# Patient Record
Sex: Male | Born: 1960 | Race: White | Hispanic: No | Smoking: Never smoker
Health system: Southern US, Community
[De-identification: ages and names within clinical notes are randomized; demographics above are authoritative.]

## PROBLEM LIST (undated history)

## (undated) DIAGNOSIS — R51 Headache: Secondary | ICD-10-CM

## (undated) DIAGNOSIS — R1900 Intra-abdominal and pelvic swelling, mass and lump, unspecified site: Secondary | ICD-10-CM

## (undated) DIAGNOSIS — R519 Headache, unspecified: Secondary | ICD-10-CM

## (undated) DIAGNOSIS — K56609 Unspecified intestinal obstruction, unspecified as to partial versus complete obstruction: Secondary | ICD-10-CM

## (undated) DIAGNOSIS — I82409 Acute embolism and thrombosis of unspecified deep veins of unspecified lower extremity: Secondary | ICD-10-CM

## (undated) DIAGNOSIS — C189 Malignant neoplasm of colon, unspecified: Secondary | ICD-10-CM

---

## 1996-09-12 HISTORY — PX: KNEE ARTHROSCOPY: SHX127

## 2002-04-14 ENCOUNTER — Encounter: Payer: Self-pay | Admitting: *Deleted

## 2002-04-14 ENCOUNTER — Emergency Department (HOSPITAL_COMMUNITY): Admission: EM | Admit: 2002-04-14 | Discharge: 2002-04-14 | Payer: Self-pay | Admitting: *Deleted

## 2002-04-17 ENCOUNTER — Emergency Department (HOSPITAL_COMMUNITY): Admission: EM | Admit: 2002-04-17 | Discharge: 2002-04-17 | Payer: Self-pay | Admitting: Emergency Medicine

## 2015-06-27 ENCOUNTER — Ambulatory Visit (INDEPENDENT_AMBULATORY_CARE_PROVIDER_SITE_OTHER): Payer: Self-pay | Admitting: Family Medicine

## 2015-06-27 VITALS — BP 120/89 | HR 112 | Temp 97.9°F | Resp 20 | Ht 67.0 in | Wt 177.4 lb

## 2015-06-27 DIAGNOSIS — K529 Noninfective gastroenteritis and colitis, unspecified: Secondary | ICD-10-CM

## 2015-06-27 MED ORDER — DICYCLOMINE HCL 10 MG PO CAPS: 10.0000 mg | ORAL_CAPSULE | Freq: Three times a day (TID) | ORAL | Status: DC | PRN

## 2015-06-27 MED ORDER — ONDANSETRON 8 MG PO TBDP: 8.0000 mg | ORAL_TABLET | Freq: Three times a day (TID) | ORAL | Status: DC | PRN

## 2015-06-27 NOTE — Patient Instructions (Signed)

## 2015-06-27 NOTE — Progress Notes (Addendum)
@[facility] @  This chart was scribed for Robyn Haber, MD by Thea Alken, ED Scribe. This patient was seen in room 10 and the patient's care was started at 11:49 AM.  Patient ID: Jesse Murray MRN: [id number], DOB: 09-01-1961, 54 y.o. Date of Encounter: 06/27/2015, 11:54 AM  Primary Physician: No primary care provider on file.  Chief Complaint:  Chief Complaint  Patient presents with   GI Problem    C/O diarrhea, vomiting, & cramps since Thursday. Tried Pepto    HPI: 54 y.o. year old male with history below presents with abdominal cramping. Pt states for the past 2 days he's had abdominal cramping, bloating, nausea, emesis, watery diarrhea, fatigue and sweats. Pt has only being able to keep a couple gulps of water down. He has tried pepto bismol wihtout relief to symptoms. He's had sick contacts at work. He denies fever, cough, and urinary problems.    No past medical history on file.   Home Meds: Prior to Admission medications   Not on File    Allergies: No Known Allergies  Social History   Social History   Marital Status: Legally Separated    Spouse Name: N/A   Number of Children: N/A   Years of Education: N/A   Occupational History   Not on file.   Social History Main Topics   Smoking status: Never Smoker    Smokeless tobacco: Not on file   Alcohol Use: No   Drug Use: No   Sexual Activity: Not on file   Other Topics Concern   Not on file   Social History Narrative   No narrative on file     Review of Systems: Constitutional: negative for chills, fever, night sweats, weight changes, or fatigue  HEENT: negative for vision changes, hearing loss, congestion, rhinorrhea, ST, epistaxis, or sinus pressure Cardiovascular: negative for chest pain or palpitations Respiratory: negative for hemoptysis, wheezing, shortness of breath, or cough Abdominal: negative for constipation.  Dermatological: negative for rash Neurologic: negative for headache,  dizziness, or syncope All other systems reviewed and are otherwise negative with the exception to those above and in the HPI.   Physical Exam: Blood pressure 120/89, pulse 112, temperature 97.9 F (36.6 C), temperature source Oral, resp. rate 20, height 5\' 7"  (1.702 m), weight 177 lb 6 oz (80.457 kg), SpO2 98 %., Body mass index is 27.77 kg/(m^2). General: Well developed, well nourished, in no acute distress. Head: Normocephalic, atraumatic, eyes without discharge, sclera non-icteric, nares are without discharge. Bilateral auditory canals clear, TM's are without perforation, pearly grey and translucent with reflective cone of light bilaterally. Oral cavity moist, posterior pharynx without exudate, erythema, peritonsillar abscess, or post nasal drip.  Neck: Supple. No thyromegaly. Full ROM. No lymphadenopathy. Lungs: Clear bilaterally to auscultation without wheezes, rales, or rhonchi. Breathing is unlabored. Heart: RRR with S1 S2. No murmurs, rubs, or gallops appreciated. Abdomen: Soft, High pitched bowel sounds with some bloating. Minimally tender diffusely.  No hepatomegaly. No rebound/guarding. No obvious abdominal masses. Msk:  Strength and tone normal for age. Extremities/Skin: Warm and dry. No clubbing or cyanosis. No edema. No rashes or suspicious lesions. Neuro: Alert and oriented X 3. Moves all extremities spontaneously. Gait is normal. CNII-XII grossly in tact. Psych:  Responds to questions appropriately with a normal affect.   Labs:   ASSESSMENT AND PLAN:  54 y.o. year old male with acute nausea, vomiting and diarrhea.  Able to keep clear liquids down at least This chart was scribed in my presence  and reviewed by me personally.    ICD-9-CM ICD-10-CM   1. Acute gastroenteritis 558.9 K52.9 ondansetron (ZOFRAN-ODT) 8 MG disintegrating tablet     dicyclomine (BENTYL) 10 MG capsule    By signing my name below, I, Jesse Murray, attest that this documentation has been prepared under  the direction and in the presence of Robyn Haber, MD.  Electronically Signed: Thea Alken, ED Scribe. 06/27/2015. 12:01 PM.   Signed, Robyn Haber, MD 06/27/2015 11:54 AM

## 2015-07-14 ENCOUNTER — Ambulatory Visit (INDEPENDENT_AMBULATORY_CARE_PROVIDER_SITE_OTHER): Payer: Self-pay

## 2015-07-14 ENCOUNTER — Ambulatory Visit (INDEPENDENT_AMBULATORY_CARE_PROVIDER_SITE_OTHER): Payer: Self-pay | Admitting: Internal Medicine

## 2015-07-14 VITALS — BP 120/76 | HR 96 | Temp 97.7°F | Resp 20 | Ht 67.0 in | Wt 181.6 lb

## 2015-07-14 DIAGNOSIS — R1084 Generalized abdominal pain: Secondary | ICD-10-CM

## 2015-07-14 DIAGNOSIS — K5669 Other intestinal obstruction: Secondary | ICD-10-CM

## 2015-07-14 DIAGNOSIS — K56609 Unspecified intestinal obstruction, unspecified as to partial versus complete obstruction: Secondary | ICD-10-CM

## 2015-07-14 LAB — POCT CBC
Granulocyte percent: 71.3 %G (ref 37–80)
HCT, POC: 36.5 % — AB (ref 43.5–53.7)
HEMOGLOBIN: 12.3 g/dL — AB (ref 14.1–18.1)
Lymph, poc: 2.5 (ref 0.6–3.4)
MCH, POC: 29.2 pg (ref 27–31.2)
MCHC: 33.8 g/dL (ref 31.8–35.4)
MCV: 86.3 fL (ref 80–97)
MID (cbc): 0.7 (ref 0–0.9)
MPV: 6.9 fL (ref 0–99.8)
POC Granulocyte: 7.9 — AB (ref 2–6.9)
POC LYMPH PERCENT: 22.7 %L (ref 10–50)
POC MID %: 6 %M (ref 0–12)
Platelet Count, POC: 216 10*3/uL (ref 142–424)
RBC: 4.22 M/uL — AB (ref 4.69–6.13)
RDW, POC: 14.3 %
WBC: 11.1 10*3/uL — AB (ref 4.6–10.2)

## 2015-07-14 NOTE — Progress Notes (Signed)
Subjective:  This chart was scribed for Jesse Lin, MD by Leandra Kern, Medical Scribe. This patient was seen in Room 3 and the patient's care was started at 4:59 PM.   Patient ID: Jesse Murray, male    DOB: [birth year], 54 y.o.   MRN: [id number]  Chief Complaint  Patient presents with  . Abdominal Pain    constipation, pain, nausea , 2 week    HPI HPI Comments: Jesse Murray is a 54 y.o. male who presents to [facility] complaining of abdominal pain, onset 2 weeks ago.  Pt was recently seen here at the office by Dr. Joseph Art on 10/15 for vomiting and diarrhea. Pt was diagnosed with acute gastroenteritis and was prescribed ondasetrom and dicyclomine. Pt indicates that the medications have not given him any relief. He reports that he still has  nausea, with vomiting during the night time, and morning, however not in the day time.  Continues with cramping abdominal pain, and weight loss 14 lbs due to experiencing symptoms when eating/ appetite loss. He states that he is not able to sleep at night due to the abdominal cramps.   Pt denies  dysuria, diarrhea, bloody stool, fever, or trouble breathing.  The dicyclomine made him constipated. He has resumed bowel movements feels like he has incomplete emptying. No further diarrhea. 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   There are no active problems to display for this patient.  he avoids medical care because he is unsure History reviewed. No pertinent past medical history. History reviewed. No pertinent past surgical history. No Known Allergies Prior to Admission medications   Medication Sig Start Date End Date Taking? Authorizing Provider  dicyclomine (BENTYL) 10 MG capsule Take 1 capsule (10 mg total) by mouth 3 (three) times daily as needed for spasms. 06/27/15  Yes Robyn Haber, MD    ondansetron (ZOFRAN-ODT) 8 MG disintegrating tablet Take 1 tablet (8 mg total) by mouth every 8 (eight) hours as needed for nausea. Patient not taking: Reported on 07/14/2015 06/27/15   Robyn Haber, MD   Social History   Social History  . Marital Status: Legally Separated    Spouse Name: N/A  . Number of Children: N/A  . Years of Education: N/A   Occupational History  .  warehouse work.   Social History Main Topics  . Smoking status: Never Smoker   . Smokeless tobacco: Not on file  . Alcohol Use: No  . Drug Use: No  . Sexual Activity: Not on file   Other Topics Concern  . Not on file   Social History Narrative    Review of Systems  Constitutional: Positive for unexpected weight change. Negative for fever.  Respiratory: Negative for shortness of breath.   Gastrointestinal: Positive for nausea, vomiting and abdominal pain. Negative for diarrhea and blood in stool.  Genitourinary: Negative for dysuria.  Psychiatric/Behavioral: Positive for sleep disturbance.       Objective:   Physical Exam  Constitutional: He is oriented to person, place, and time. He appears well-developed and well-nourished. No distress.  HENT:  Head: Normocephalic and atraumatic.  Eyes: EOM are normal. Pupils are equal, round, and reactive to light.  Neck: Neck supple.  Cardiovascular: Normal rate, regular rhythm, normal heart sounds and intact distal pulses.   No murmur heard. Pulmonary/Chest: Effort normal and breath sounds  normal. No respiratory distress. He has no wheezes. He has no rales.  Abdominal: Soft. He exhibits distension. There is no rebound and no guarding.  borborrhygimi Non tender to palpation, no masses or organomegaly.  Very tympanitic  Neurological: He is alert and oriented to person, place, and time. No cranial nerve deficit.  Skin: Skin is warm and dry.  Psychiatric: He has a normal mood and affect. His behavior is normal.  Nursing note and vitals reviewed.   Results  for orders placed or performed in visit on 07/14/15  POCT CBC  Result Value Ref Range   WBC 11.1 (A) 4.6 - 10.2 K/uL   Lymph, poc 2.5 0.6 - 3.4   POC LYMPH PERCENT 22.7 10 - 50 %L   MID (cbc) 0.7 0 - 0.9   POC MID % 6.0 0 - 12 %M   POC Granulocyte 7.9 (A) 2 - 6.9   Granulocyte percent 71.3 37 - 80 %G   RBC 4.22 (A) 4.69 - 6.13 M/uL   Hemoglobin 12.3 (A) 14.1 - 18.1 g/dL   HCT, POC 36.5 (A) 43.5 - 53.7 %   MCV 86.3 80 - 97 fL   MCH, POC 29.2 27 - 31.2 pg   MCHC 33.8 31.8 - 35.4 g/dL   RDW, POC 14.3 %   Platelet Count, POC 216 142 - 424 K/uL   MPV 6.9 0 - 99.8 fL    [facility] (PRIMARY) x-ray report read by Dr. [name], MD: Abdomen- There are numerous air fluid levels on the upright film.    BP 120/76 mmHg  Pulse 96  Temp(Src) 97.7 F (36.5 C) (Oral)  Resp 20  Ht 5\' 7"  (1.702 m)  Wt 181 lb 9.6 oz (82.373 kg)  BMI 28.44 kg/m2  SpO2 99%     Assessment & Plan:  Generalized abdominal pain -with  Mild leukocytosis slight anemia  SBO (small bowel obstruction) (HCC)  secondary to intestinal infection and then dicyclomine  Partial, as he is having bowel movements  --- I recommended CT of the abdomen for complete diagnosis but he feels that he has  2 refused this because of the expense/// he also declines ER referral and referral for admission for the same reason  He will agree to start a clear fluids only diet in advance to full fluids slowly. He will not eat solids until he has no further cramping or distention. He will use glycerin suppositories to relieve any constipation. If he develops fever or worsening abdominal pain or is unable to hold down fluids he will go to the emergency room for further treatment. CMET and amylase pending  He certainly needs colonoscopy in f/u  But will wait until he is insured next year   By signing my name below, I, Rawaa Al Rifaie, attest that this documentation has been prepared under the direction and in the presence of Jesse Lin,  MD.  Leandra Kern, Medical Scribe. 07/14/2015.  5:07 PM.  I have completed the patient encounter in its entirety as documented by the scribe, with editing by me where necessary. Aneira Cavitt P. Laney Pastor, M.D.

## 2015-07-15 ENCOUNTER — Telehealth: Payer: Self-pay

## 2015-07-15 LAB — AMYLASE: AMYLASE: 35 U/L (ref 0–105)

## 2015-07-15 LAB — COMPREHENSIVE METABOLIC PANEL
ALT: 14 U/L (ref 9–46)
AST: 19 U/L (ref 10–35)
Albumin: 3.7 g/dL (ref 3.6–5.1)
Alkaline Phosphatase: 199 U/L — ABNORMAL HIGH (ref 40–115)
BUN: 16 mg/dL (ref 7–25)
CHLORIDE: 99 mmol/L (ref 98–110)
CO2: 25 mmol/L (ref 20–31)
Calcium: 8.6 mg/dL (ref 8.6–10.3)
Creat: 0.69 mg/dL — ABNORMAL LOW (ref 0.70–1.33)
Glucose, Bld: 89 mg/dL (ref 65–99)
Potassium: 4.7 mmol/L (ref 3.5–5.3)
Sodium: 137 mmol/L (ref 135–146)
Total Bilirubin: 0.8 mg/dL (ref 0.2–1.2)
Total Protein: 6.9 g/dL (ref 6.1–8.1)

## 2015-07-15 NOTE — Telephone Encounter (Signed)
Pt would like to speak with Dr Laney Pastor regarding his visit. Please call [phone]

## 2015-07-16 ENCOUNTER — Telehealth: Payer: Self-pay | Admitting: Family Medicine

## 2015-07-16 NOTE — Telephone Encounter (Signed)
LMOM to CB to get more details.

## 2015-07-16 NOTE — Telephone Encounter (Signed)
Left vmail. Alk phos elevated which can be attributed to his bowel obstruction. With his worsening of pain should go to the ER to avoid serious or fatal consequences.

## 2015-07-16 NOTE — Telephone Encounter (Signed)
Pt called to inquire about his labwork. He is still having a lot of cramping and stomach pain. Explained that the labwork needed to reviewed by a provider, as Dr. Laney Pastor will not be here today, and that we would be contacting him by the end of the day. Please advise.

## 2015-07-16 NOTE — Telephone Encounter (Signed)
Pt calling again asking about his labwork, he is in pain and wants to know what to do next. Please advise

## 2015-07-20 ENCOUNTER — Encounter: Payer: Self-pay | Admitting: Internal Medicine

## 2015-07-21 ENCOUNTER — Telehealth: Payer: Self-pay | Admitting: Internal Medicine

## 2015-07-21 NOTE — Telephone Encounter (Signed)
Pt does not have ins Left message on machine to call back

## 2015-07-21 NOTE — Telephone Encounter (Signed)
Any followup would be additional charge---but he might consider the [facility] to see if he qualifies for free care

## 2015-07-21 NOTE — Telephone Encounter (Signed)
Patient returned call about his lab results. There was not a clinical staff member available to help the patient so I gave him the following information from Dr. Laney Pastor: "Notes Recorded by Leandrew Koyanagi, MD on 07/20/2015 at 9:17 AM Call --labs ok --one liver /gall bladder test slightly high and needs repeating when he is well. If not well at this point needs recheck and possible CT abdomen."  **Patient understood his results however he is very concerned about his liver/gall test. He also wants to know if there will be an additional charge for him to come in and have a repeat test done. Can someone please call this patient to advise him? He requests that we call him back today at his work number [phone]. An alternate number is [phone].

## 2015-07-23 ENCOUNTER — Telehealth: Payer: Self-pay | Admitting: Internal Medicine

## 2015-07-23 NOTE — Telephone Encounter (Signed)
Left message advising pt. 

## 2015-07-23 NOTE — Telephone Encounter (Signed)
Please see previous phone message on this patient. He called screaming and frustrated that he has not received a phone call about his lab results. He states that he also received a bill in the mail and that he is not going to pay it until he speaks to someone about his results.   [phone]

## 2015-07-24 NOTE — Telephone Encounter (Signed)
Pt was notified of lab results this morning.

## 2015-07-29 ENCOUNTER — Telehealth: Payer: Self-pay | Admitting: Internal Medicine

## 2015-07-29 DIAGNOSIS — R1084 Generalized abdominal pain: Secondary | ICD-10-CM

## 2015-07-29 DIAGNOSIS — K56600 Partial intestinal obstruction, unspecified as to cause: Secondary | ICD-10-CM

## 2015-07-29 DIAGNOSIS — R748 Abnormal levels of other serum enzymes: Secondary | ICD-10-CM

## 2015-07-29 NOTE — Telephone Encounter (Signed)
Patient states that he needs antibiotics for an intestinal problem he is having. Please call patient to obtain more information. He did not want to speak with me about any of his concerns.   [phone]

## 2015-07-29 NOTE — Telephone Encounter (Signed)
Patient called again at 9:58am. He states that he is going to keep calling every hour until he is able to speak to Dr. Laney Pastor about this.

## 2015-07-29 NOTE — Telephone Encounter (Signed)
Pt is refusing any advice stating he knows what is wrong with him. I explained that xrys show blockage, he is adamant that it is NOT blockage and that he knows it is an infection and nothing else, He is requesting antibiotics. Dr. Laney Pastor spoke with him and advised a referral to gastroenterologist. Pt agreeable. Referral put in.

## 2015-07-29 NOTE — Telephone Encounter (Signed)
Called work and home both//left message at both He does not need antibiotic He has intestinal obstruction He needs to see ER for scan before anything else done He has no insurance and is refusing to go He has been offered the name of [facility] as lowcost clinic We can refer to gi if he wishes but this will take several days to get in so er best choice

## 2015-07-29 NOTE — Telephone Encounter (Signed)
See multiple calls And see last OV He wants an antibiotic called in but i say he needs gi eval for abd pain with an xray looking like partial obstruction and an elevated alk phos in someone who has avoided all routine care He reluctantly accepts this argument

## 2015-07-29 NOTE — Telephone Encounter (Addendum)
Patient called again at 10:56am requesting to speak with Dr. Laney Pastor. He said he either needs an antibiotic or a referral and that it better get done soon. Please advise.

## 2015-07-30 NOTE — Telephone Encounter (Signed)
The referral has been sent to [facility] work queue.  The referral is marked as urgent, and they will schedule based on that urgency.

## 2015-07-31 ENCOUNTER — Encounter: Payer: Self-pay | Admitting: Physician Assistant

## 2015-07-31 NOTE — Telephone Encounter (Signed)
Patient called again this morning about getting a GI referral. Please call back at [phone].

## 2015-08-19 ENCOUNTER — Ambulatory Visit: Payer: Self-pay | Admitting: Physician Assistant

## 2015-08-26 ENCOUNTER — Emergency Department (HOSPITAL_COMMUNITY): Payer: Medicaid Other

## 2015-08-26 ENCOUNTER — Inpatient Hospital Stay (HOSPITAL_COMMUNITY)
Admission: EM | Admit: 2015-08-26 | Discharge: 2015-10-14 | DRG: 329 | Disposition: E | Payer: Medicaid Other | Attending: Family Medicine | Admitting: Family Medicine

## 2015-08-26 ENCOUNTER — Encounter (HOSPITAL_COMMUNITY): Payer: Self-pay | Admitting: *Deleted

## 2015-08-26 DIAGNOSIS — Z808 Family history of malignant neoplasm of other organs or systems: Secondary | ICD-10-CM

## 2015-08-26 DIAGNOSIS — I639 Cerebral infarction, unspecified: Secondary | ICD-10-CM | POA: Diagnosis not present

## 2015-08-26 DIAGNOSIS — Z86718 Personal history of other venous thrombosis and embolism: Secondary | ICD-10-CM | POA: Diagnosis not present

## 2015-08-26 DIAGNOSIS — I9589 Other hypotension: Secondary | ICD-10-CM | POA: Diagnosis not present

## 2015-08-26 DIAGNOSIS — C259 Malignant neoplasm of pancreas, unspecified: Secondary | ICD-10-CM | POA: Diagnosis present

## 2015-08-26 DIAGNOSIS — Z7189 Other specified counseling: Secondary | ICD-10-CM

## 2015-08-26 DIAGNOSIS — C786 Secondary malignant neoplasm of retroperitoneum and peritoneum: Secondary | ICD-10-CM | POA: Diagnosis present

## 2015-08-26 DIAGNOSIS — Z932 Ileostomy status: Secondary | ICD-10-CM

## 2015-08-26 DIAGNOSIS — D62 Acute posthemorrhagic anemia: Secondary | ICD-10-CM | POA: Diagnosis not present

## 2015-08-26 DIAGNOSIS — I472 Ventricular tachycardia: Secondary | ICD-10-CM | POA: Diagnosis not present

## 2015-08-26 DIAGNOSIS — M79604 Pain in right leg: Secondary | ICD-10-CM

## 2015-08-26 DIAGNOSIS — I2699 Other pulmonary embolism without acute cor pulmonale: Secondary | ICD-10-CM | POA: Diagnosis present

## 2015-08-26 DIAGNOSIS — N179 Acute kidney failure, unspecified: Secondary | ICD-10-CM | POA: Diagnosis present

## 2015-08-26 DIAGNOSIS — K7290 Hepatic failure, unspecified without coma: Secondary | ICD-10-CM | POA: Diagnosis not present

## 2015-08-26 DIAGNOSIS — I82403 Acute embolism and thrombosis of unspecified deep veins of lower extremity, bilateral: Secondary | ICD-10-CM

## 2015-08-26 DIAGNOSIS — D649 Anemia, unspecified: Secondary | ICD-10-CM

## 2015-08-26 DIAGNOSIS — R410 Disorientation, unspecified: Secondary | ICD-10-CM

## 2015-08-26 DIAGNOSIS — R531 Weakness: Secondary | ICD-10-CM | POA: Insufficient documentation

## 2015-08-26 DIAGNOSIS — R29810 Facial weakness: Secondary | ICD-10-CM | POA: Diagnosis not present

## 2015-08-26 DIAGNOSIS — C800 Disseminated malignant neoplasm, unspecified: Secondary | ICD-10-CM

## 2015-08-26 DIAGNOSIS — C772 Secondary and unspecified malignant neoplasm of intra-abdominal lymph nodes: Secondary | ICD-10-CM | POA: Diagnosis present

## 2015-08-26 DIAGNOSIS — R188 Other ascites: Secondary | ICD-10-CM | POA: Diagnosis present

## 2015-08-26 DIAGNOSIS — D6489 Other specified anemias: Secondary | ICD-10-CM | POA: Diagnosis present

## 2015-08-26 DIAGNOSIS — K5669 Other intestinal obstruction: Principal | ICD-10-CM | POA: Diagnosis present

## 2015-08-26 DIAGNOSIS — I824Y9 Acute embolism and thrombosis of unspecified deep veins of unspecified proximal lower extremity: Secondary | ICD-10-CM | POA: Insufficient documentation

## 2015-08-26 DIAGNOSIS — D6869 Other thrombophilia: Secondary | ICD-10-CM | POA: Diagnosis present

## 2015-08-26 DIAGNOSIS — J189 Pneumonia, unspecified organism: Secondary | ICD-10-CM | POA: Diagnosis not present

## 2015-08-26 DIAGNOSIS — C787 Secondary malignant neoplasm of liver and intrahepatic bile duct: Secondary | ICD-10-CM | POA: Diagnosis present

## 2015-08-26 DIAGNOSIS — C19 Malignant neoplasm of rectosigmoid junction: Secondary | ICD-10-CM | POA: Diagnosis present

## 2015-08-26 DIAGNOSIS — G8324 Monoplegia of upper limb affecting left nondominant side: Secondary | ICD-10-CM | POA: Diagnosis not present

## 2015-08-26 DIAGNOSIS — E43 Unspecified severe protein-calorie malnutrition: Secondary | ICD-10-CM | POA: Diagnosis present

## 2015-08-26 DIAGNOSIS — E871 Hypo-osmolality and hyponatremia: Secondary | ICD-10-CM | POA: Diagnosis present

## 2015-08-26 DIAGNOSIS — R109 Unspecified abdominal pain: Secondary | ICD-10-CM | POA: Diagnosis present

## 2015-08-26 DIAGNOSIS — C801 Malignant (primary) neoplasm, unspecified: Secondary | ICD-10-CM

## 2015-08-26 DIAGNOSIS — Z801 Family history of malignant neoplasm of trachea, bronchus and lung: Secondary | ICD-10-CM

## 2015-08-26 DIAGNOSIS — Z781 Physical restraint status: Secondary | ICD-10-CM | POA: Diagnosis not present

## 2015-08-26 DIAGNOSIS — R0682 Tachypnea, not elsewhere classified: Secondary | ICD-10-CM | POA: Insufficient documentation

## 2015-08-26 DIAGNOSIS — K56609 Unspecified intestinal obstruction, unspecified as to partial versus complete obstruction: Secondary | ICD-10-CM

## 2015-08-26 DIAGNOSIS — G893 Neoplasm related pain (acute) (chronic): Secondary | ICD-10-CM | POA: Diagnosis present

## 2015-08-26 DIAGNOSIS — F05 Delirium due to known physiological condition: Secondary | ICD-10-CM | POA: Diagnosis present

## 2015-08-26 DIAGNOSIS — I82413 Acute embolism and thrombosis of femoral vein, bilateral: Secondary | ICD-10-CM | POA: Diagnosis present

## 2015-08-26 DIAGNOSIS — I739 Peripheral vascular disease, unspecified: Secondary | ICD-10-CM | POA: Diagnosis present

## 2015-08-26 DIAGNOSIS — Y95 Nosocomial condition: Secondary | ICD-10-CM | POA: Diagnosis not present

## 2015-08-26 DIAGNOSIS — C7972 Secondary malignant neoplasm of left adrenal gland: Secondary | ICD-10-CM | POA: Diagnosis present

## 2015-08-26 DIAGNOSIS — Z515 Encounter for palliative care: Secondary | ICD-10-CM

## 2015-08-26 DIAGNOSIS — M79605 Pain in left leg: Secondary | ICD-10-CM

## 2015-08-26 DIAGNOSIS — D696 Thrombocytopenia, unspecified: Secondary | ICD-10-CM | POA: Diagnosis not present

## 2015-08-26 DIAGNOSIS — C189 Malignant neoplasm of colon, unspecified: Secondary | ICD-10-CM | POA: Diagnosis present

## 2015-08-26 DIAGNOSIS — Z6829 Body mass index (BMI) 29.0-29.9, adult: Secondary | ICD-10-CM | POA: Diagnosis not present

## 2015-08-26 DIAGNOSIS — R1900 Intra-abdominal and pelvic swelling, mass and lump, unspecified site: Secondary | ICD-10-CM

## 2015-08-26 DIAGNOSIS — C799 Secondary malignant neoplasm of unspecified site: Secondary | ICD-10-CM

## 2015-08-26 DIAGNOSIS — R600 Localized edema: Secondary | ICD-10-CM

## 2015-08-26 DIAGNOSIS — Z66 Do not resuscitate: Secondary | ICD-10-CM | POA: Diagnosis not present

## 2015-08-26 DIAGNOSIS — L89152 Pressure ulcer of sacral region, stage 2: Secondary | ICD-10-CM | POA: Diagnosis not present

## 2015-08-26 DIAGNOSIS — C183 Malignant neoplasm of hepatic flexure: Secondary | ICD-10-CM | POA: Insufficient documentation

## 2015-08-26 DIAGNOSIS — I471 Supraventricular tachycardia: Secondary | ICD-10-CM | POA: Diagnosis not present

## 2015-08-26 DIAGNOSIS — C785 Secondary malignant neoplasm of large intestine and rectum: Secondary | ICD-10-CM | POA: Diagnosis present

## 2015-08-26 DIAGNOSIS — R64 Cachexia: Secondary | ICD-10-CM | POA: Diagnosis present

## 2015-08-26 DIAGNOSIS — Z933 Colostomy status: Secondary | ICD-10-CM

## 2015-08-26 DIAGNOSIS — R0902 Hypoxemia: Secondary | ICD-10-CM | POA: Diagnosis not present

## 2015-08-26 DIAGNOSIS — R509 Fever, unspecified: Secondary | ICD-10-CM

## 2015-08-26 HISTORY — DX: Acute embolism and thrombosis of unspecified deep veins of unspecified lower extremity: I82.409

## 2015-08-26 HISTORY — DX: Headache, unspecified: R51.9

## 2015-08-26 HISTORY — DX: Unspecified intestinal obstruction, unspecified as to partial versus complete obstruction: K56.609

## 2015-08-26 HISTORY — DX: Headache: R51

## 2015-08-26 HISTORY — DX: Intra-abdominal and pelvic swelling, mass and lump, unspecified site: R19.00

## 2015-08-26 HISTORY — DX: Malignant neoplasm of colon, unspecified: C18.9

## 2015-08-26 LAB — CBC WITH DIFFERENTIAL/PLATELET
BASOS ABS: 0.1 10*3/uL (ref 0.0–0.1)
BASOS PCT: 0 %
EOS ABS: 0.4 10*3/uL (ref 0.0–0.7)
Eosinophils Relative: 3 %
HEMATOCRIT: 29.4 % — AB (ref 39.0–52.0)
HEMOGLOBIN: 9.7 g/dL — AB (ref 13.0–17.0)
Lymphocytes Relative: 16 %
Lymphs Abs: 2.1 10*3/uL (ref 0.7–4.0)
MCH: 28.5 pg (ref 26.0–34.0)
MCHC: 33 g/dL (ref 30.0–36.0)
MCV: 86.5 fL (ref 78.0–100.0)
MONOS PCT: 6 %
Monocytes Absolute: 0.9 10*3/uL (ref 0.1–1.0)
NEUTROS ABS: 10.1 10*3/uL — AB (ref 1.7–7.7)
NEUTROS PCT: 75 %
Platelets: 185 10*3/uL (ref 150–400)
RBC: 3.4 MIL/uL — AB (ref 4.22–5.81)
RDW: 15.8 % — ABNORMAL HIGH (ref 11.5–15.5)
WBC: 13.6 10*3/uL — AB (ref 4.0–10.5)

## 2015-08-26 LAB — HEPARIN LEVEL (UNFRACTIONATED): HEPARIN UNFRACTIONATED: 0.11 [IU]/mL — AB (ref 0.30–0.70)

## 2015-08-26 LAB — APTT: APTT: 81 s — AB (ref 24–37)

## 2015-08-26 LAB — COMPREHENSIVE METABOLIC PANEL
ALK PHOS: 989 U/L — AB (ref 38–126)
ALT: 30 U/L (ref 17–63)
ANION GAP: 12 (ref 5–15)
AST: 49 U/L — ABNORMAL HIGH (ref 15–41)
Albumin: 3 g/dL — ABNORMAL LOW (ref 3.5–5.0)
BUN: 29 mg/dL — ABNORMAL HIGH (ref 6–20)
CALCIUM: 8.8 mg/dL — AB (ref 8.9–10.3)
CO2: 26 mmol/L (ref 22–32)
CREATININE: 1.31 mg/dL — AB (ref 0.61–1.24)
Chloride: 94 mmol/L — ABNORMAL LOW (ref 101–111)
Glucose, Bld: 113 mg/dL — ABNORMAL HIGH (ref 65–99)
Potassium: 3.9 mmol/L (ref 3.5–5.1)
SODIUM: 132 mmol/L — AB (ref 135–145)
TOTAL PROTEIN: 7.4 g/dL (ref 6.5–8.1)
Total Bilirubin: 1.9 mg/dL — ABNORMAL HIGH (ref 0.3–1.2)

## 2015-08-26 LAB — LIPASE, BLOOD: LIPASE: 31 U/L (ref 11–51)

## 2015-08-26 LAB — POC OCCULT BLOOD, ED: FECAL OCCULT BLD: NEGATIVE

## 2015-08-26 LAB — MAGNESIUM: MAGNESIUM: 2.2 mg/dL (ref 1.7–2.4)

## 2015-08-26 LAB — PROTIME-INR
INR: 3.21 — AB (ref 0.00–1.49)
PROTHROMBIN TIME: 32.2 s — AB (ref 11.6–15.2)

## 2015-08-26 LAB — PHOSPHORUS: Phosphorus: 4 mg/dL (ref 2.5–4.6)

## 2015-08-26 MED ORDER — ONDANSETRON HCL 4 MG/2ML IJ SOLN: 4.0000 mg | Freq: Four times a day (QID) | INTRAMUSCULAR | Status: DC | PRN

## 2015-08-26 MED ORDER — MORPHINE SULFATE (PF) 4 MG/ML IV SOLN: 4.0000 mg | INTRAVENOUS | Status: DC | PRN

## 2015-08-26 MED ORDER — ONDANSETRON HCL 4 MG/2ML IJ SOLN
4.0000 mg | Freq: Once | INTRAMUSCULAR | Status: AC
  Administered 2015-08-26: 4 mg via INTRAVENOUS
  Filled 2015-08-26: qty 2

## 2015-08-26 MED ORDER — IOHEXOL 300 MG/ML  SOLN
100.0000 mL | Freq: Once | INTRAMUSCULAR | Status: AC | PRN
  Administered 2015-08-26: 100 mL via INTRAVENOUS

## 2015-08-26 MED ORDER — DEXTROSE-NACL 5-0.9 % IV SOLN
INTRAVENOUS | Status: DC
  Administered 2015-08-26: 15:00:00 via INTRAVENOUS
  Administered 2015-08-27 (×2): 1000 mL via INTRAVENOUS
  Administered 2015-08-28 (×2): via INTRAVENOUS
  Administered 2015-08-29: 1000 mL via INTRAVENOUS

## 2015-08-26 MED ORDER — SODIUM CHLORIDE 0.9 % IV SOLN: INTRAVENOUS | Status: DC

## 2015-08-26 MED ORDER — HYDROMORPHONE HCL 1 MG/ML IJ SOLN
1.0000 mg | INTRAMUSCULAR | Status: DC | PRN
  Administered 2015-08-26 – 2015-08-27 (×6): 1 mg via INTRAVENOUS
  Filled 2015-08-26 (×6): qty 1

## 2015-08-26 MED ORDER — HEPARIN (PORCINE) IN NACL 100-0.45 UNIT/ML-% IJ SOLN
1700.0000 [IU]/h | INTRAMUSCULAR | Status: AC
  Administered 2015-08-26: 1200 [IU]/h via INTRAVENOUS
  Administered 2015-08-27: 1400 [IU]/h via INTRAVENOUS
  Administered 2015-08-27: 1700 [IU]/h via INTRAVENOUS
  Filled 2015-08-26 (×3): qty 250

## 2015-08-26 MED ORDER — ONDANSETRON HCL 4 MG PO TABS: 4.0000 mg | ORAL_TABLET | Freq: Four times a day (QID) | ORAL | Status: DC | PRN

## 2015-08-26 MED ORDER — PANTOPRAZOLE SODIUM 40 MG IV SOLR
40.0000 mg | Freq: Every day | INTRAVENOUS | Status: DC
  Administered 2015-08-26 – 2015-09-01 (×7): 40 mg via INTRAVENOUS
  Filled 2015-08-26 (×7): qty 40

## 2015-08-26 MED ORDER — MORPHINE SULFATE (PF) 4 MG/ML IV SOLN
4.0000 mg | Freq: Once | INTRAVENOUS | Status: AC
  Administered 2015-08-26: 4 mg via INTRAVENOUS
  Filled 2015-08-26: qty 1

## 2015-08-26 MED ORDER — MORPHINE SULFATE (PF) 2 MG/ML IV SOLN
2.0000 mg | INTRAVENOUS | Status: DC | PRN
  Administered 2015-08-26: 2 mg via INTRAVENOUS
  Filled 2015-08-26: qty 1

## 2015-08-26 MED ORDER — SODIUM CHLORIDE 0.9 % IV BOLUS (SEPSIS)
1000.0000 mL | Freq: Once | INTRAVENOUS | Status: AC
  Administered 2015-08-26: 1000 mL via INTRAVENOUS

## 2015-08-26 NOTE — Consult Note (Signed)
Reason for Consult:  Stage IV obstructing colon cancer Referring Physician: Dr. Davonna Belling  Jesse Murray is an 54 y.o. male.  HPI: Jesse Murray presents to the ED with abdominal pain RLQ and swelling from his feet to his testes.  Jesse Murray has had abdominal pain going back to 06/27/15.  Jesse Murray was treated for gastroenteritis.  Jesse Murray did not improve and returned to the Urgent Care on 07/14/15, with ongoing nausea, vomiting at night, abdominal cramping and weight loss.  Jesse Murray was referred for CT at that point but deferred because Jesse Murray had no way to pay for it.  Plain films obtained at that point showed:  Distended small bowel loops are noted with air-fluid levels highly suspicious for small bowel obstruction. No free abdominal air. Paucity of bowel gas within lower abdomen.  Jesse Murray has become progressively worse, and refused to come to the ED or go to [facility] up till now, because Jesse Murray had no insurance to pay for it.  Jesse Murray talked to a doctor in his church and it was thought Jesse Murray might have C diff colitis and Jesse Murray was given a week of Flagyl.  Jesse Murray said that made his symptoms worse.  Over the last 24 hours abdominal cramping and lower leg swelling has become worse.  This led him to come to the ED today.  Jesse Murray was also concerned about RLE/foot swelling a month ago which Jesse Murray attributed to a broken foot after Jesse Murray dropped a box of ammunition at work on his foot.  Jesse Murray has been in bed for the last 5-6 days because Jesse Murray felt so bad.  Jesse Murray reports a 30-40 pound weight loss since symptom started in October 2016.  Work up in the ED included films showing:  Findings worrisome for intestinal volvulus, potentially a cecal Volvulus.  CXR shows no acute changes. CT scan shows and obstructing tumor of the colon, hepatic flexure 55 x 66 x 46 mm.  Surrounding irregular mesenteric nodal disease. Associated ipsilateral and contralateral retroperitoneal nodal disease, up to 17 mm short axis at the level of the left main renal vessels. Widespread hepatic metastatic disease.  Hepatic metastases individually up to 5.5 cm diameter. This cecum is massively distended. The terminal ileum and distal small bowel are obstructed as expected and measure up to 4-1/2 cm diameter.  The tumor is inseparable from the second portion of the duodenum on coronal. The duodenum otherwise appears within normal limits. 11 mm hypodense lesion in the spleen. The pancreas is within normal limits. 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  Bilateral common femoral venous thrombosis suspected. Other major vascular structures appear patent.  Small indeterminate lung base nodules, indeterminate lesions in the spleen, and right posterior pelvic musculature.   Labs show Na 132, creatinine is up to 1.31 Alk phos is up to 989, bilirubin is up to 1.9.  WBC is up and Jesse Murray has mild anemia.  We are ask to see.  History reviewed. No pertinent past medical history.  History reviewed. No pertinent past surgical history.  History reviewed. No pertinent family history.  Social History:  reports that Jesse Murray has never smoked. Jesse Murray does not have any smokeless tobacco history on file. Jesse Murray reports that Jesse Murray does not drink alcohol or use illicit drugs.  Allergies: No Known Allergies  Prior to Admission medications   Not on File     Results for orders placed or performed during the hospital encounter of 08/27/2015 (from the past 48  hour(s))  Comprehensive metabolic panel     Status: Abnormal   Collection Time: 08/25/2015  9:40 AM  Result Value Ref Range   Sodium 132 (L) 135 - 145 mmol/L   Potassium 3.9 3.5 - 5.1 mmol/L   Chloride 94 (L) 101 - 111 mmol/L   CO2 26 22 - 32 mmol/L   Glucose, Bld 113 (H) 65 - 99 mg/dL   BUN 29 (H) 6 - 20 mg/dL   Creatinine, Ser 1.31 (H) 0.61 - 1.24 mg/dL   Calcium 8.8 (L) 8.9 - 10.3 mg/dL   Total Protein 7.4 6.5 - 8.1 g/dL   Albumin 3.0 (L) 3.5 - 5.0 g/dL   AST 49 (H) 15 - 41 U/L   ALT 30 17 - 63 U/L   Alkaline Phosphatase 989 (H) 38 - 126 U/L    Total Bilirubin 1.9 (H) 0.3 - 1.2 mg/dL   GFR calc non Af Amer >60 >60 mL/min   GFR calc Af Amer >60 >60 mL/min    Comment: (NOTE) The eGFR has been calculated using the CKD EPI equation. This calculation has not been validated in all clinical situations. eGFR's persistently <60 mL/min signify possible Chronic Kidney Disease.    Anion gap 12 5 - 15  CBC with Differential     Status: Abnormal   Collection Time: 08/28/2015  9:40 AM  Result Value Ref Range   WBC 13.6 (H) 4.0 - 10.5 K/uL   RBC 3.40 (L) 4.22 - 5.81 MIL/uL   Hemoglobin 9.7 (L) 13.0 - 17.0 g/dL   HCT 29.4 (L) 39.0 - 52.0 %   MCV 86.5 78.0 - 100.0 fL   MCH 28.5 26.0 - 34.0 pg   MCHC 33.0 30.0 - 36.0 g/dL   RDW 15.8 (H) 11.5 - 15.5 %   Platelets 185 150 - 400 K/uL   Neutrophils Relative % 75 %   Neutro Abs 10.1 (H) 1.7 - 7.7 K/uL   Lymphocytes Relative 16 %   Lymphs Abs 2.1 0.7 - 4.0 K/uL   Monocytes Relative 6 %   Monocytes Absolute 0.9 0.1 - 1.0 K/uL   Eosinophils Relative 3 %   Eosinophils Absolute 0.4 0.0 - 0.7 K/uL   Basophils Relative 0 %   Basophils Absolute 0.1 0.0 - 0.1 K/uL  Lipase, blood     Status: None   Collection Time: 08/20/2015  9:40 AM  Result Value Ref Range   Lipase 31 11 - 51 U/L  POC occult blood, ED RN will collect     Status: None   Collection Time: 08/29/2015 12:05 PM  Result Value Ref Range   Fecal Occult Bld NEGATIVE NEGATIVE    Dg Chest 2 View  09/06/2015  CLINICAL DATA:  54 year old male with right lower quadrant abdominal pain and lower extremity swelling since last week. Nausea and vomiting for 2 weeks. Weakness. Initial encounter. EXAM: CHEST  2 VIEW COMPARISON:  CT Abdomen and Pelvis from today reported separately, and earlier. FINDINGS: Lung volumes are within normal limits. Normal cardiac size and mediastinal contours. No pneumothorax or pulmonary edema. No pleural effusion or consolidation. No pulmonary nodule identified. No acute osseous abnormality identified. Abnormal bowel gas  in the abdomen, see comparison. IMPRESSION: No acute cardiopulmonary abnormality. See CT Abdomen and Pelvis from today reported separately. Electronically Signed   By: Genevie Ann M.D.   On: 08/16/2015 11:21   Dg Abd 1 View  08/23/2015  CLINICAL DATA:  Right abdominal pain and distention for 2 weeks. Constipation. EXAM: ABDOMEN -  1 VIEW COMPARISON:  07/14/2015 FINDINGS: There is marked distention of a loop of bowel, presumably colon. This finding is associated with a relative paucity of remaining bowel gas with a minimal amount of air seen within several loops of small bowel with index loop of small bowel in the left upper abdominal quadrant with index loop of presumably small bowel within the left upper abdominal quadrant measuring approximately 4.3 cm in diameter. Nondiagnostic evaluation for pneumoperitoneum secondary to exclusion of the lower thorax. No definite pneumatosis or portal venous gas. No definite abnormal intra-abdominal calcifications. IMPRESSION: Findings worrisome for intestinal volvulus, potentially a cecal volvulus. Further evaluation with abdominal CT could be performed as indicated. Critical Value/emergent results were called by telephone at the time of interpretation on 08/29/2015 at 10:04 am to Dr. Davonna Belling , who verbally acknowledged these results. Electronically Signed   By: Sandi Mariscal M.D.   On: 09/07/2015 10:07   Ct Abdomen Pelvis W Contrast  08/22/2015  ADDENDUM REPORT: 08/21/2015 11:52 ADDENDUM: Study discussed by telephone with Dr. Davonna Belling on 08/18/2015 at 1132 hours. Electronically Signed   By: Genevie Ann M.D.   On: 09/08/2015 11:52  09/12/2015  CLINICAL DATA:  53 year old male with abdominal pain for 2 weeks and lower extremity swelling greater on the right. Unintentional weight loss for 8 weeks. Abnormal bowel gas pattern today. Initial encounter. EXAM: CT ABDOMEN AND PELVIS WITH CONTRAST TECHNIQUE: Multidetector CT imaging of the abdomen and pelvis was  performed using the standard protocol following bolus administration of intravenous contrast. CONTRAST:  131m OMNIPAQUE IOHEXOL 300 MG/ML  SOLN COMPARISON:  Radiographs from today and earlier. FINDINGS: There are a small number of subtle lung base pulmonary nodules (left lung series 3, images 1 and 2). There is a 12 mm more irregular pulmonary nodule in the right lateral costophrenic angle. No pleural effusion. No pericardial effusion. No destructive or suspicious osseous lesion identified. However, there is a a suspicious small right posterior iliac muscle mass measuring 11 mm on series 2, image 69. No abdominal free air. There is trace abdominal free fluid primarily in the right gutter. There is an obstructing tumor of the colon at the hepatic flexure with indistinct margins suggesting mesenteric invasion and adjacent mesenteric nodal disease. The mass encompasses 55 x 66 x 46 mm (AP by transverse by CC). Surrounding irregular mesenteric nodal disease. Associated ipsilateral and contralateral retroperitoneal nodal disease, up to 17 mm short axis at the level of the left main renal vessels. Widespread hepatic metastatic disease. Hepatic metastases individually up to 5.5 cm diameter. This cecum is massively distended. The terminal ileum and distal small bowel are obstructed as expected and measure up to 4-1/2 cm diameter. Distal to the obstructing hepatic flexure tumor the colon is decompressed, with diverticulosis. Diminutive urinary bladder. Diminutive stomach. The tumor is inseparable from the second portion of the duodenum on coronal. The duodenum otherwise appears within normal limits. There is a small indeterminate 11 mm hypodense lesion in the spleen. The pancreas is within normal limits. 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 The kidneys are within normal limits. The portal venous system is patent. Major arterial structures in the abdomen and pelvis are patent.  There is an indistinct appearance of both common femoral veins in the pelvis on series 2, image 85. Internal all decreased density at both. No inguinal lymphadenopathy. The IVC is patent. The common iliac veins are patent on the delayed images. No pelvic  lymphadenopathy. IMPRESSION: 1. 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 Extensive hepatic metastases. Extensive nodal metastases. Left adrenal metastasis. 2. Severely obstructed cecum and distal small bowel. No free air. Trace free fluid. 3. Bilateral common femoral venous thrombosis suspected. Other major vascular structures appear patent. 4. Small indeterminate lung base nodules, indeterminate lesions in the spleen, and right posterior pelvic musculature. Electronically Signed: By: Odessa Fleming M.D. On: 08/21/2015 11:30    Review of Systems  Constitutional: Positive for fever (Jesse Murray isn't sure), chills (some) and weight loss (30-40 pounds since Oct 2016).  HENT: Negative for congestion, ear discharge, ear pain, hearing loss, nosebleeds, sore throat and tinnitus.   Eyes: Negative.   Respiratory: Positive for shortness of breath (DOE with walking 20 feet). Negative for cough, hemoptysis, sputum production, wheezing and stridor.   Cardiovascular: Positive for leg swelling (legs started swelling before Thanksgiving, and Jesse Murray thought it was from his Aleve).  Gastrointestinal: Positive for heartburn (occasionally), nausea, vomiting, abdominal pain (cramping that seems to come and go), diarrhea (Jesse Murray was treated by a doctor in his church with Flagyl for C diff, Jesse Murray says that made him worse.) and constipation (Jesse Murray started with diarrhea, and medicine for gastroenteritis made him worse.). Negative for blood in stool and melena.       Nausea and vomiting on and off since Oct 2016.  Worse if Jesse Murray eats in the PM.  Jesse Murray has never had a colonoscopy.  Genitourinary: Negative.   Musculoskeletal: Negative.   Skin: Negative.   Neurological: Positive for  headaches (headache now after news today.).  Endo/Heme/Allergies: Negative.   Psychiatric/Behavioral: Negative.    Blood pressure 115/75, pulse 96, temperature 97.9 F (36.6 C), temperature source Oral, height 5\' 8"  (1.727 m), weight 68.04 kg (150 lb), SpO2 100 %. Physical Exam  Constitutional: Jesse Murray is oriented to person, place, and time. Jesse Murray appears well-developed and well-nourished. No distress.  HENT:  Head: Normocephalic and atraumatic.  Nose: Nose normal.  Eyes: Conjunctivae and EOM are normal. Right eye exhibits no discharge. Left eye exhibits no discharge. No scleral icterus.  Neck: Neck supple. No JVD present. No tracheal deviation present. No thyromegaly present.  Cardiovascular: Regular rhythm, normal heart sounds and intact distal pulses.   No murmur heard. A little tachy when I saw him. BP 122/81 mmHg  Pulse 98  Temp(Src) 97.9 F (36.6 C) (Oral)  Ht 5\' 8"  (1.727 m)  Wt 68.04 kg (150 lb)  BMI 22.81 kg/m2  SpO2 100%   Respiratory: Effort normal and breath sounds normal. No respiratory distress. Jesse Murray has no wheezes. Jesse Murray has no rales. Jesse Murray exhibits no tenderness.  GI: Soft. Bowel sounds are normal. Jesse Murray exhibits distension. Jesse Murray exhibits no mass. There is no tenderness. There is no rebound and no guarding.  You can feel his cecum, Jesse Murray says it's like a big bubble that comes and goes.  Jesse Murray says Jesse Murray will pass some gas or hear gurgling and it will get smaller.  Genitourinary: Guaiac positive stool (this was sent by the ED physician and reported to me when they called for consult). No penile tenderness.  Penis and testes are not swollen  Musculoskeletal: Jesse Murray exhibits edema. Jesse Murray exhibits no tenderness.  Right leg is swollen, I don't think the left is very swollen at all.  Good distal pulses.  Lymphadenopathy:    Jesse Murray has no cervical adenopathy.  Neurological: Jesse Murray is alert and oriented to person, place, and time. No cranial nerve deficit.  Skin: Skin is warm  and dry. No rash noted. Jesse Murray is not  diaphoretic. No erythema. No pallor.  Psychiatric: Jesse Murray has a normal mood and affect. His behavior is normal. Judgment and thought content normal.    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    Plan:  i would agree with heparin for now.  NG placement for decompression.  Dr. Ninfa Linden will review the CT later this afternoon and make recommendations on Surgical plan.  We would most like try to get the tumor out, but it would depend on what is possible at time of surgery. Jesse Murray may just end up with some type of diverting colostomy.  I would get coag studies, CEA CA 19, AFP tonight. Keep him NPO.  Jesse Murray has not eaten much for over a month so Jesse Murray is significantly malnourished and deconditioned.  Jesse Murray 08/31/2015, 12:47 PM

## 2015-08-26 NOTE — ED Provider Notes (Signed)
CSN: [id number]     Arrival date & time 08/31/2015  0908 History   First MD Initiated Contact with Patient 08/29/2015 [phone]     Chief Complaint  Patient presents with  . Leg Swelling     (Consider location/radiation/quality/duration/timing/severity/associated sxs/prior Treatment) The history is provided by the patient.   patient presents with abdominal pain nausea vomiting some diarrhea. Also a swelling in his right leg. Around a month and a half ago he was seen in urgent care twice for the diarrhea and vomiting. On the second visit x-ray shows small bowel loops which was worrisome for bowel obstruction. Patient refused admission to hospital or CT scan. States since then his continue to do worse. His abdomen has felt full. States that over the last 2 months he has lost 50 pounds. States he cannot eat. Denies chest pain. Denies trouble breathing. States he is more fatigued. He does now have a swelling of the right leg also. His upper and lower leg. No history of this. No fevers. Denies blood in the stool.  Past Medical History  Diagnosis Date  . DVT (deep venous thrombosis) ([place]) dx'd ~ 08/22/2015    RLE  . Small bowel obstruction ([place]) 09/09/2015  . Abdominal mass 08/13/2015  . Headache     "weekly" (09/12/2015)  . Colon cancer ([place]) dx'd 08/25/2015    stage IV/notes 08/18/2015   Past Surgical History  Procedure Laterality Date  . Knee arthroscopy Left 1998    cartilage repair   Family History  Problem Relation Age of Onset  . Deep vein thrombosis Father 33  . Melanoma Father   . Lung cancer Paternal Grandfather   . Cirrhosis Paternal Grandfather   . Heart attack    . Diabetes     Social History  Substance Use Topics  . Smoking status: Never Smoker   . Smokeless tobacco: Never Used  . Alcohol Use: 0.0 oz/week    0 Standard drinks or equivalent per week     Comment: history of heavy alcohol abuse from 2003-2010; quit drinking in 2010    Review of Systems  Constitutional:  Positive for activity change, appetite change, fatigue and unexpected weight change.  Respiratory: Negative for shortness of breath.   Cardiovascular: Positive for leg swelling. Negative for chest pain.  Gastrointestinal: Positive for nausea, vomiting, abdominal pain, diarrhea and abdominal distention. Negative for blood in stool.  Genitourinary: Negative for flank pain.  Musculoskeletal: Negative for gait problem.  Skin: Positive for color change.  Neurological: Negative for weakness and light-headedness.  Psychiatric/Behavioral: Negative for confusion.      Allergies  Review of patient's allergies indicates no known allergies.  Home Medications   Prior to Admission medications   Not on File   BP 114/73 mmHg  Pulse 83  Temp(Src) 98 F (36.7 C) (Oral)  Resp 18  Ht 5\' 8"  (1.727 m)  Wt 160 lb 8 oz (72.802 kg)  BMI 24.41 kg/m2  SpO2 98% Physical Exam  Constitutional: He appears well-developed.  HENT:  Head: Atraumatic.  Eyes: Pupils are equal, round, and reactive to light.  Cardiovascular: Normal rate.   Pulmonary/Chest: Effort normal. He has no rales.  Abdominal: He exhibits distension. There is tenderness.  Somewhat diffuse fullness. Distention with upper tenderness. Patient states is worse in right upper quadrant. No hernias palpated.  Genitourinary: Penis normal.  No hernia palpated  Musculoskeletal: He exhibits edema.  Large on edema to right lower extremity. Goes from the groin all the way down to foot.  Skin: Skin is warm.    ED Course  Procedures (including critical care time) Labs Review Labs Reviewed  COMPREHENSIVE METABOLIC PANEL - Abnormal; Notable for the following:    Sodium 132 (*)    Chloride 94 (*)    Glucose, Bld 113 (*)    BUN 29 (*)    Creatinine, Ser 1.31 (*)    Calcium 8.8 (*)    Albumin 3.0 (*)    AST 49 (*)    Alkaline Phosphatase 989 (*)    Total Bilirubin 1.9 (*)    All other components within normal limits  CBC WITH  DIFFERENTIAL/PLATELET - Abnormal; Notable for the following:    WBC 13.6 (*)    RBC 3.40 (*)    Hemoglobin 9.7 (*)    HCT 29.4 (*)    RDW 15.8 (*)    Neutro Abs 10.1 (*)    All other components within normal limits  HEPARIN LEVEL (UNFRACTIONATED) - Abnormal; Notable for the following:    Heparin Unfractionated 0.11 (*)    All other components within normal limits  CEA - Abnormal; Notable for the following:    CEA 107.3 (*)    All other components within normal limits  APTT - Abnormal; Notable for the following:    aPTT 81 (*)    All other components within normal limits  PROTIME-INR - Abnormal; Notable for the following:    Prothrombin Time 32.2 (*)    INR 3.21 (*)    All other components within normal limits  CANCER ANTIGEN 19-9 - Abnormal; Notable for the following:    CA 19-9 29262 (*)    All other components within normal limits  CBC - Abnormal; Notable for the following:    RBC 3.02 (*)    Hemoglobin 8.4 (*)    HCT 26.5 (*)    RDW 16.0 (*)    All other components within normal limits  PREALBUMIN - Abnormal; Notable for the following:    Prealbumin 7.6 (*)    All other components within normal limits  BASIC METABOLIC PANEL - Abnormal; Notable for the following:    Glucose, Bld 114 (*)    Calcium 7.9 (*)    All other components within normal limits  HEPARIN LEVEL (UNFRACTIONATED) - Abnormal; Notable for the following:    Heparin Unfractionated 0.17 (*)    All other components within normal limits  PROTIME-INR - Abnormal; Notable for the following:    Prothrombin Time 27.8 (*)    INR 2.64 (*)    All other components within normal limits  HEPATIC FUNCTION PANEL - Abnormal; Notable for the following:    Total Protein 5.3 (*)    Albumin 2.2 (*)    Alkaline Phosphatase 702 (*)    Total Bilirubin 1.6 (*)    Bilirubin, Direct 0.9 (*)    All other components within normal limits  LIPASE, BLOOD  MAGNESIUM  PHOSPHORUS  AFP TUMOR MARKER  HEPARIN LEVEL (UNFRACTIONATED)   CBC  PROTIME-INR  POC OCCULT BLOOD, ED  TYPE AND SCREEN  ABO/RH    Imaging Review Dg Chest 2 View  09/10/2015  CLINICAL DATA:  54 year old male with right lower quadrant abdominal pain and lower extremity swelling since last week. Nausea and vomiting for 2 weeks. Weakness. Initial encounter. EXAM: CHEST  2 VIEW COMPARISON:  CT Abdomen and Pelvis from today reported separately, and earlier. FINDINGS: Lung volumes are within normal limits. Normal cardiac size and mediastinal contours. No pneumothorax or pulmonary edema. No pleural effusion  or consolidation. No pulmonary nodule identified. No acute osseous abnormality identified. Abnormal bowel gas in the abdomen, see comparison. IMPRESSION: No acute cardiopulmonary abnormality. See CT Abdomen and Pelvis from today reported separately. Electronically Signed   By: Genevie Ann M.D.   On: 08/16/2015 11:21   Dg Abd 1 View  08/21/2015  CLINICAL DATA:  Right abdominal pain and distention for 2 weeks. Constipation. EXAM: ABDOMEN - 1 VIEW COMPARISON:  07/14/2015 FINDINGS: There is marked distention of a loop of bowel, presumably colon. This finding is associated with a relative paucity of remaining bowel gas with a minimal amount of air seen within several loops of small bowel with index loop of small bowel in the left upper abdominal quadrant with index loop of presumably small bowel within the left upper abdominal quadrant measuring approximately 4.3 cm in diameter. Nondiagnostic evaluation for pneumoperitoneum secondary to exclusion of the lower thorax. No definite pneumatosis or portal venous gas. No definite abnormal intra-abdominal calcifications. IMPRESSION: Findings worrisome for intestinal volvulus, potentially a cecal volvulus. Further evaluation with abdominal CT could be performed as indicated. Critical Value/emergent results were called by telephone at the time of interpretation on 09/11/2015 at 10:04 am to Dr. Davonna Belling , who verbally  acknowledged these results. Electronically Signed   By: Sandi Mariscal M.D.   On: 08/24/2015 10:07   Ct Abdomen Pelvis W Contrast  09/04/2015  ADDENDUM REPORT: 08/31/2015 11:52 ADDENDUM: Study discussed by telephone with Dr. Davonna Belling on 08/14/2015 at 1132 hours. Electronically Signed   By: Genevie Ann M.D.   On: 08/16/2015 11:52  09/11/2015  CLINICAL DATA:  53 year old male with abdominal pain for 2 weeks and lower extremity swelling greater on the right. Unintentional weight loss for 8 weeks. Abnormal bowel gas pattern today. Initial encounter. EXAM: CT ABDOMEN AND PELVIS WITH CONTRAST TECHNIQUE: Multidetector CT imaging of the abdomen and pelvis was performed using the standard protocol following bolus administration of intravenous contrast. CONTRAST:  163mL OMNIPAQUE IOHEXOL 300 MG/ML  SOLN COMPARISON:  Radiographs from today and earlier. FINDINGS: There are a small number of subtle lung base pulmonary nodules (left lung series 3, images 1 and 2). There is a 12 mm more irregular pulmonary nodule in the right lateral costophrenic angle. No pleural effusion. No pericardial effusion. No destructive or suspicious osseous lesion identified. However, there is a a suspicious small right posterior iliac muscle mass measuring 11 mm on series 2, image 69. No abdominal free air. There is trace abdominal free fluid primarily in the right gutter. There is an obstructing tumor of the colon at the hepatic flexure with indistinct margins suggesting mesenteric invasion and adjacent mesenteric nodal disease. The mass encompasses 55 x 66 x 46 mm (AP by transverse by CC). Surrounding irregular mesenteric nodal disease. Associated ipsilateral and contralateral retroperitoneal nodal disease, up to 17 mm short axis at the level of the left main renal vessels. Widespread hepatic metastatic disease. Hepatic metastases individually up to 5.5 cm diameter. This cecum is massively distended. The terminal ileum and distal small bowel  are obstructed as expected and measure up to 4-1/2 cm diameter. Distal to the obstructing hepatic flexure tumor the colon is decompressed, with diverticulosis. Diminutive urinary bladder. Diminutive stomach. The tumor is inseparable from the second portion of the duodenum on coronal. The duodenum otherwise appears within normal limits. There is a small indeterminate 11 mm hypodense lesion in the spleen. The pancreas is within normal limits. There is a left adrenal metastasis measuring up to 18 mm  diameter by 32 mm in length. The kidneys are within normal limits. The portal venous system is patent. Major arterial structures in the abdomen and pelvis are patent. There is an indistinct appearance of both common femoral veins in the pelvis on series 2, image 85. Internal all decreased density at both. No inguinal lymphadenopathy. The IVC is patent. The common iliac veins are patent on the delayed images. No pelvic lymphadenopathy. IMPRESSION: 1. 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 Extensive hepatic metastases. Extensive nodal metastases. Left adrenal metastasis. 2. Severely obstructed cecum and distal small bowel. No free air. Trace free fluid. 3. Bilateral common femoral venous thrombosis suspected. Other major vascular structures appear patent. 4. Small indeterminate lung base nodules, indeterminate lesions in the spleen, and right posterior pelvic musculature. Electronically Signed: By: Genevie Ann M.D. On: 09/12/2015 11:30   I have personally reviewed and evaluated these images and lab results as part of my medical decision-making.   EKG Interpretation None      MDM   Final diagnoses:  Malignant neoplasm of hepatic flexure (HCC)  Metastatic cancer (HCC)  Acute deep vein thrombosis (DVT) of proximal vein of lower extremity, unspecified laterality (HCC)  Anemia, unspecified anemia type     patient with nausea vomiting abdominal pain and weight loss. Has been going on for  couple months. Initial story is worrisome for malignancy and CT scan likely verifies this. Does not have a primary care doctor. Also has likely clots and both lower extremities. As the new anemia also. Will admit for further workup.    Davonna Belling, MD 08/27/15 [phone]

## 2015-08-26 NOTE — Progress Notes (Signed)
Family Medicine Teaching Service Daily Progress Note Intern Pager: [phone]  Patient name: Jesse Murray Medical record number: [id number] Date of birth: 08-21-1961 Age: 54 y.o. Gender: male  Primary Care Provider: No PCP Per Patient Consultants: surgery Code Status: FULL   Assessment and Plan: Jesse Murray is a 54 y.o. male presenting with abdominal pain, as well as bilateral leg swelling and pain. PMH is significant for EtOH abuse.   #Abdominal Pain: Worsening abdominal pain and bloating for past two months 2/2 to colonic mass causing obstruction and cecal dilatation. FOBT negative. AST minimally elevated, ALT WNL, alk phos very elevated at 989 (likely due to liver metastasis). Abdominal xray shows possible intestinal volvulus. CXR with no acute cardiopulmonary abnormality. 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 Also suspicious for bilateral common femoral venous thromboses. Currently stable and does not appear in need of immediate surgical intervention. CEA and CA 19-9 significant elevated. Surgical recommendation is ex lap with attempts at resection and palliative ostomy.  - NG tube placed for decompression - Palliative care consulted; desire early involvement due to extensive nature of patient's condition and no support system.  - Nutrition consult for malnourishment  - PT/OT consult when appropriate for deconditioning - Pending patient wishes consider oncology consult - Surgery to discuss operation with patient again today  #Leg pain/swelling: Most likely DVT due to hypercoagulable state from malignancy and/or recent inactivity after lying in bed for three weeks. Well's criteria for DVT 7 (high risk for DVT). Well's criteria for PE 5.5 (moderate risk). Physical exam also consistent with DVT. No improvement in pain/swelling today but patient  denies difficulty breathing.  - Consider CTA chest to rule out PE - Receiving heparin gtt for presumed DVT - Depending on clot burden patient may be a candidate for IVC filter  #Anemia: Hgb 9.7 on admission (12.3 one month ago). May be related to malignancy. FOBT negative. Patient currently denying bleeding or any symptoms of anemia. Denies any bleeding. Vitals are stable. PT elevated at 32, INR elevated at 3.2, APTT elevated at 81. Hgb this AM decreased to 8.4. - Monitor  - Transfusion threshold <7  #AKI: Unsure of baseline Cr but one month ago was 0.69. On admission 1.31. Patient did receive IV contrast in ED for CT scan. Also with decreased PO intake likely a prerenal etiology as well. BUN/Cr ratio >20. Cr improved to 0.91 this AM.  - Avoid nephrotoxic agents including contrast - IV hydration  - I/Os  FEN/GI: NPO, Protonix IV, D5NS '@125ml'$ /hr Prophylaxis: heparin gtt   Subjective:  Patient reports continued pain today, primarily abdominal, but also pain in his legs and R shoulder. He reports that Dilaudid alleviates his pain for ~45 min then wears off. He has Dilaudid PRN q2 hrs but has only been using it about every four hours. He denies N/V or any other complaints this AM.   Objective: Temp:  [97.9 F (36.6 C)-98.6 F (37 C)] 98.1 F (36.7 C) (12/15 0427) Pulse Rate:  [76-98] 77 (12/15 0427) Resp:  [18-20] 18 (12/15 0427) BP: (95-140)/(58-87) 106/64 mmHg (12/15 0427) SpO2:  [96 %-100 %] 100 % (12/15 0427) Weight:  [150 lb (68.04 kg)-160 lb 8 oz (72.802 kg)] 160 lb 8 oz (72.802 kg) (12/14 1358) Physical Exam: General: lying in bed in NAD Cardiovascular: RRR, no murmurs appreciated Respiratory: CTAB, no wheezing, no increased WOB Abdomen: slightly distended,  significant hepatomegaly, diffusely TTP Extremities: 1+ pitting edema to knee on RLE with 2+ pitting edema of foot to ankle, palpable cords and positive Homan's sign, ecchymosis mostly on thigh near groin; trace edema but  no TTP on LLE, negative Homan's, no cords palpated  Laboratory:  Recent Labs Lab 08/17/2015 0940 08/27/15 0540  WBC 13.6* 8.7  HGB 9.7* 8.4*  HCT 29.4* 26.5*  PLT 185 156    Recent Labs Lab 09/10/2015 0940 08/27/15 0540  NA 132* 136  K 3.9 4.1  CL 94* 104  CO2 26 25  BUN 29* 16  CREATININE 1.31* 0.91  CALCIUM 8.8* 7.9*  PROT 7.4  --   BILITOT 1.9*  --   ALKPHOS 989*  --   ALT 30  --   AST 49*  --   GLUCOSE 113* 114*    Imaging/Diagnostic Tests: Dg Chest 2 View  08/18/2015  CLINICAL DATA:  54 year old male with right lower quadrant abdominal pain and lower extremity swelling since last week. Nausea and vomiting for 2 weeks. Weakness. Initial encounter. EXAM: CHEST  2 VIEW COMPARISON:  CT Abdomen and Pelvis from today reported separately, and earlier. FINDINGS: Lung volumes are within normal limits. Normal cardiac size and mediastinal contours. No pneumothorax or pulmonary edema. No pleural effusion or consolidation. No pulmonary nodule identified. No acute osseous abnormality identified. Abnormal bowel gas in the abdomen, see comparison. IMPRESSION: No acute cardiopulmonary abnormality. See CT Abdomen and Pelvis from today reported separately. Electronically Signed   By: Genevie Ann M.D.   On: 08/30/2015 11:21   Dg Abd 1 View  09/12/2015  CLINICAL DATA:  Right abdominal pain and distention for 2 weeks. Constipation. EXAM: ABDOMEN - 1 VIEW COMPARISON:  07/14/2015 FINDINGS: There is marked distention of a loop of bowel, presumably colon. This finding is associated with a relative paucity of remaining bowel gas with a minimal amount of air seen within several loops of small bowel with index loop of small bowel in the left upper abdominal quadrant with index loop of presumably small bowel within the left upper abdominal quadrant measuring approximately 4.3 cm in diameter. Nondiagnostic evaluation for pneumoperitoneum secondary to exclusion of the lower thorax. No definite pneumatosis or  portal venous gas. No definite abnormal intra-abdominal calcifications. IMPRESSION: Findings worrisome for intestinal volvulus, potentially a cecal volvulus. Further evaluation with abdominal CT could be performed as indicated. Critical Value/emergent results were called by telephone at the time of interpretation on 09/11/2015 at 10:04 am to Dr. Davonna Belling , who verbally acknowledged these results. Electronically Signed   By: Sandi Mariscal M.D.   On: 08/25/2015 10:07   Ct Abdomen Pelvis W Contrast  08/17/2015  ADDENDUM REPORT: 09/09/2015 11:52 ADDENDUM: Study discussed by telephone with Dr. Davonna Belling on 08/14/2015 at 1132 hours. Electronically Signed   By: Genevie Ann M.D.   On: 08/18/2015 11:52  08/30/2015  CLINICAL DATA:  54 year old male with abdominal pain for 2 weeks and lower extremity swelling greater on the right. Unintentional weight loss for 8 weeks. Abnormal bowel gas pattern today. Initial encounter. EXAM: CT ABDOMEN AND PELVIS WITH CONTRAST TECHNIQUE: Multidetector CT imaging of the abdomen and pelvis was performed using the standard protocol following bolus administration of intravenous contrast. CONTRAST:  152m OMNIPAQUE IOHEXOL 300 MG/ML  SOLN COMPARISON:  Radiographs from today and earlier. FINDINGS: There are a small number of subtle lung base pulmonary nodules (left lung series 3, images 1 and 2). There is a 12 mm more irregular pulmonary nodule in  the right lateral costophrenic angle. No pleural effusion. No pericardial effusion. No destructive or suspicious osseous lesion identified. However, there is a a suspicious small right posterior iliac muscle mass measuring 11 mm on series 2, image 69. No abdominal free air. There is trace abdominal free fluid primarily in the right gutter. There is an obstructing tumor of the colon at the hepatic flexure with indistinct margins suggesting mesenteric invasion and adjacent mesenteric nodal disease. The mass encompasses 55 x 66 x 46 mm (AP by  transverse by CC). Surrounding irregular mesenteric nodal disease. Associated ipsilateral and contralateral retroperitoneal nodal disease, up to 17 mm short axis at the level of the left main renal vessels. Widespread hepatic metastatic disease. Hepatic metastases individually up to 5.5 cm diameter. This cecum is massively distended. The terminal ileum and distal small bowel are obstructed as expected and measure up to 4-1/2 cm diameter. Distal to the obstructing hepatic flexure tumor the colon is decompressed, with diverticulosis. Diminutive urinary bladder. Diminutive stomach. The tumor is inseparable from the second portion of the duodenum on coronal. The duodenum otherwise appears within normal limits. There is a small indeterminate 11 mm hypodense lesion in the spleen. The pancreas is within normal limits. 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 The kidneys are within normal limits. The portal venous system is patent. Major arterial structures in the abdomen and pelvis are patent. There is an indistinct appearance of both common femoral veins in the pelvis on series 2, image 85. Internal all decreased density at both. No inguinal lymphadenopathy. The IVC is patent. The common iliac veins are patent on the delayed images. No pelvic lymphadenopathy. IMPRESSION: 1. 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 Extensive hepatic metastases. Extensive nodal metastases. Left adrenal metastasis. 2. Severely obstructed cecum and distal small bowel. No free air. Trace free fluid. 3. Bilateral common femoral venous thrombosis suspected. Other major vascular structures appear patent. 4. Small indeterminate lung base nodules, indeterminate lesions in the spleen, and right posterior pelvic musculature. Electronically Signed: By: Genevie Ann M.D. On: 08/20/2015 11:30    Verner Mould, MD 08/27/2015, 8:46 AM PGY-1, [place] Intern pager: [phone], text pages welcome

## 2015-08-26 NOTE — H&P (Signed)
[facility] Admission History and Physical Service Pager: [phone]  Patient name: Jesse Murray Medical record number: [id number] Date of birth: Jul 16, 1961 Age: 54 y.o. Gender: male  Primary Care Provider: No primary care provider on file. Consultants: surgery Code Status: FULL (discussed on admission)  Chief Complaint: abdominal pain, leg swelling and pain  Assessment and Plan: Jesse Murray is a 54 y.o. male presenting with abdominal pain, as well as bilateral leg swelling and pain. PMH is significant for EtOH abuse.   #Abdominal Pain: Worsening abdominal pain and bloating for past two months. FOBT negative. AST minimally elevated, ALT WNL, alk phos very elevated at 989 (likely due to liver metastasis). Abdominal xray shows possible intestinal volvulus. CXR with no acute cardiopulmonary abnormality. 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 Also suspicious for bilateral common femoral venous thromboses, and shows small indeterminate lung base nodules, indeterminate lesions in spleen, and right posterior pelvic musculature. Abdominal pain 2/2 right colon mass with obstruction and massive cecal dilatation. Patient has no prior history of clots or medical conditions. Currently stable and does not appear in need of immediate surgical intervention. Vitals are stable.   - Admit to floor, attending Dr. Gwendlyn Deutscher - NG tube placed for decompression - F/u CEA, AFP, CA 19-9, pre-albumin - Surgical consult, appreciate recommendations - Palliative care consulted; want to get them involved early due to extensive nature of patient's condition and no support system.   - obtain nutrition consult for malnourishment  - PT/OT consult when appropriate for deconditioning - pending patient wishes consider oncology consult  #Leg pain/swelling:  most likely DVT due to hypercoagulable state from malignancy and/or recent inactivity after lying in bed for three weeks. Well's criteria for DVT 7 (high risk for DVT). Physical exam also consistent with DVT. - Lower extremity Doppler US ordered - Consider CTA chest to rule out PE - Well score for PE 5.5 (moderate risk) - receiving heparin gtt for presumed DVT - depending on clot burden patient may be a candidate for IVC filter   #Anemia: Hgb 9.7 on admission (12.3 one month ago). May be related to malignancy. FOBT negative. Patient currently denying any symptoms of anemia. Denies any bleeding. Vitals are stable.  - Monitor  - CBC in AM - F/u PT-INR - transfusion threshold <7  #AKI: Unsure of baseline Cr but one month ago was .69. On admission 1.31. Patient did receive IV contract in ED for CT scan. Also with decreased PO intake likely a prerenal etiology as well. BUN/Cr ratio >20. -avoid nephrotoxic agents including contrast -IV hydration  -repeat labs in AM -I/Os  #Hyponatremia: 132 on admit -continue hydration with NS -recheck in AM  FEN/GI: NPO, Protonix IV, D5NS _0 /hr Prophylaxis: heparin gtt   Disposition: admit to med-surg  History of Present Illness:  Jesse Murray is a 54 y.o. male presenting with abdominal pain and bilateral leg pain and swelling.  Patient reports abdominal pain beginning two months ago. He went to urgent care at this time and was told he had Norovirus, for which he was prescribed an unknown medication. He said he did not improve after taking this medication, so he presented to urgent care again. This time he was told he had an intestinal blockage, and should not eat for three days. He followed these instructions, but then developed diarrhea. Since this point, he has had diarrhea  intermittently over the past ~one month. He was told by a family friend who is a physician that he probably had C.diff, and was given Flagyl. He did not finish the full course  of Flagyl, and stopped taking the medication last week. He does report passing gas, and had one hard stool yesterday. Throughout the past two months, he has also experienced abdominal bloating.   Patient also reports leg pain and swelling bilaterally. He reports that three weeks ago, he dropped a box of ammunition on his R foot. He experienced swelling from the knee down of his R leg beginning the next day, but this resolved over the next few days. Due to both abdominal and leg pain, he stopped working and primarily stayed in bed all day starting around this time. About one week ago he developed leg swelling again, this time in both legs.   Patient reports weight loss of 20-30 pounds over the past two months, despite having a regular appetite. Patient denies family or personal history of blood clots. Endorses SOB on exertion. Denies fevers, chest pain, or bleeding. Has had nausea and vomiting intermittently over the past two months.   The patient reports drinking heavily for 5-6 years, but says he stopped 8 years ago. He could not quantify the exact amount of alcohol he drank daily, but said it was "half a bottle of [place] Stalls." He reports quitting cold [place], and denies experiencing withdrawal symptoms.   Review Of Systems: Per HPI with the following additions: no HA, changes in vision, cough, nasal congestion, fevers Otherwise the remainder of the systems were negative.  Patient Active Problem List   Diagnosis Date Noted  . Colon cancer ([place]) 08/30/2015  . Abdominal pain 08/13/2015    Past Medical History: History reviewed. No pertinent past medical history.  Past Surgical History: Past Surgical History  Procedure Laterality Date  . Left knee cartilage repair  1998    Social History: Social History  Substance Use Topics  . Smoking status: Never Smoker   . Smokeless tobacco: None  . Alcohol Use: 0.0 oz/week    0 Standard drinks or equivalent per week     Comment: history of heavy  alcohol abuse but quit drinking 8 years ago   Additional social history: Lives alone; has no support system. Denies illicit drug use Please also refer to relevant sections of EMR.  Family History: Family History  Problem Relation Age of Onset  . Deep vein thrombosis Father 16  . Melanoma Father   . Lung cancer Paternal Grandfather   . Cirrhosis Paternal Grandfather   . Heart attack    . Diabetes     Allergies and Medications: No Known Allergies No current facility-administered medications on file prior to encounter.   No current outpatient prescriptions on file prior to encounter.   Objective: BP 111/71 mmHg  Pulse 91  Temp(Src) 98.6 F (37 C) (Oral)  Resp 20  Ht _0  (1.727 m)  Wt 160 lb 8 oz (72.802 kg)  BMI 24.41 kg/m2  SpO2 99% Exam: General: lying in bed in NAD Eyes: PERRLA, EOMI, no scleral icterus ENTM: MMM, no pharyngeal exudates or erythema. External ears and nose normal Neck: supple, full ROM, no thyromegaly or masses palpated Cardiovascular: RRR, no murmurs appreciated. Distal pulses intact.  Respiratory: CTAB, no wheezes, no increased work of breathing Abdomen: slightly distended, significant hepatomegaly, diffusely TTP, hypoactive bowel sounds, soft  MSK: 1+ pitting edema to knee on RLE with 2+ pitting edema of foot  to ankle, palpable cords and positive Homan's sign, ecchymosis mostly on thigh near groin; trace edema but no TTP on LLE, negative Homan's, no cords palpated Skin: no rashes or bruises noted. Warm and dry Neuro: A&Ox3, CN II-XII intact bilaterally, strength 5/5 in BUE, 4/5 in RLE and 5/5 LLE. Sensation grossly intact.  Psych: appropriate mood and affect   Labs and Imaging: Results for orders placed or performed during the hospital encounter of 08/31/2015 (from the past 24 hour(s))  Comprehensive metabolic panel     Status: Abnormal   Collection Time: 08/29/2015  9:40 AM  Result Value Ref Range   Sodium 132 (L) 135 - 145 mmol/L   Potassium 3.9  3.5 - 5.1 mmol/L   Chloride 94 (L) 101 - 111 mmol/L   CO2 26 22 - 32 mmol/L   Glucose, Bld 113 (H) 65 - 99 mg/dL   BUN 29 (H) 6 - 20 mg/dL   Creatinine, Ser 1.31 (H) 0.61 - 1.24 mg/dL   Calcium 8.8 (L) 8.9 - 10.3 mg/dL   Total Protein 7.4 6.5 - 8.1 g/dL   Albumin 3.0 (L) 3.5 - 5.0 g/dL   AST 49 (H) 15 - 41 U/L   ALT 30 17 - 63 U/L   Alkaline Phosphatase 989 (H) 38 - 126 U/L   Total Bilirubin 1.9 (H) 0.3 - 1.2 mg/dL   GFR calc non Af Amer >60 >60 mL/min   GFR calc Af Amer >60 >60 mL/min   Anion gap 12 5 - 15  CBC with Differential     Status: Abnormal   Collection Time: 08/29/2015  9:40 AM  Result Value Ref Range   WBC 13.6 (H) 4.0 - 10.5 K/uL   RBC 3.40 (L) 4.22 - 5.81 MIL/uL   Hemoglobin 9.7 (L) 13.0 - 17.0 g/dL   HCT 29.4 (L) 39.0 - 52.0 %   MCV 86.5 78.0 - 100.0 fL   MCH 28.5 26.0 - 34.0 pg   MCHC 33.0 30.0 - 36.0 g/dL   RDW 15.8 (H) 11.5 - 15.5 %   Platelets 185 150 - 400 K/uL   Neutrophils Relative % 75 %   Neutro Abs 10.1 (H) 1.7 - 7.7 K/uL   Lymphocytes Relative 16 %   Lymphs Abs 2.1 0.7 - 4.0 K/uL   Monocytes Relative 6 %   Monocytes Absolute 0.9 0.1 - 1.0 K/uL   Eosinophils Relative 3 %   Eosinophils Absolute 0.4 0.0 - 0.7 K/uL   Basophils Relative 0 %   Basophils Absolute 0.1 0.0 - 0.1 K/uL  Lipase, blood     Status: None   Collection Time: 08/25/2015  9:40 AM  Result Value Ref Range   Lipase 31 11 - 51 U/L  POC occult blood, ED RN will collect     Status: None   Collection Time: 08/29/2015 12:05 PM  Result Value Ref Range   Fecal Occult Bld NEGATIVE NEGATIVE   Dg Chest 2 View  08/27/2015  CLINICAL DATA:  54 year old male with right lower quadrant abdominal pain and lower extremity swelling since last week. Nausea and vomiting for 2 weeks. Weakness. Initial encounter. EXAM: CHEST  2 VIEW COMPARISON:  CT Abdomen and Pelvis from today reported separately, and earlier. FINDINGS: Lung volumes are within normal limits. Normal cardiac size and mediastinal contours. No  pneumothorax or pulmonary edema. No pleural effusion or consolidation. No pulmonary nodule identified. No acute osseous abnormality identified. Abnormal bowel gas in the abdomen, see comparison. IMPRESSION: No acute cardiopulmonary abnormality. See  CT Abdomen and Pelvis from today reported separately. Electronically Signed   By: Genevie Ann M.D.   On: 08/27/2015 11:21   Dg Abd 1 View  08/23/2015  CLINICAL DATA:  Right abdominal pain and distention for 2 weeks. Constipation. EXAM: ABDOMEN - 1 VIEW COMPARISON:  07/14/2015 FINDINGS: There is marked distention of a loop of bowel, presumably colon. This finding is associated with a relative paucity of remaining bowel gas with a minimal amount of air seen within several loops of small bowel with index loop of small bowel in the left upper abdominal quadrant with index loop of presumably small bowel within the left upper abdominal quadrant measuring approximately 4.3 cm in diameter. Nondiagnostic evaluation for pneumoperitoneum secondary to exclusion of the lower thorax. No definite pneumatosis or portal venous gas. No definite abnormal intra-abdominal calcifications. IMPRESSION: Findings worrisome for intestinal volvulus, potentially a cecal volvulus. Further evaluation with abdominal CT could be performed as indicated. Critical Value/emergent results were called by telephone at the time of interpretation on 08/27/2015 at 10:04 am to Dr. Davonna Belling , who verbally acknowledged these results. Electronically Signed   By: Sandi Mariscal M.D.   On: 09/09/2015 10:07   Ct Abdomen Pelvis W Contrast  08/18/2015  ADDENDUM REPORT: 09/06/2015 11:52 ADDENDUM: Study discussed by telephone with Dr. Davonna Belling on 08/19/2015 at 1132 hours. Electronically Signed   By: Genevie Ann M.D.   On: 08/30/2015 11:52  09/07/2015  CLINICAL DATA:  54 year old male with abdominal pain for 2 weeks and lower extremity swelling greater on the right. Unintentional weight loss for 8 weeks.  Abnormal bowel gas pattern today. Initial encounter. EXAM: CT ABDOMEN AND PELVIS WITH CONTRAST TECHNIQUE: Multidetector CT imaging of the abdomen and pelvis was performed using the standard protocol following bolus administration of intravenous contrast. CONTRAST:  174m OMNIPAQUE IOHEXOL 300 MG/ML  SOLN COMPARISON:  Radiographs from today and earlier. FINDINGS: There are a small number of subtle lung base pulmonary nodules (left lung series 3, images 1 and 2). There is a 12 mm more irregular pulmonary nodule in the right lateral costophrenic angle. No pleural effusion. No pericardial effusion. No destructive or suspicious osseous lesion identified. However, there is a a suspicious small right posterior iliac muscle mass measuring 11 mm on series 2, image 69. No abdominal free air. There is trace abdominal free fluid primarily in the right gutter. There is an obstructing tumor of the colon at the hepatic flexure with indistinct margins suggesting mesenteric invasion and adjacent mesenteric nodal disease. The mass encompasses 55 x 66 x 46 mm (AP by transverse by CC). Surrounding irregular mesenteric nodal disease. Associated ipsilateral and contralateral retroperitoneal nodal disease, up to 17 mm short axis at the level of the left main renal vessels. Widespread hepatic metastatic disease. Hepatic metastases individually up to 5.5 cm diameter. This cecum is massively distended. The terminal ileum and distal small bowel are obstructed as expected and measure up to 4-1/2 cm diameter. Distal to the obstructing hepatic flexure tumor the colon is decompressed, with diverticulosis. Diminutive urinary bladder. Diminutive stomach. The tumor is inseparable from the second portion of the duodenum on coronal. The duodenum otherwise appears within normal limits. There is a small indeterminate 11 mm hypodense lesion in the spleen. The pancreas is within normal limits. There is a left adrenal metastasis measuring up to 18 mm  diameter by 32 mm in length. The kidneys are within normal limits. The portal venous system is patent. Major arterial structures in the abdomen and  pelvis are patent. There is an indistinct appearance of both common femoral veins in the pelvis on series 2, image 85. Internal all decreased density at both. No inguinal lymphadenopathy. The IVC is patent. The common iliac veins are patent on the delayed images. No pelvic lymphadenopathy. IMPRESSION: 1. 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 Extensive hepatic metastases. Extensive nodal metastases. Left adrenal metastasis. 2. Severely obstructed cecum and distal small bowel. No free air. Trace free fluid. 3. Bilateral common femoral venous thrombosis suspected. Other major vascular structures appear patent. 4. Small indeterminate lung base nodules, indeterminate lesions in the spleen, and right posterior pelvic musculature. Electronically Signed: By: Genevie Ann M.D. On: 09/08/2015 11:30    Verner Mould, MD 09/10/2015, 2:32 PM PGY-1, [facility] pager: [phone], text pages welcome   FPTS Upper-Level Resident Addendum  I have independently interviewed and examined the patient. I have discussed the above with the original author and agree with their documentation. My edits for correction/addition/clarification are in pink. Please see also any attending notes.   Katheren Shams, DO PGY-2, [place] Service pager: [phone] (text pages welcome through [facility])

## 2015-08-26 NOTE — ED Notes (Signed)
Patient transported to CT 

## 2015-08-26 NOTE — Progress Notes (Signed)
New Admission Note:  Arrival Method: Ed stretcher Mental Orientation: alert and oriented Telemetry: none Assessment: Completed Skin: no issues IV: present and infusing Pain: 0 Tubes:0 Safety Measures: Safety Fall Prevention Plan was given, discussed and signed. Admission: Completed [facility] Orientation: Patient has been orientated to the room, unit and the staff. Family: not present  Orders have been reviewed and implemented. Will continue to monitor the patient. Call light has been placed within reach and bed alarm has been activated.   Fabian Sharp, RN  Phone Number: [phone]

## 2015-08-26 NOTE — Progress Notes (Signed)
ANTICOAGULATION CONSULT NOTE - Initial Consult  Pharmacy Consult for heparin Indication: DVT  No Known Allergies  Patient Measurements: Height: 5\' 8"  (172.7 cm) Weight: 150 lb (68.04 kg) IBW/kg (Calculated) : 68.4 Heparin Dosing Weight: 68kg  Vital Signs: Temp: 97.9 F (36.6 C) (12/14 0923) Temp Source: Oral (12/14 0923) BP: 110/80 mmHg (12/14 1245) Pulse Rate: 97 (12/14 1245)  Labs:  Recent Labs  08/17/2015 0940  HGB 9.7*  HCT 29.4*  PLT 185  CREATININE 1.31*    Estimated Creatinine Clearance: 62.7 mL/min (by C-G formula based on Cr of 1.31).   Assessment: Pharmacy consulted to dose heparin for possible VTE. MD requests no bolus at this time as pt may go to surgery later today. Hgb 9.7, plt 185  Goal of Therapy:  Heparin level 0.3-0.7 units/ml Monitor platelets by anticoagulation protocol: Yes   Plan:  Start heparin infusion at 1200 units/hr  6hr HL Daily CBC, HL Monitor s/sx bleeding  Judieth Keens, PharmD Clinical Pharmacy Resident 08/21/2015,1:05 PM

## 2015-08-26 NOTE — Progress Notes (Signed)
ANTICOAGULATION CONSULT NOTE  Pharmacy Consult for heparin Indication: DVT  No Known Allergies  Patient Measurements: Height: 5\' 8"  (172.7 cm) Weight: 160 lb 8 oz (72.802 kg) IBW/kg (Calculated) : 68.4 Heparin Dosing Weight: 68kg  Vital Signs: Temp: 98.1 F (36.7 C) (12/14 2010) Temp Source: Oral (12/14 2010) BP: 114/69 mmHg (12/14 2010) Pulse Rate: 82 (12/14 2010)  Labs:  Recent Labs  08/25/2015 0940 08/16/2015 1515 08/18/2015 2015  HGB 9.7*  --   --   HCT 29.4*  --   --   PLT 185  --   --   APTT  --  81*  --   LABPROT  --  32.2*  --   INR  --  3.21*  --   HEPARINUNFRC  --   --  0.11*  CREATININE 1.31*  --   --     Estimated Creatinine Clearance: 63.1 mL/min (by C-G formula based on Cr of 1.31).   Assessment: Pharmacy consulted to dose heparin for possible VTE.  Initial heparin level low at 0.11   Goal of Therapy:  Heparin level 0.3-0.7 units/ml Monitor platelets by anticoagulation protocol: Yes   Plan:  Increase heparin to 1400 units / hr  Daily CBC, HL Monitor s/sx bleeding  Thank you Anette Guarneri, PharmD [phone]  09/11/2015,9:00 PM

## 2015-08-26 NOTE — Care Management Note (Signed)
Case Management Note  Patient Details  Name: GEOGE DOUBLE MRN: [id number] Date of Birth: [birth year]  Subjective/Objective:                  Date-08/30/2015 Initial Assessment Spoke with patient at the bedside Introduced self as case manager and explained role in discharge planning and how to be reached.  Verified patient lives in [place] in home alone. Verified patient anticipates to go home alone at time of discharge.  Patient has no DME. Expressed potential need for no other DME.  Patient confirmed  needing help with their medication.  Patient drives to MD appointments.  Verified patient has no PCP. Patient states they currently receive [facility] services through no one.  .  Plan: CM will continue to follow for discharge planning and [facility] resources.   Carles Collet RN BSN CM [phone]   Action/Plan:  CM to schedule appointment with [facility]/ [facility] for follow up and med needs.   Expected Discharge Date:                  Expected Discharge Plan:  Home/Self Care  In-House Referral:     Discharge planning Services  CM Consult  Post Acute Care Choice:    Choice offered to:     DME Arranged:    DME Agency:     HH Arranged:    HH Agency:     Status of Service:  In process, will continue to follow  Medicare Important Message Given:    Date Medicare IM Given:    Medicare IM give by:    Date Additional Medicare IM Given:    Additional Medicare Important Message give by:     If discussed at [place] of Stay Meetings, dates discussed:    Additional Comments:  Carles Collet, RN 09/06/2015, 2:48 PM

## 2015-08-26 NOTE — ED Notes (Signed)
Pt hooked up and ready for transport. Surgeon at bedside and requests pt not leave at this time rt PA needs to assess pt.

## 2015-08-26 NOTE — Progress Notes (Signed)
Patient ID: Jesse Murray, male   DOB: 1961/07/05, 54 y.o.   MRN: [id number]   I have seen and examined the patient and agree with the assessment and plans outlined by our PA. This is an unfortunate situation with stage IV cancer and DVT.  As he is obstructed, he needs NG decompression.  I would plan exploratory laparotomy with attempts at resection and an ostomy for palliation.  Given the obstruction, he can not be prepped and have an anastomosis or bypass.  I explained this to him in detail and will discuss surgery with him again tomorrow.  Emmaclaire Switala A. Ninfa Linden  MD, FACS

## 2015-08-26 NOTE — Progress Notes (Signed)
Report received from ED nurse.

## 2015-08-26 NOTE — ED Notes (Signed)
Pt has c/o RLQ abdominal pain accompanied by leg swelling. Pt states he is swollen from his feet up to his groin and into his testes. Pt endorses n/v x2 weeks.

## 2015-08-27 DIAGNOSIS — Z7189 Other specified counseling: Secondary | ICD-10-CM

## 2015-08-27 DIAGNOSIS — Z515 Encounter for palliative care: Secondary | ICD-10-CM

## 2015-08-27 DIAGNOSIS — G893 Neoplasm related pain (acute) (chronic): Secondary | ICD-10-CM

## 2015-08-27 LAB — CEA: CEA: 107.3 ng/mL — AB (ref 0.0–4.7)

## 2015-08-27 LAB — HEPATIC FUNCTION PANEL
ALK PHOS: 702 U/L — AB (ref 38–126)
ALT: 22 U/L (ref 17–63)
AST: 35 U/L (ref 15–41)
Albumin: 2.2 g/dL — ABNORMAL LOW (ref 3.5–5.0)
BILIRUBIN DIRECT: 0.9 mg/dL — AB (ref 0.1–0.5)
BILIRUBIN TOTAL: 1.6 mg/dL — AB (ref 0.3–1.2)
Indirect Bilirubin: 0.7 mg/dL (ref 0.3–0.9)
Total Protein: 5.3 g/dL — ABNORMAL LOW (ref 6.5–8.1)

## 2015-08-27 LAB — BASIC METABOLIC PANEL
Anion gap: 7 (ref 5–15)
BUN: 16 mg/dL (ref 6–20)
CO2: 25 mmol/L (ref 22–32)
CREATININE: 0.91 mg/dL (ref 0.61–1.24)
Calcium: 7.9 mg/dL — ABNORMAL LOW (ref 8.9–10.3)
Chloride: 104 mmol/L (ref 101–111)
GFR calc non Af Amer: >60 mL/min (ref >60)
Glucose, Bld: 114 mg/dL — ABNORMAL HIGH (ref 65–99)
Potassium: 4.1 mmol/L (ref 3.5–5.1)
Sodium: 136 mmol/L (ref 135–145)

## 2015-08-27 LAB — ABO/RH: ABO/RH(D): O POS

## 2015-08-27 LAB — CBC
HCT: 26.5 % — ABNORMAL LOW (ref 39.0–52.0)
Hemoglobin: 8.4 g/dL — ABNORMAL LOW (ref 13.0–17.0)
MCH: 27.8 pg (ref 26.0–34.0)
MCHC: 31.7 g/dL (ref 30.0–36.0)
MCV: 87.7 fL (ref 78.0–100.0)
PLATELETS: 156 10*3/uL (ref 150–400)
RBC: 3.02 MIL/uL — ABNORMAL LOW (ref 4.22–5.81)
RDW: 16 % — AB (ref 11.5–15.5)
WBC: 8.7 10*3/uL (ref 4.0–10.5)

## 2015-08-27 LAB — CANCER ANTIGEN 19-9: CA 19 9: 29262 U/mL — AB (ref 0–35)

## 2015-08-27 LAB — PROTIME-INR
INR: 2.64 — ABNORMAL HIGH (ref 0.00–1.49)
PROTHROMBIN TIME: 27.8 s — AB (ref 11.6–15.2)

## 2015-08-27 LAB — TYPE AND SCREEN
ABO/RH(D): O POS
ANTIBODY SCREEN: NEGATIVE

## 2015-08-27 LAB — PREALBUMIN: Prealbumin: 7.6 mg/dL — ABNORMAL LOW (ref 18–38)

## 2015-08-27 LAB — HEPARIN LEVEL (UNFRACTIONATED)
HEPARIN UNFRACTIONATED: 0.3 [IU]/mL (ref 0.30–0.70)
Heparin Unfractionated: 0.17 IU/mL — ABNORMAL LOW (ref 0.30–0.70)

## 2015-08-27 LAB — AFP TUMOR MARKER: AFP-Tumor Marker: 3.5 ng/mL (ref 0.0–8.3)

## 2015-08-27 MED ORDER — HYDROMORPHONE HCL 1 MG/ML IJ SOLN
0.5000 mg | INTRAMUSCULAR | Status: DC | PRN
  Administered 2015-08-27 – 2015-08-28 (×12): 0.5 mg via INTRAVENOUS
  Filled 2015-08-27 (×12): qty 1

## 2015-08-27 MED ORDER — VITAMIN K1 10 MG/ML IJ SOLN
1.0000 mg | Freq: Once | INTRAVENOUS | Status: AC
  Administered 2015-08-27: 1 mg via INTRAVENOUS
  Filled 2015-08-27: qty 0.1

## 2015-08-27 MED ORDER — LORAZEPAM 2 MG/ML IJ SOLN
0.5000 mg | Freq: Four times a day (QID) | INTRAMUSCULAR | Status: DC | PRN
  Administered 2015-08-27 – 2015-09-02 (×12): 0.5 mg via INTRAVENOUS
  Filled 2015-08-27 (×12): qty 1

## 2015-08-27 NOTE — Progress Notes (Signed)
Family Medicine Teaching Service Daily Progress Note Intern Pager: [phone]  Patient name: Jesse Murray Medical record number: [id number] Date of birth: Apr 08, 1961 Age: 54 y.o. Gender: male  Primary Care Provider: No PCP Per Patient Consultants: surgery Code Status: FULL   Assessment and Plan: DERRALL HICKS is a 54 y.o. male presenting with abdominal pain, as well as bilateral leg swelling and pain. PMH is significant for EtOH abuse.   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 FOBT negative. AST minimally elevated, ALT WNL, alk phos very elevated at 989 (likely due to liver metastasis). Abdominal xray shows possible intestinal volvulus. CXR with no acute cardiopulmonary abnormality. 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 Also suspicious for bilateral common femoral venous thromboses. Currently stable and does not appear in need of immediate surgical intervention. CEA and CA 19-9 significant elevated. INr 12/15 2.64. INR this AM 1.38.  - NG tube placed for decompression - Palliative care continuing to follow pt - Nutrition consult for malnourishment  - PT/OT consult when appropriate for deconditioning - Pending patient wishes consider oncology consult - SW consulted regarding patient's wishes to change will - said they will contact [place] who help patients with those issues - Per surgery, ex lap today   #Leg pain/swelling 2/2 bilateral DVTs: Most likely DVT due to hypercoagulable state from malignancy and/or recent inactivity after lying in bed for three weeks. Well's criteria for DVT 7 (high risk for DVT). Well's criteria for PE 5.5 (moderate risk). Physical exam also consistent with DVT. Some  improvement in pain/swelling today. Patient denies difficulty breathing.  - Consider CTA chest if difficulty breathing to rule out PE - Receiving heparin gtt for presumed DVT - Depending on clot burden patient may be a candidate for IVC filter  #Anemia: Hgb 9.7 on admission (12.3 one month ago). May be related to malignancy. FOBT negative. Patient currently denying bleeding or any symptoms of anemia. Vitals are stable. PT elevated at 32, APTT elevated at 81. INR improved to 1.3. Hgb this AM decreased to 8.5. - Monitor  - Transfusion threshold <7  #AKI: Unsure of baseline Cr but one month ago was 0.69. On admission 1.31. Patient did receive IV contrast in ED for CT scan. Also with decreased PO intake likely a prerenal etiology as well. BUN/Cr ratio >20. Cr improved to 0.91 (12/15).  - Avoid nephrotoxic agents including contrast - IV hydration  - I/Os  FEN/GI: NPO, Protonix IV, D5NS '@125ml'$ /hr Prophylaxis: heparin gtt discontinued overnight for surgery today  Subjective:  Patient reports continued abdominal pain this AM. He says Dilaudid helps, but has been waiting until his pain is more severe before requesting next dose. He endorses pain in his legs still as well.  Patient to have surgery today, and does not seem hopeful about a positive outcome. He is requesting to make changes to his will.   Objective: Temp:  [97.8 F (36.6 C)-98.3 F (36.8 C)] 98.3 F (36.8 C) (12/16 0343) Pulse Rate:  [79-86] 86 (12/16 0343) Resp:  [18-20] 20 (12/16 0343) BP: (106-114)/(59-73) 109/59 mmHg (12/16 0343) SpO2:  [95 %-98 %] 95 % (12/16 0343) Physical Exam: General: lying in bed in NAD, NG tube in place Cardiovascular: RRR, no murmurs appreciated Respiratory: CTAB, no wheezing, no  increased WOB Abdomen: slightly distended, significant hepatomegaly, diffusely TTP Extremities: 1+ pitting edema to knee on RLE with 2+ pitting edema of foot to ankle, palpable cords and positive Homan's sign, ecchymosis  mostly on thigh near groin; 1+ edema of L foot to ankle, trace edema but no TTP on LLE, negative Homan's, no cords palpated  Laboratory:  Recent Labs Lab 08/18/2015 0940 08/27/15 0540 08/27/2015 0526  WBC 13.6* 8.7 8.9  HGB 9.7* 8.4* 8.5*  HCT 29.4* 26.5* 26.9*  PLT 185 156 170    Recent Labs Lab 09/06/2015 0940 08/27/15 0540 08/27/15 1149  NA 132* 136  --   K 3.9 4.1  --   CL 94* 104  --   CO2 26 25  --   BUN 29* 16  --   CREATININE 1.31* 0.91  --   CALCIUM 8.8* 7.9*  --   PROT 7.4  --  5.3*  BILITOT 1.9*  --  1.6*  ALKPHOS 989*  --  702*  ALT 30  --  22  AST 49*  --  35  GLUCOSE 113* 114*  --     Imaging/Diagnostic Tests: Dg Chest 2 View  08/14/2015  CLINICAL DATA:  54 year old male with right lower quadrant abdominal pain and lower extremity swelling since last week. Nausea and vomiting for 2 weeks. Weakness. Initial encounter. EXAM: CHEST  2 VIEW COMPARISON:  CT Abdomen and Pelvis from today reported separately, and earlier. FINDINGS: Lung volumes are within normal limits. Normal cardiac size and mediastinal contours. No pneumothorax or pulmonary edema. No pleural effusion or consolidation. No pulmonary nodule identified. No acute osseous abnormality identified. Abnormal bowel gas in the abdomen, see comparison. IMPRESSION: No acute cardiopulmonary abnormality. See CT Abdomen and Pelvis from today reported separately. Electronically Signed   By: Genevie Ann M.D.   On: 09/11/2015 11:21   Dg Abd 1 View  08/29/2015  CLINICAL DATA:  Right abdominal pain and distention for 2 weeks. Constipation. EXAM: ABDOMEN - 1 VIEW COMPARISON:  07/14/2015 FINDINGS: There is marked distention of a loop of bowel, presumably colon. This finding is associated with a relative paucity of remaining bowel gas with a minimal amount of air seen within several loops of small bowel with index loop of small bowel in the left upper abdominal quadrant with index loop of presumably small bowel within the left upper  abdominal quadrant measuring approximately 4.3 cm in diameter. Nondiagnostic evaluation for pneumoperitoneum secondary to exclusion of the lower thorax. No definite pneumatosis or portal venous gas. No definite abnormal intra-abdominal calcifications. IMPRESSION: Findings worrisome for intestinal volvulus, potentially a cecal volvulus. Further evaluation with abdominal CT could be performed as indicated. Critical Value/emergent results were called by telephone at the time of interpretation on 08/29/2015 at 10:04 am to Dr. Davonna Belling , who verbally acknowledged these results. Electronically Signed   By: Sandi Mariscal M.D.   On: 09/03/2015 10:07   Ct Abdomen Pelvis W Contrast  08/28/2015  ADDENDUM REPORT: 09/11/2015 11:52 ADDENDUM: Study discussed by telephone with Dr. Davonna Belling on 08/18/2015 at 1132 hours. Electronically Signed   By: Genevie Ann M.D.   On: 09/07/2015 11:52  09/12/2015  CLINICAL DATA:  54 year old male with abdominal pain for 2 weeks and lower extremity swelling greater on the right. Unintentional weight loss for 8 weeks. Abnormal bowel gas pattern today. Initial encounter. EXAM: CT ABDOMEN AND PELVIS WITH CONTRAST TECHNIQUE: Multidetector CT imaging of the abdomen and pelvis was performed using the standard protocol following bolus administration  of intravenous contrast. CONTRAST:  177m OMNIPAQUE IOHEXOL 300 MG/ML  SOLN COMPARISON:  Radiographs from today and earlier. FINDINGS: There are a small number of subtle lung base pulmonary nodules (left lung series 3, images 1 and 2). There is a 12 mm more irregular pulmonary nodule in the right lateral costophrenic angle. No pleural effusion. No pericardial effusion. No destructive or suspicious osseous lesion identified. However, there is a a suspicious small right posterior iliac muscle mass measuring 11 mm on series 2, image 69. No abdominal free air. There is trace abdominal free fluid primarily in the right gutter. There is an obstructing  tumor of the colon at the hepatic flexure with indistinct margins suggesting mesenteric invasion and adjacent mesenteric nodal disease. The mass encompasses 55 x 66 x 46 mm (AP by transverse by CC). Surrounding irregular mesenteric nodal disease. Associated ipsilateral and contralateral retroperitoneal nodal disease, up to 17 mm short axis at the level of the left main renal vessels. Widespread hepatic metastatic disease. Hepatic metastases individually up to 5.5 cm diameter. This cecum is massively distended. The terminal ileum and distal small bowel are obstructed as expected and measure up to 4-1/2 cm diameter. Distal to the obstructing hepatic flexure tumor the colon is decompressed, with diverticulosis. Diminutive urinary bladder. Diminutive stomach. The tumor is inseparable from the second portion of the duodenum on coronal. The duodenum otherwise appears within normal limits. There is a small indeterminate 11 mm hypodense lesion in the spleen. The pancreas is within normal limits. 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 The kidneys are within normal limits. The portal venous system is patent. Major arterial structures in the abdomen and pelvis are patent. There is an indistinct appearance of both common femoral veins in the pelvis on series 2, image 85. Internal all decreased density at both. No inguinal lymphadenopathy. The IVC is patent. The common iliac veins are patent on the delayed images. No pelvic lymphadenopathy. IMPRESSION: 1. 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 Extensive hepatic metastases. Extensive nodal metastases. Left adrenal metastasis. 2. Severely obstructed cecum and distal small bowel. No free air. Trace free fluid. 3. Bilateral common femoral venous thrombosis suspected. Other major vascular structures appear patent. 4. Small indeterminate lung base nodules, indeterminate lesions in the spleen, and right  posterior pelvic musculature. Electronically Signed: By: HGenevie AnnM.D. On: 08/21/2015 11:30    AVerner Mould MD 08/14/2015, 9:00 AM PGY-1, [facility] pager: [phone] text pages welcome

## 2015-08-27 NOTE — Progress Notes (Signed)
ANTICOAGULATION CONSULT NOTE  Pharmacy Consult for heparin Indication: Bilateral common femoral venous thrombosis suspected  No Known Allergies  Patient Measurements: Height: 5\' 8"  (172.7 cm) Weight: 160 lb 8 oz (72.802 kg) IBW/kg (Calculated) : 68.4 Heparin Dosing Weight: 68kg  Vital Signs: Temp: 98.1 F (36.7 C) (12/15 0427) Temp Source: Oral (12/15 0427) BP: 106/64 mmHg (12/15 0427) Pulse Rate: 77 (12/15 0427)  Labs:  Recent Labs  09/10/2015 0940 08/21/2015 1515 08/20/2015 2015 08/27/15 0540 08/27/15 0846  HGB 9.7*  --   --  8.4*  --   HCT 29.4*  --   --  26.5*  --   PLT 185  --   --  156  --   APTT  --  81*  --   --   --   LABPROT  --  32.2*  --   --   --   INR  --  3.21*  --   --   --   HEPARINUNFRC  --   --  0.11*  --  0.17*  CREATININE 1.31*  --   --  0.91  --    Assessment: Pharmacy consulted to dose heparin for bilateral common femoral venous thrombosis per CT in setting of stage IV colon cancer with SBO and Extensive hepatic metastases (INR 3.21 on admission with no anti-coag PTA)  Pertinent levels: HL 0.11 on 1200 units/hr HL 0.17 on 1400 units/hr   Goal of Therapy:  Heparin level 0.3-0.7 units/ml Monitor platelets by anticoagulation protocol: Yes   Plan:  1. Increase heparin to 1600 units/hr; avoid bolus due to potential sx for SBO 2. HL in ~ 6 hours 3. Anemia; CBC lower this am with no sxs of bleeding will monitor closely 4. Monitor for plans of long term anti-coagulation  Vincenza Hews, PharmD, BCPS 08/27/2015, 10:47 AM Pager: [phone]

## 2015-08-27 NOTE — Progress Notes (Signed)
Initial Nutrition Assessment  DOCUMENTATION CODES:   Not applicable  INTERVENTION:   -RD will follow for diet advancement and supplement diet as appropriate  NUTRITION DIAGNOSIS:   Inadequate oral intake related to altered GI function as evidenced by NPO status.  GOAL:   Patient will meet greater than or equal to 90% of their needs  MONITOR:   Diet advancement, Labs, Weight trends, Skin, I & O's  REASON FOR ASSESSMENT:   Consult Assessment of nutrition requirement/status  ASSESSMENT:   Jesse Murray is a 54 y.o. male presenting with abdominal pain, as well as bilateral leg swelling and pain. PMH is significant for EtOH abuse.   Pt admitted with abdominal mass with SBO.  Reviewed surgical notes from 08/29/2015 and 08/27/15. Pt with stage IV cancer and DVT. Plans for possible ex-lap with resection and ostomy for palliation, dependent on INR (which is currently elevated).   Attempted to examine pt x 2, however, pt was receiving nursing care at times of both visits. NGT was recently placed for low, intermittent suction. He is currently NPO.  Per chart review, pt with weight loss and poor appetite over the past 3 weeks. Per wt hx, UBW around 180#. Pt has experienced a 21# (11.6%) wt loss over the past month. RD suspects some degree of malnutrition at this time, however, unable to confirm at this time.   Palliative care consult pending.   Labs reviewed.   Diet Order:  Diet NPO time specified  Skin:  Reviewed, no issues  Last BM:  08/24/15  Height:   Ht Readings from Last 1 Encounters:  09/06/2015 5\' 8"  (1.727 m)    Weight:   Wt Readings from Last 1 Encounters:  08/22/2015 160 lb 8 oz (72.802 kg)    Ideal Body Weight:  70 kg  BMI:  Body mass index is 24.41 kg/(m^2).  Estimated Nutritional Needs:   Kcal:  2000-2200  Protein:  95-110 grams  Fluid:  2.0-2.2 L  EDUCATION NEEDS:   No education needs identified at this time  Iyanah Demont A. Jimmye Norman, RD, LDN,  CDE Pager: [phone] After hours Pager: [phone]

## 2015-08-27 NOTE — Progress Notes (Signed)
Subjective: He still seems to be in shock over all of this.  I am not sure he understands all that is occuring.  He has not had any nausea since he came in.  NG has not been placed.  I will recheck his INR stat, and get a type with it .  Recheck LFT's also.  Exam is about the same, he is having some discomfort mid abdomen.    Objective: Vital signs in last 24 hours: Temp:  [98.1 F (36.7 C)-98.6 F (37 C)] 98.1 F (36.7 C) (12/15 0427) Pulse Rate:  [76-98] 77 (12/15 0427) Resp:  [18-20] 18 (12/15 0427) BP: (95-122)/(58-81) 106/64 mmHg (12/15 0427) SpO2:  [96 %-100 %] 100 % (12/15 0427) Weight:  [72.802 kg (160 lb 8 oz)] 72.802 kg (160 lb 8 oz) (12/14 1358) Last BM Date: 08/24/15 NPO. Urine not recorded VSS, afebrile  H/H is down with hydration and he is severely malnourished INR is 3.21 yesterday CA 19-9 29262 CEA 107  Intake/Output from previous day: 12/14 0701 - 12/15 0700 In: 1500 [I.V.:1500] Out: -  Intake/Output this shift:    General appearance: alert, cooperative and no distress Resp: clear to auscultation bilaterally GI: soft, still having some pain, but over all feels about the same, no nausea or vomiting.  Lab Results:   Recent Labs  09/12/2015 0940 08/27/15 0540  WBC 13.6* 8.7  HGB 9.7* 8.4*  HCT 29.4* 26.5*  PLT 185 156    BMET  Recent Labs  09/09/2015 0940 08/27/15 0540  NA 132* 136  K 3.9 4.1  CL 94* 104  CO2 26 25  GLUCOSE 113* 114*  BUN 29* 16  CREATININE 1.31* 0.91  CALCIUM 8.8* 7.9*   PT/INR  Recent Labs  08/27/2015 1515  LABPROT 32.2*  INR 3.21*     Recent Labs Lab 09/05/2015 0940  AST 49*  ALT 30  ALKPHOS 989*  BILITOT 1.9*  PROT 7.4  ALBUMIN 3.0*     Lipase     Component Value Date/Time   LIPASE 31 08/28/2015 0940     Studies/Results: Dg Chest 2 View  09/08/2015  CLINICAL DATA:  54 year old male with right lower quadrant abdominal pain and lower extremity swelling since last week. Nausea and vomiting for 2  weeks. Weakness. Initial encounter. EXAM: CHEST  2 VIEW COMPARISON:  CT Abdomen and Pelvis from today reported separately, and earlier. FINDINGS: Lung volumes are within normal limits. Normal cardiac size and mediastinal contours. No pneumothorax or pulmonary edema. No pleural effusion or consolidation. No pulmonary nodule identified. No acute osseous abnormality identified. Abnormal bowel gas in the abdomen, see comparison. IMPRESSION: No acute cardiopulmonary abnormality. See CT Abdomen and Pelvis from today reported separately. Electronically Signed   By: Genevie Ann M.D.   On: 08/13/2015 11:21   Dg Abd 1 View  09/04/2015  CLINICAL DATA:  Right abdominal pain and distention for 2 weeks. Constipation. EXAM: ABDOMEN - 1 VIEW COMPARISON:  07/14/2015 FINDINGS: There is marked distention of a loop of bowel, presumably colon. This finding is associated with a relative paucity of remaining bowel gas with a minimal amount of air seen within several loops of small bowel with index loop of small bowel in the left upper abdominal quadrant with index loop of presumably small bowel within the left upper abdominal quadrant measuring approximately 4.3 cm in diameter. Nondiagnostic evaluation for pneumoperitoneum secondary to exclusion of the lower thorax. No definite pneumatosis or portal venous gas. No definite abnormal intra-abdominal calcifications. IMPRESSION: Findings  worrisome for intestinal volvulus, potentially a cecal volvulus. Further evaluation with abdominal CT could be performed as indicated. Critical Value/emergent results were called by telephone at the time of interpretation on 08/29/2015 at 10:04 am to Dr. Davonna Belling , who verbally acknowledged these results. Electronically Signed   By: Sandi Mariscal M.D.   On: 09/05/2015 10:07   Ct Abdomen Pelvis W Contrast  08/21/2015  ADDENDUM REPORT: 09/02/2015 11:52 ADDENDUM: Study discussed by telephone with Dr. Davonna Belling on 09/03/2015 at 1132 hours.  Electronically Signed   By: Genevie Ann M.D.   On: 09/05/2015 11:52  09/03/2015  CLINICAL DATA:  54 year old male with abdominal pain for 2 weeks and lower extremity swelling greater on the right. Unintentional weight loss for 8 weeks. Abnormal bowel gas pattern today. Initial encounter. EXAM: CT ABDOMEN AND PELVIS WITH CONTRAST TECHNIQUE: Multidetector CT imaging of the abdomen and pelvis was performed using the standard protocol following bolus administration of intravenous contrast. CONTRAST:  122mL OMNIPAQUE IOHEXOL 300 MG/ML  SOLN COMPARISON:  Radiographs from today and earlier. FINDINGS: There are a small number of subtle lung base pulmonary nodules (left lung series 3, images 1 and 2). There is a 12 mm more irregular pulmonary nodule in the right lateral costophrenic angle. No pleural effusion. No pericardial effusion. No destructive or suspicious osseous lesion identified. However, there is a a suspicious small right posterior iliac muscle mass measuring 11 mm on series 2, image 69. No abdominal free air. There is trace abdominal free fluid primarily in the right gutter. There is an obstructing tumor of the colon at the hepatic flexure with indistinct margins suggesting mesenteric invasion and adjacent mesenteric nodal disease. The mass encompasses 55 x 66 x 46 mm (AP by transverse by CC). Surrounding irregular mesenteric nodal disease. Associated ipsilateral and contralateral retroperitoneal nodal disease, up to 17 mm short axis at the level of the left main renal vessels. Widespread hepatic metastatic disease. Hepatic metastases individually up to 5.5 cm diameter. This cecum is massively distended. The terminal ileum and distal small bowel are obstructed as expected and measure up to 4-1/2 cm diameter. Distal to the obstructing hepatic flexure tumor the colon is decompressed, with diverticulosis. Diminutive urinary bladder. Diminutive stomach. The tumor is inseparable from the second portion of the duodenum  on coronal. The duodenum otherwise appears within normal limits. There is a small indeterminate 11 mm hypodense lesion in the spleen. The pancreas is within normal limits. 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 The kidneys are within normal limits. The portal venous system is patent. Major arterial structures in the abdomen and pelvis are patent. There is an indistinct appearance of both common femoral veins in the pelvis on series 2, image 85. Internal all decreased density at both. No inguinal lymphadenopathy. The IVC is patent. The common iliac veins are patent on the delayed images. No pelvic lymphadenopathy. IMPRESSION: 1. 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 Extensive hepatic metastases. Extensive nodal metastases. Left adrenal metastasis. 2. Severely obstructed cecum and distal small bowel. No free air. Trace free fluid. 3. Bilateral common femoral venous thrombosis suspected. Other major vascular structures appear patent. 4. Small indeterminate lung base nodules, indeterminate lesions in the spleen, and right posterior pelvic musculature. Electronically Signed: By: Genevie Ann M.D. On: 08/16/2015 11:30    Medications: . pantoprazole (PROTONIX) IV  40 mg Intravenous QHS   . dextrose 5 % and 0.9% NaCl  1,000 mL (08/27/15 0706)  . heparin 1,400 Units/hr (08/27/15 0705)      Assessment/Plan Right colon mass with obstruction and massive cecal dilatation Liver, left adrenal, and mesenteric nodal metastasis by CT Bilateral common femoral venous thrombosis by CT Small intermediate lung nodules Hyponatremia Acute renal insuffiencey  Elevated LFT's related to liver metastasis Weight loss 30-40 lbs over 1.5 months Malnourished/deconditioning.   Plan:  Dr. Ninfa Linden is planning to see him later and talk about surgery.  I am rechecking his INR and LFT's now along with a type and screen.  i will also get a baseline EKG.  He  is being seen by Palliative.    As noted above CA 19-9  29262,  CEA 107.   LOS: 1 day    Roselina Burgueno 08/27/2015

## 2015-08-27 NOTE — Consult Note (Signed)
Consultation Note Date: 08/27/2015   Patient Name: Jesse Murray  DOB: 12/02/1960  MRN: [id number]  Age / Sex: 54 y.o., male  PCP: No Pcp Per Patient Referring Physician: Kinnie Feil, MD  Reason for Consultation: Establishing goals of care and Psychosocial/spiritual support    Clinical Assessment/Narrative:  54 Y/O M with no significant past medical hx presented to the hospital with 2 months hx of abdominal pain, mostly on the RUQ associated with nausea and vomiting. He went to see his primary who treated it initially viral infection and on his second return to the urgent care, imaging study was recommended but he did not get it, 2/2 to financial hardship.  He endorses weight loss of 30 lbs in 1 month. He also c/o right LE swelling which started 3 wks ago after her dropped a heavy box on his foot. Swelling has improved some, there is associated pain.   Admitted for work-up.  Stage IV colon cancer with bowel obstruction:   CT abd/pelvis shows sequelae of obstructing Stage IV colon cancer with extensive hepatic, nodal, and adrenal metastases, as well as severely obstructed cecum and distal small bowel. Also suspicious for bilateral common femoral venous thromboses.  This NP Wadie Lessen reviewed medical records, received report from team, assessed the patient and then meet at the patient's bedside to introduce the role of Palliative Medicine into a holistic care plan and  to  create space and opportunity for patient to verbalize thoughts, fears and feelings related to his new diagnosis of advanced cancer. One big concern is his limited support system  (family lives in [place], [place] support from [employer]) and financial impact.  Connected him with Development worker, community.   A discussion was had today regarding advanced directives.  Concepts specific to code status was had.  The difference between a aggressive medical  intervention path  and a palliative comfort care path for this patient at this time was had.  Values and goals of care important to patient were attempted to be elicited.   Patient is facing treatment option decisions, he awaits surgery which he is hopeful "will give me some relief".     Questions and concerns addressed.  PMT will continue to support holistically.   Primary Decision Maker: self    HCPOA: no -encouraged to consider establishing HPOA while in hospital;   [place]  - precede with surgery in the morning in attempt to  Relieve bowel obstuction -oncology consultation in near future -continue to work with PMT in navigating health care [place]: Full code -encouraged to contemplate consideration of DNR/DNI as initial decision step regarding AD    Code Status Orders        Start     Ordered   08/25/2015 1401  Full code   Continuous     08/21/2015 1404      Other Directives:None  Symptom Management:    Pain: Dilaudid 0.5 mg IV every 1 hr prn  Anxiety: Ativan 1 mg IV every 6 hrs prn  Palliative Prophylaxis:    Delirium Protocol, Frequent Pain Assessment and Oral Care   Psycho-social/Spiritual:  Support System: Poor Desire for further Chaplaincy support:no Additional Recommendations: Medicaid/Financial Assistance  Prognosis: Unable to determine-Pending outcomes  Discharge Planning:  Pending outcomes   Chief Complaint/ Primary Diagnoses: Present on Admission:  . Colon cancer ([place]) . Abdominal pain  I have reviewed the medical record, interviewed the patient and family, and examined the patient. The  following aspects are pertinent.  Past Medical History  Diagnosis Date  . DVT (deep venous thrombosis) ([place]) dx'd ~ 08/22/2015    RLE  . Small bowel obstruction ([place]) 08/24/2015  . Abdominal mass 08/20/2015  . Headache     "weekly" (08/14/2015)  . Colon cancer ([place]) dx'd 09/04/2015    stage  IV/notes 08/24/2015   Social History   Social History  . Marital Status: Divorced    Spouse Name: N/A  . Number of Children: N/A  . Years of Education: N/A   Social History Main Topics  . Smoking status: Never Smoker   . Smokeless tobacco: Never Used  . Alcohol Use: 0.0 oz/week    0 Standard drinks or equivalent per week     Comment: history of heavy alcohol abuse from 2003-2010; quit drinking in 2010  . Drug Use: No  . Sexual Activity: No   Other Topics Concern  . None   Social History Narrative   Family History  Problem Relation Age of Onset  . Deep vein thrombosis Father 83  . Melanoma Father   . Lung cancer Paternal Grandfather   . Cirrhosis Paternal Grandfather   . Heart attack    . Diabetes     Scheduled Meds: . pantoprazole (PROTONIX) IV  40 mg Intravenous QHS   Continuous Infusions: . dextrose 5 % and 0.9% NaCl 1,000 mL (08/27/15 0706)  . heparin 1,600 Units/hr (08/27/15 1103)   PRN Meds:.HYDROmorphone (DILAUDID) injection, LORazepam, ondansetron **OR** ondansetron (ZOFRAN) IV Medications Prior to Admission:  Prior to Admission medications   Not on File   No Known Allergies  Review of Systems  Constitutional: Positive for activity change, appetite change, fatigue and unexpected weight change.  Cardiovascular: Positive for leg swelling.  Musculoskeletal: Positive for back pain.    Physical Exam  Constitutional: He appears well-developed. He is cooperative. He has a sickly appearance.  HENT:  Head: Normocephalic and atraumatic.  Mouth/Throat: Mucous membranes are dry. No oropharyngeal exudate.  + temporal muscle wasting  Cardiovascular: Normal rate, regular rhythm and normal heart sounds.   GI: He exhibits distension. There is generalized tenderness.  Musculoskeletal:  generalized weakness  Neurological: He is alert.  Skin: Skin is warm, dry and intact.    Vital Signs: BP 106/64 mmHg  Pulse 77  Temp(Src) 98.1 F (36.7 C) (Oral)  Resp 18   Ht 5\' 8"  (1.727 m)  Wt 72.802 kg (160 lb 8 oz)  BMI 24.41 kg/m2  SpO2 100%  SpO2: SpO2: 100 % O2 Device:SpO2: 100 % O2 Flow Rate: .   IO: Intake/output summary:  Intake/Output Summary (Last 24 hours) at 08/27/15 1118 Last data filed at 08/27/15 0703  Gross per 24 hour  Intake   1500 ml  Output      0 ml  Net   1500 ml    LBM: Last BM Date: 08/24/15 Baseline Weight: Weight: 68.04 kg (150 lb) Most recent weight: Weight: 72.802 kg (160 lb 8 oz)      Palliative Assessment/Data:  Flowsheet Rows        Most Recent Value   Intake Tab    Referral Department  -- [[facility]   Unit at Time of Referral  Med/Surg Unit   Palliative Care Primary Diagnosis  Cancer   Date Notified  08/25/2015   Palliative Care Type  New Palliative care   Reason for referral  Clarify Goals of Care   Date of Admission  08/20/2015   Date first seen by Palliative  Care  08/13/2015   # of days Palliative referral response time  0 Day(s)   # of days IP prior to Palliative referral  0   Clinical Assessment    Psychosocial & Spiritual Assessment    Palliative Care Outcomes       Additional Data Reviewed:  CBC:    Component Value Date/Time   WBC 8.7 08/27/2015 0540   WBC 11.1* 07/14/2015 1745   HGB 8.4* 08/27/2015 0540   HGB 12.3* 07/14/2015 1745   HCT 26.5* 08/27/2015 0540   HCT 36.5* 07/14/2015 1745   PLT 156 08/27/2015 0540   MCV 87.7 08/27/2015 0540   MCV 86.3 07/14/2015 1745   NEUTROABS 10.1* 08/25/2015 0940   LYMPHSABS 2.1 08/21/2015 0940   MONOABS 0.9 08/18/2015 0940   EOSABS 0.4 08/19/2015 0940   BASOSABS 0.1 08/30/2015 0940   Comprehensive Metabolic Panel:    Component Value Date/Time   NA 136 08/27/2015 0540   K 4.1 08/27/2015 0540   CL 104 08/27/2015 0540   CO2 25 08/27/2015 0540   BUN 16 08/27/2015 0540   CREATININE 0.91 08/27/2015 0540   CREATININE 0.69* 07/14/2015 1732   GLUCOSE 114* 08/27/2015 0540   CALCIUM 7.9* 08/27/2015 0540   AST 49* 09/01/2015 0940   ALT 30  08/22/2015 0940   ALKPHOS 989* 09/08/2015 0940   BILITOT 1.9* 09/05/2015 0940   PROT 7.4 09/09/2015 0940   ALBUMIN 3.0* 09/03/2015 0940     Time In: 1000 Time Out: 1130 Time Total: 90 min Greater than 50%  of this time was spent counseling and coordinating care related to the above assessment and plan.  Signed by: Wadie Lessen, NP  Knox Royalty, NP  08/27/2015, 11:18 AM  Please contact Palliative Medicine Team phone at [phone] for questions and concerns.

## 2015-08-27 NOTE — Progress Notes (Signed)
ANTICOAGULATION CONSULT NOTE - Follow Up Consult  Pharmacy Consult for heparin Indication: bilateral common femoral venous thrombosis per CT  No Known Allergies  Patient Measurements: Height: 5\' 8"  (172.7 cm) Weight: 160 lb 8 oz (72.802 kg) IBW/kg (Calculated) : 68.4 Heparin Dosing Weight: 68 kg  Vital Signs: Temp: 97.8 F (36.6 C) (12/15 1249) Temp Source: Oral (12/15 1249) BP: 106/64 mmHg (12/15 1249) Pulse Rate: 79 (12/15 1249)  Labs:  Recent Labs  09/03/2015 0940 09/11/2015 1515 09/05/2015 2015 08/27/15 0540 08/27/15 0846 08/27/15 1149 08/27/15 1814  HGB 9.7*  --   --  8.4*  --   --   --   HCT 29.4*  --   --  26.5*  --   --   --   PLT 185  --   --  156  --   --   --   APTT  --  81*  --   --   --   --   --   LABPROT  --  32.2*  --   --   --  27.8*  --   INR  --  3.21*  --   --   --  2.64*  --   HEPARINUNFRC  --   --  0.11*  --  0.17*  --  0.30  CREATININE 1.31*  --   --  0.91  --   --   --     Estimated Creatinine Clearance: 90.8 mL/min (by C-G formula based on Cr of 0.91).  Assessment: Anticoagulation: colon cancer w/ swollen leg and bilateral common femoral venous thrombosis per CT  Heparin level now 0.3 in goal. Holding heparin starting at 2 AM on Friday for potential surgery per MD order. Surgery only if INR<2  Goal of Therapy:  Heparin level 0.3-0.7 units/ml Monitor platelets by anticoagulation protocol: Yes   Plan:   Increase  IV heparin to 1700 units/hr--Hold starting 0200 on 12/16 Vitamin K 1mg  IV x 1 today. Surgery 12/16 only if [employer] S. Alford Highland, PharmD, BCPS Clinical Staff Pharmacist Pager [phone]  Eilene Ghazi Stillinger 08/27/2015,7:23 PM

## 2015-08-28 ENCOUNTER — Inpatient Hospital Stay (HOSPITAL_COMMUNITY): Payer: Medicaid Other | Admitting: Certified Registered"

## 2015-08-28 ENCOUNTER — Encounter (HOSPITAL_COMMUNITY): Admission: EM | Disposition: E | Payer: Self-pay | Source: Home / Self Care | Attending: Family Medicine

## 2015-08-28 ENCOUNTER — Encounter (HOSPITAL_COMMUNITY): Payer: Self-pay | Admitting: Anesthesiology

## 2015-08-28 DIAGNOSIS — I824Y9 Acute embolism and thrombosis of unspecified deep veins of unspecified proximal lower extremity: Secondary | ICD-10-CM | POA: Insufficient documentation

## 2015-08-28 HISTORY — PX: LAPAROTOMY: SHX154

## 2015-08-28 HISTORY — PX: ILEOSTOMY: SHX1783

## 2015-08-28 HISTORY — PX: PARTIAL COLECTOMY: SHX5273

## 2015-08-28 HISTORY — PX: OSTOMY: SHX5997

## 2015-08-28 LAB — CBC
HEMATOCRIT: 26.9 % — AB (ref 39.0–52.0)
HEMOGLOBIN: 8.5 g/dL — AB (ref 13.0–17.0)
MCH: 28.1 pg (ref 26.0–34.0)
MCHC: 31.6 g/dL (ref 30.0–36.0)
MCV: 89.1 fL (ref 78.0–100.0)
Platelets: 170 10*3/uL (ref 150–400)
RBC: 3.02 MIL/uL — ABNORMAL LOW (ref 4.22–5.81)
RDW: 16.2 % — ABNORMAL HIGH (ref 11.5–15.5)
WBC: 8.9 10*3/uL (ref 4.0–10.5)

## 2015-08-28 LAB — PROTIME-INR
INR: 1.38 (ref 0.00–1.49)
Prothrombin Time: 17.1 seconds — ABNORMAL HIGH (ref 11.6–15.2)

## 2015-08-28 SURGERY — LAPAROTOMY, EXPLORATORY
Anesthesia: General | Site: Abdomen

## 2015-08-28 MED ORDER — SODIUM CHLORIDE 0.9 % IJ SOLN
9.0000 mL | INTRAMUSCULAR | Status: DC | PRN
Start: 2015-08-28 — End: 2015-09-02

## 2015-08-28 MED ORDER — PROPOFOL 10 MG/ML IV BOLUS
INTRAVENOUS | Status: AC
  Filled 2015-08-28: qty 20

## 2015-08-28 MED ORDER — SUCCINYLCHOLINE CHLORIDE 20 MG/ML IJ SOLN
INTRAMUSCULAR | Status: DC | PRN
  Administered 2015-08-28: 80 mg via INTRAVENOUS

## 2015-08-28 MED ORDER — ONDANSETRON HCL 4 MG/2ML IJ SOLN: 4.0000 mg | Freq: Four times a day (QID) | INTRAMUSCULAR | Status: DC | PRN

## 2015-08-28 MED ORDER — FENTANYL CITRATE (PF) 100 MCG/2ML IJ SOLN
INTRAMUSCULAR | Status: AC
  Administered 2015-08-28: 50 ug via INTRAVENOUS
  Filled 2015-08-28: qty 2

## 2015-08-28 MED ORDER — HYDROMORPHONE HCL 1 MG/ML IJ SOLN
INTRAMUSCULAR | Status: DC | PRN
  Administered 2015-08-28: .25 mg via INTRAVENOUS

## 2015-08-28 MED ORDER — HEPARIN (PORCINE) IN NACL 100-0.45 UNIT/ML-% IJ SOLN
1700.0000 [IU]/h | INTRAMUSCULAR | Status: DC
  Administered 2015-08-28 – 2015-08-29 (×2): 1700 [IU]/h via INTRAVENOUS
  Filled 2015-08-28 (×2): qty 250

## 2015-08-28 MED ORDER — FENTANYL CITRATE (PF) 100 MCG/2ML IJ SOLN
INTRAMUSCULAR | Status: DC | PRN
  Administered 2015-08-28: 100 ug via INTRAVENOUS
  Administered 2015-08-28 (×2): 50 ug via INTRAVENOUS

## 2015-08-28 MED ORDER — ONDANSETRON HCL 4 MG/2ML IJ SOLN
INTRAMUSCULAR | Status: DC | PRN
  Administered 2015-08-28: 4 mg via INTRAVENOUS

## 2015-08-28 MED ORDER — MIDAZOLAM HCL 2 MG/2ML IJ SOLN
INTRAMUSCULAR | Status: AC
  Filled 2015-08-28: qty 2

## 2015-08-28 MED ORDER — SUGAMMADEX SODIUM 200 MG/2ML IV SOLN
INTRAVENOUS | Status: DC | PRN
  Administered 2015-08-28: 200 mg via INTRAVENOUS

## 2015-08-28 MED ORDER — SUGAMMADEX SODIUM 200 MG/2ML IV SOLN
INTRAVENOUS | Status: AC
  Filled 2015-08-28: qty 2

## 2015-08-28 MED ORDER — ROCURONIUM BROMIDE 100 MG/10ML IV SOLN
INTRAVENOUS | Status: DC | PRN
  Administered 2015-08-28: 10 mg via INTRAVENOUS
  Administered 2015-08-28: 40 mg via INTRAVENOUS

## 2015-08-28 MED ORDER — DIPHENHYDRAMINE HCL 50 MG/ML IJ SOLN: 12.5000 mg | Freq: Four times a day (QID) | INTRAMUSCULAR | Status: DC | PRN

## 2015-08-28 MED ORDER — OXYCODONE HCL 5 MG PO TABS: 5.0000 mg | ORAL_TABLET | Freq: Once | ORAL | Status: DC | PRN

## 2015-08-28 MED ORDER — NALOXONE HCL 0.4 MG/ML IJ SOLN: 0.4000 mg | INTRAMUSCULAR | Status: DC | PRN

## 2015-08-28 MED ORDER — HYDROMORPHONE HCL 1 MG/ML IJ SOLN
INTRAMUSCULAR | Status: AC
  Filled 2015-08-28: qty 1

## 2015-08-28 MED ORDER — ACETAMINOPHEN 325 MG PO TABS: 325.0000 mg | ORAL_TABLET | ORAL | Status: DC | PRN

## 2015-08-28 MED ORDER — DIPHENHYDRAMINE HCL 12.5 MG/5ML PO ELIX
12.5000 mg | ORAL_SOLUTION | Freq: Four times a day (QID) | ORAL | Status: DC | PRN
  Filled 2015-08-28: qty 5

## 2015-08-28 MED ORDER — 0.9 % SODIUM CHLORIDE (POUR BTL) OPTIME
TOPICAL | Status: DC | PRN
  Administered 2015-08-28: 1000 mL

## 2015-08-28 MED ORDER — OXYCODONE HCL 5 MG/5ML PO SOLN: 5.0000 mg | Freq: Once | ORAL | Status: DC | PRN

## 2015-08-28 MED ORDER — CEFAZOLIN SODIUM-DEXTROSE 2-3 GM-% IV SOLR
2.0000 g | Freq: Once | INTRAVENOUS | Status: AC
  Administered 2015-08-28: 2 g via INTRAVENOUS
  Filled 2015-08-28: qty 50

## 2015-08-28 MED ORDER — METRONIDAZOLE IN NACL 5-0.79 MG/ML-% IV SOLN
500.0000 mg | Freq: Once | INTRAVENOUS | Status: AC
  Administered 2015-08-28: 500 mg via INTRAVENOUS
  Filled 2015-08-28: qty 100

## 2015-08-28 MED ORDER — PROPOFOL 10 MG/ML IV BOLUS
INTRAVENOUS | Status: DC | PRN
  Administered 2015-08-28: 100 mg via INTRAVENOUS

## 2015-08-28 MED ORDER — FENTANYL CITRATE (PF) 100 MCG/2ML IJ SOLN
25.0000 ug | INTRAMUSCULAR | Status: DC | PRN
  Administered 2015-08-28 (×2): 50 ug via INTRAVENOUS

## 2015-08-28 MED ORDER — METHOCARBAMOL 1000 MG/10ML IJ SOLN
500.0000 mg | Freq: Three times a day (TID) | INTRAVENOUS | Status: DC | PRN
  Administered 2015-08-29: 500 mg via INTRAVENOUS
  Filled 2015-08-28 (×4): qty 5

## 2015-08-28 MED ORDER — HYDROMORPHONE 1 MG/ML IV SOLN
INTRAVENOUS | Status: AC
  Filled 2015-08-28: qty 25

## 2015-08-28 MED ORDER — ACETAMINOPHEN 160 MG/5ML PO SOLN
325.0000 mg | ORAL | Status: DC | PRN
  Filled 2015-08-28: qty 20.3

## 2015-08-28 MED ORDER — HEMOSTATIC AGENTS (NO CHARGE) OPTIME
TOPICAL | Status: DC | PRN
  Administered 2015-08-28: 1 via TOPICAL

## 2015-08-28 MED ORDER — HYDROMORPHONE 1 MG/ML IV SOLN
INTRAVENOUS | Status: DC
  Administered 2015-08-28: 14:00:00 via INTRAVENOUS
  Administered 2015-08-28: 4.2 mg via INTRAVENOUS
  Administered 2015-08-29: 3 mg via INTRAVENOUS
  Administered 2015-08-29: 17:00:00 via INTRAVENOUS
  Administered 2015-08-29: 3.9 mg via INTRAVENOUS
  Administered 2015-08-29: 6.6 mg via INTRAVENOUS
  Administered 2015-08-29: 4 mL via INTRAVENOUS
  Administered 2015-08-29: 2.4 mg via INTRAVENOUS
  Administered 2015-08-30: 2.7 mg via INTRAVENOUS
  Administered 2015-08-30: 3 mg via INTRAVENOUS
  Administered 2015-08-30: 4.7 mg via INTRAVENOUS
  Administered 2015-08-30: 2.9 mg via INTRAVENOUS
  Administered 2015-08-30: 0.9 mg via INTRAVENOUS
  Administered 2015-08-30 – 2015-08-31 (×3): 2.1 mg via INTRAVENOUS
  Administered 2015-08-31: 1.2 mg via INTRAVENOUS
  Administered 2015-08-31: 1.8 mg via INTRAVENOUS
  Administered 2015-08-31 (×2): 2.4 mg via INTRAVENOUS
  Administered 2015-08-31: 1.5 mg via INTRAVENOUS
  Administered 2015-09-01: 3 mg via INTRAVENOUS
  Administered 2015-09-01: 2.7 mg via INTRAVENOUS
  Administered 2015-09-01: 3 mg via INTRAVENOUS
  Administered 2015-09-01: 2.4 mg via INTRAVENOUS
  Administered 2015-09-01 (×2): 1.8 mg via INTRAVENOUS
  Administered 2015-09-01: 3.6 mg via INTRAVENOUS
  Administered 2015-09-02: 2.6 mg via INTRAVENOUS
  Administered 2015-09-02: 3 mg via INTRAVENOUS
  Filled 2015-08-28 (×3): qty 25

## 2015-08-28 MED ORDER — FENTANYL CITRATE (PF) 250 MCG/5ML IJ SOLN
INTRAMUSCULAR | Status: AC
  Filled 2015-08-28: qty 5

## 2015-08-28 MED ORDER — LACTATED RINGERS IV SOLN
INTRAVENOUS | Status: DC
  Administered 2015-08-28 (×3): via INTRAVENOUS

## 2015-08-28 SURGICAL SUPPLY — 56 items
BLADE SURG ROTATE 9660 (MISCELLANEOUS) ×4 IMPLANT
CANISTER SUCTION 2500CC (MISCELLANEOUS) ×4 IMPLANT
CHLORAPREP W/TINT 26ML (MISCELLANEOUS) ×4 IMPLANT
COVER MAYO STAND STRL (DRAPES) IMPLANT
COVER SURGICAL LIGHT HANDLE (MISCELLANEOUS) ×4 IMPLANT
DRAPE LAPAROSCOPIC ABDOMINAL (DRAPES) ×4 IMPLANT
DRAPE PROXIMA HALF (DRAPES) IMPLANT
DRAPE UTILITY XL STRL (DRAPES) ×8 IMPLANT
DRAPE WARM FLUID 44X44 (DRAPE) ×4 IMPLANT
DRSG MEPILEX BORDER 4X12 (GAUZE/BANDAGES/DRESSINGS) IMPLANT
DRSG MEPILEX BORDER 4X8 (GAUZE/BANDAGES/DRESSINGS) IMPLANT
DRSG OPSITE POSTOP 4X10 (GAUZE/BANDAGES/DRESSINGS) IMPLANT
DRSG OPSITE POSTOP 4X8 (GAUZE/BANDAGES/DRESSINGS) IMPLANT
ELECT BLADE 6.5 EXT (BLADE) IMPLANT
ELECT CAUTERY BLADE 6.4 (BLADE) ×8 IMPLANT
ELECT REM PT RETURN 9FT ADLT (ELECTROSURGICAL) ×4
ELECTRODE REM PT RTRN 9FT ADLT (ELECTROSURGICAL) ×2 IMPLANT
GAUZE SPONGE 4X4 12PLY STRL (GAUZE/BANDAGES/DRESSINGS) ×4 IMPLANT
GLOVE BIO SURGEON STRL SZ7 (GLOVE) ×4 IMPLANT
GLOVE BIOGEL PI IND STRL 8 (GLOVE) ×2 IMPLANT
GLOVE BIOGEL PI INDICATOR 8 (GLOVE) ×2
GLOVE ECLIPSE 7.0 STRL STRAW (GLOVE) ×4 IMPLANT
GLOVE SURG SIGNA 7.5 PF LTX (GLOVE) ×12 IMPLANT
GOWN STRL REUS W/ TWL LRG LVL3 (GOWN DISPOSABLE) ×4 IMPLANT
GOWN STRL REUS W/ TWL XL LVL3 (GOWN DISPOSABLE) ×2 IMPLANT
GOWN STRL REUS W/TWL LRG LVL3 (GOWN DISPOSABLE) ×4
GOWN STRL REUS W/TWL XL LVL3 (GOWN DISPOSABLE) ×2
KIT BASIN OR (CUSTOM PROCEDURE TRAY) ×4 IMPLANT
KIT OSTOMY DRAINABLE 2.75 STR (WOUND CARE) ×4 IMPLANT
KIT ROOM TURNOVER OR (KITS) ×4 IMPLANT
LIGASURE IMPACT 36 18CM CVD LR (INSTRUMENTS) ×8 IMPLANT
NS IRRIG 1000ML POUR BTL (IV SOLUTION) ×8 IMPLANT
PACK GENERAL/GYN (CUSTOM PROCEDURE TRAY) ×4 IMPLANT
PAD ARMBOARD 7.5X6 YLW CONV (MISCELLANEOUS) ×4 IMPLANT
PENCIL BUTTON HOLSTER BLD 10FT (ELECTRODE) IMPLANT
RELOAD PROXIMATE 75MM BLUE (ENDOMECHANICALS) ×12 IMPLANT
SPECIMEN JAR LARGE (MISCELLANEOUS) ×4 IMPLANT
SPONGE GAUZE 4X4 12PLY STER LF (GAUZE/BANDAGES/DRESSINGS) ×4 IMPLANT
SPONGE LAP 18X18 X RAY DECT (DISPOSABLE) ×4 IMPLANT
STAPLER PROXIMATE 75MM BLUE (STAPLE) ×4 IMPLANT
STAPLER VISISTAT 35W (STAPLE) ×4 IMPLANT
SUCTION POOLE TIP (SUCTIONS) ×4 IMPLANT
SUT PDS AB 1 TP1 96 (SUTURE) ×8 IMPLANT
SUT SILK 2 0 SH CR/8 (SUTURE) ×4 IMPLANT
SUT SILK 2 0 TIES 10X30 (SUTURE) ×4 IMPLANT
SUT SILK 3 0 SH CR/8 (SUTURE) ×4 IMPLANT
SUT SILK 3 0 TIES 10X30 (SUTURE) ×8 IMPLANT
SUT VIC AB 3-0 SH 18 (SUTURE) ×8 IMPLANT
TOWEL OR 17X24 6PK STRL BLUE (TOWEL DISPOSABLE) ×4 IMPLANT
TOWEL OR 17X26 10 PK STRL BLUE (TOWEL DISPOSABLE) ×4 IMPLANT
TRAY FOLEY CATH 14FRSI W/METER (CATHETERS) IMPLANT
TRAY FOLEY CATH 16FRSI W/METER (SET/KITS/TRAYS/PACK) ×4 IMPLANT
TUBE CONNECTING 12'X1/4 (SUCTIONS)
TUBE CONNECTING 12X1/4 (SUCTIONS) IMPLANT
WATER STERILE IRR 1000ML POUR (IV SOLUTION) IMPLANT
YANKAUER SUCT BULB TIP NO VENT (SUCTIONS) ×8 IMPLANT

## 2015-08-28 NOTE — Progress Notes (Signed)
Subjective: comfortable  Objective: Vital signs in last 24 hours: Temp:  [97.8 F (36.6 C)-98.3 F (36.8 C)] 98.3 F (36.8 C) (12/16 0343) Pulse Rate:  [79-86] 86 (12/16 0343) Resp:  [18-20] 20 (12/16 0343) BP: (106-114)/(59-73) 109/59 mmHg (12/16 0343) SpO2:  [95 %-98 %] 95 % (12/16 0343) Last BM Date: 08/24/15  Intake/Output from previous day: 12/15 0701 - 12/16 0700 In: 2945.8 [I.V.:2945.8] Out: 1075 [Urine:125; Emesis/NG output:950] Intake/Output this shift:    Abdomen distended, minimally tender  Lab Results:   Recent Labs  08/27/15 0540 08/21/2015 0526  WBC 8.7 8.9  HGB 8.4* 8.5*  HCT 26.5* 26.9*  PLT 156 170   BMET  Recent Labs  08/25/2015 0940 08/27/15 0540  NA 132* 136  K 3.9 4.1  CL 94* 104  CO2 26 25  GLUCOSE 113* 114*  BUN 29* 16  CREATININE 1.31* 0.91  CALCIUM 8.8* 7.9*   PT/INR  Recent Labs  08/27/15 1149 08/14/2015 0526  LABPROT 27.8* 17.1*  INR 2.64* 1.38   ABG No results for input(s): PHART, HCO3 in the last 72 hours.  Invalid input(s): PCO2, PO2  Studies/Results: Dg Chest 2 View  08/25/2015  CLINICAL DATA:  54 year old male with right lower quadrant abdominal pain and lower extremity swelling since last week. Nausea and vomiting for 2 weeks. Weakness. Initial encounter. EXAM: CHEST  2 VIEW COMPARISON:  CT Abdomen and Pelvis from today reported separately, and earlier. FINDINGS: Lung volumes are within normal limits. Normal cardiac size and mediastinal contours. No pneumothorax or pulmonary edema. No pleural effusion or consolidation. No pulmonary nodule identified. No acute osseous abnormality identified. Abnormal bowel gas in the abdomen, see comparison. IMPRESSION: No acute cardiopulmonary abnormality. See CT Abdomen and Pelvis from today reported separately. Electronically Signed   By: Genevie Ann M.D.   On: 08/18/2015 11:21   Dg Abd 1 View  09/09/2015  CLINICAL DATA:  Right abdominal pain and distention for 2 weeks. Constipation.  EXAM: ABDOMEN - 1 VIEW COMPARISON:  07/14/2015 FINDINGS: There is marked distention of a loop of bowel, presumably colon. This finding is associated with a relative paucity of remaining bowel gas with a minimal amount of air seen within several loops of small bowel with index loop of small bowel in the left upper abdominal quadrant with index loop of presumably small bowel within the left upper abdominal quadrant measuring approximately 4.3 cm in diameter. Nondiagnostic evaluation for pneumoperitoneum secondary to exclusion of the lower thorax. No definite pneumatosis or portal venous gas. No definite abnormal intra-abdominal calcifications. IMPRESSION: Findings worrisome for intestinal volvulus, potentially a cecal volvulus. Further evaluation with abdominal CT could be performed as indicated. Critical Value/emergent results were called by telephone at the time of interpretation on 08/20/2015 at 10:04 am to Dr. Davonna Belling , who verbally acknowledged these results. Electronically Signed   By: Sandi Mariscal M.D.   On: 09/11/2015 10:07   Ct Abdomen Pelvis W Contrast  09/04/2015  ADDENDUM REPORT: 08/27/2015 11:52 ADDENDUM: Study discussed by telephone with Dr. Davonna Belling on 08/13/2015 at 1132 hours. Electronically Signed   By: Genevie Ann M.D.   On: 09/11/2015 11:52  09/01/2015  CLINICAL DATA:  54 year old male with abdominal pain for 2 weeks and lower extremity swelling greater on the right. Unintentional weight loss for 8 weeks. Abnormal bowel gas pattern today. Initial encounter. EXAM: CT ABDOMEN AND PELVIS WITH CONTRAST TECHNIQUE: Multidetector CT imaging of the abdomen and pelvis was performed using the standard protocol following bolus administration of  intravenous contrast. CONTRAST:  149mL OMNIPAQUE IOHEXOL 300 MG/ML  SOLN COMPARISON:  Radiographs from today and earlier. FINDINGS: There are a small number of subtle lung base pulmonary nodules (left lung series 3, images 1 and 2). There is a 12 mm  more irregular pulmonary nodule in the right lateral costophrenic angle. No pleural effusion. No pericardial effusion. No destructive or suspicious osseous lesion identified. However, there is a a suspicious small right posterior iliac muscle mass measuring 11 mm on series 2, image 69. No abdominal free air. There is trace abdominal free fluid primarily in the right gutter. There is an obstructing tumor of the colon at the hepatic flexure with indistinct margins suggesting mesenteric invasion and adjacent mesenteric nodal disease. The mass encompasses 55 x 66 x 46 mm (AP by transverse by CC). Surrounding irregular mesenteric nodal disease. Associated ipsilateral and contralateral retroperitoneal nodal disease, up to 17 mm short axis at the level of the left main renal vessels. Widespread hepatic metastatic disease. Hepatic metastases individually up to 5.5 cm diameter. This cecum is massively distended. The terminal ileum and distal small bowel are obstructed as expected and measure up to 4-1/2 cm diameter. Distal to the obstructing hepatic flexure tumor the colon is decompressed, with diverticulosis. Diminutive urinary bladder. Diminutive stomach. The tumor is inseparable from the second portion of the duodenum on coronal. The duodenum otherwise appears within normal limits. There is a small indeterminate 11 mm hypodense lesion in the spleen. The pancreas is within normal limits. 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 The kidneys are within normal limits. The portal venous system is patent. Major arterial structures in the abdomen and pelvis are patent. There is an indistinct appearance of both common femoral veins in the pelvis on series 2, image 85. Internal all decreased density at both. No inguinal lymphadenopathy. The IVC is patent. The common iliac veins are patent on the delayed images. No pelvic lymphadenopathy. IMPRESSION: 1. Sequelae of obstructing Stage IV  Colon Cancer at the hepatic flexure. Extensive hepatic metastases. Extensive nodal metastases. Left adrenal metastasis. 2. Severely obstructed cecum and distal small bowel. No free air. Trace free fluid. 3. Bilateral common femoral venous thrombosis suspected. Other major vascular structures appear patent. 4. Small indeterminate lung base nodules, indeterminate lesions in the spleen, and right posterior pelvic musculature. Electronically Signed: By: Genevie Ann M.D. On: 08/27/2015 11:30    Anti-infectives: Anti-infectives    None      Assessment/Plan:  Stage IV colon cancer  He remains totally obstructed.  INR has corrected. Plan exploratory laparotomy today with possible resection of the colon mass and ostomy to relieve the obstruction.  I discussed the risks which includes but is not limited to bleeding, infection, injury to surrounding structures, need for bowel resection and ostomy, need for further surgery, cardiopulmonary issues, etc.  He agrees to proceed.  Heparin held.  LOS: 2 days    Alene Bergerson A 08/30/2015

## 2015-08-28 NOTE — Anesthesia Procedure Notes (Signed)
Procedure Name: Intubation Date/Time: 09/08/2015 11:45 AM Performed by: Terrill Mohr Pre-anesthesia Checklist: Patient identified, Emergency Drugs available, Suction available and Patient being monitored Patient Re-evaluated:Patient Re-evaluated prior to inductionOxygen Delivery Method: Circle system utilized Preoxygenation: Pre-oxygenation with 100% oxygen Intubation Type: IV induction Ventilation: Mask ventilation without difficulty Laryngoscope Size: Mac, 3 and Glidescope Grade View: Grade III Tube type: Subglottic suction tube Tube size: 7.5 mm Number of attempts: 2 (KJ unable to make curve up into cords; NGT in way; Dr,. Moser removed NGT and accomplished ETT) Airway Equipment and Method: Stylet and Video-laryngoscopy Placement Confirmation: ETT inserted through vocal cords under direct vision,  breath sounds checked- equal and bilateral and positive ETCO2 Secured at: 23 (cm at teeth) cm Tube secured with: Tape Dental Injury: Teeth and Oropharynx as per pre-operative assessment

## 2015-08-28 NOTE — Op Note (Signed)
EXPLORATORY LAPAROTOMY, OSTOMY, PARTIAL COLECTOMY, ILEOSTOMY  Procedure Note  Jesse Murray 08/25/2015 - 08/27/2015   Pre-op Diagnosis: metastatic cancer     Post-op Diagnosis: metastatic cancer  Procedure(s): EXPLORATORY LAPAROTOMY PARTIAL COLECTOMY ILEOSTOMY MUCUS FISTULA  Surgeon(s): Coralie Keens, MD  Anesthesia: General  Staff:  Circulator: Candie Mile, RN Relief Circulator: Thelma Barge, RN Relief Scrub: Thelma Barge, RN Scrub Person: Milas Kocher, RN  Estimated Blood Loss: Minimal               Specimens: sent to path  Findings:  Metastatic tumor implants throughout the abdomen.  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

## 2015-08-28 NOTE — Anesthesia Preprocedure Evaluation (Addendum)
Anesthesia Evaluation  Patient identified by MRN, date of birth, ID band Patient awake    Reviewed: Allergy & Precautions, NPO status , Patient's Chart, lab work & pertinent test results  History of Anesthesia Complications Negative for: history of anesthetic complications  Airway Mallampati: II  TM Distance: >3 FB Neck ROM: Full    Dental  (+) Teeth Intact, Dental Advisory Given   Pulmonary neg pulmonary ROS,    breath sounds clear to auscultation       Cardiovascular + Peripheral Vascular Disease   Rhythm:Regular     Neuro/Psych  Headaches, negative psych ROS   GI/Hepatic Stage 4 colon ca with bowel obstruction and liver mets   Endo/Other    Renal/GU ARFRenal disease     Musculoskeletal   Abdominal   Peds  Hematology  (+) anemia , DVTs in heparin gtt   Anesthesia Other Findings Pt has an existing NGT.  His neck extension is a bit limited by this.  His mouth opening is modest.  His chin to cricoid distance is a bit short.  Reproductive/Obstetrics                            Anesthesia Physical Anesthesia Plan  ASA: III  Anesthesia Plan: General   Post-op Pain Management:    Induction: Intravenous  Airway Management Planned: Oral ETT  Additional Equipment: None  Intra-op Plan:   Post-operative Plan: Extubation in OR  Informed Consent: I have reviewed the patients History and Physical, chart, labs and discussed the procedure including the risks, benefits and alternatives for the proposed anesthesia with the patient or authorized representative who has indicated his/her understanding and acceptance.   Dental advisory given  Plan Discussed with: CRNA and Surgeon  Anesthesia Plan Comments:         Anesthesia Quick Evaluation

## 2015-08-28 NOTE — Progress Notes (Signed)
Patient back from PACU. Verified PCA with Marc Morgans, RN. Report given to patient's RN Georgina Peer.

## 2015-08-28 NOTE — Transfer of Care (Signed)
Immediate Anesthesia Transfer of Care Note  Patient: Jesse Murray  Procedure(s) Performed: Procedure(s) with comments: EXPLORATORY LAPAROTOMY (N/A) OSTOMY (N/A) - Mucous Ostomy PARTIAL COLECTOMY (N/A) ILEOSTOMY (N/A)  Patient Location: PACU  Anesthesia Type:General  Level of Consciousness: awake, alert  and patient cooperative  Airway & Oxygen Therapy: Patient Spontanous Breathing and Patient connected to face mask oxygen  Post-op Assessment: Report given to RN, Post -op Vital signs reviewed and stable and Patient moving all extremities  Post vital signs: Reviewed and stable  Last Vitals:  Filed Vitals:   09/01/2015 0343 08/18/2015 1337  BP: 109/59 128/79  Pulse: 86 96  Temp: 36.8 C   Resp: 20 23    Complications: No apparent anesthesia complications

## 2015-08-28 NOTE — Progress Notes (Signed)
ANTICOAGULATION CONSULT NOTE - Follow Up Consult  Pharmacy Consult for heparin Indication: bilateral common femoral venous thrombosis per CT  No Known Allergies  Patient Measurements: Height: 5\' 8"  (172.7 cm) Weight: 160 lb 8 oz (72.802 kg) IBW/kg (Calculated) : 68.4 Heparin Dosing Weight: 68 kg  Vital Signs: Temp: 98.3 F (36.8 C) (12/16 0343) Temp Source: Oral (12/15 2058) BP: 109/59 mmHg (12/16 0343) Pulse Rate: 86 (12/16 0343)  Labs:  Recent Labs  08/19/2015 0940 08/16/2015 1515 09/12/2015 2015 08/27/15 0540 08/27/15 0846 08/27/15 1149 08/27/15 1814 09/06/2015 0526  HGB 9.7*  --   --  8.4*  --   --   --  8.5*  HCT 29.4*  --   --  26.5*  --   --   --  26.9*  PLT 185  --   --  156  --   --   --  170  APTT  --  81*  --   --   --   --   --   --   LABPROT  --  32.2*  --   --   --  27.8*  --  17.1*  INR  --  3.21*  --   --   --  2.64*  --  1.38  HEPARINUNFRC  --   --  0.11*  --  0.17*  --  0.30  --   CREATININE 1.31*  --   --  0.91  --   --   --   --    Assessment: Pharmacy consulted to dose heparin for bilateral common femoral venous thrombosis per CT in setting of stage IV colon cancer with SBO and extensive hepatic metastases (INR 3.21 on admission with no anti-coag PTA). Received Vit K 1 mg IV on 12/15 and subsequent INR is 1.38. Heparin held this morning and planning for exploratory laparotomy today with possible resection of the colon mass and ostomy to relieve the obstruction.  Pertinent levels: HL 0.11 on 1200 units/hr HL 0.17 on 1400 units/hr HL 0.3 on 1600 units/hr   Goal of Therapy:  Heparin level 0.3-0.7 units/ml Monitor platelets by anticoagulation protocol: Yes   Plan:  1. Continue holding heparin as sx planned 2. F/u when appropriate to resume after sx (d/w Dr. Ninfa Linden and can likely resume 6 - 8 hours after depending on extent of sx) 3. Will need to address long term anti-coagulation when stable 4. Daily HL and CBC q 72H   Vincenza Hews, PharmD,  BCPS 08/25/2015, 8:44 AM Pager: [phone]

## 2015-08-28 NOTE — Progress Notes (Signed)
Daily Progress Note   Patient Name: Jesse Murray       Date: 09/05/2015 DOB: [birth year]  Age: 54 y.o. MRN#: [id number] Attending Physician: Kinnie Feil, MD Primary Care Physician: No PCP Per Patient Admit Date: 09/09/2015  Reason for Consultation/Follow-up: Establishing goals of care, Non pain symptom management, Pain control and Psychosocial/spiritual support  Subjective:  -follow up visit this morning with patient for continued support 2/2 to yesterdays GOC.  He anticipates surgery this morning.  He "hopes" the surgery helps but "is worried" about the cancer  -emotional support offered, quiet presence  -pateint has asked that I contact several people and update:  Amy Jones/sister- # [phone]  His sister plans to come to [facility] on Dec 26-30th.  His parents are ill and live in [place].  Unfortunately family will be unable to offer much support in the future.  Amy verbalizes concern and love for her brother.   Durwood Collier/ neighbor  # [phone]-- Octavio Graves # [phone]   David/chruch # [phone]      Length of Stay: 2 days  Current Medications: Scheduled Meds:  .  ceFAZolin (ANCEF) IV  2 g Intravenous Once  . [MAR Hold] pantoprazole (PROTONIX) IV  40 mg Intravenous QHS    Continuous Infusions: . dextrose 5 % and 0.9% NaCl 125 mL/hr at 08/25/2015 0709  . lactated ringers 50 mL/hr at 08/16/2015 1023    PRN Meds: [MAR Hold]  HYDROmorphone (DILAUDID) injection, [MAR Hold] LORazepam, [MAR Hold] ondansetron **OR** [MAR Hold] ondansetron (ZOFRAN) IV  Physical Exam: Physical Exam  Constitutional: He is oriented to person, place, and time. He appears cachectic. He is cooperative. He appears ill.  Cardiovascular: Normal rate, regular rhythm and normal  heart sounds.   Pulmonary/Chest: He has decreased breath sounds in the right lower field and the left lower field.  Abdominal: He exhibits distension. There is hepatomegaly. There is generalized tenderness.  Neurological: He is alert and oriented to person, place, and time.  Skin: Skin is warm and dry.                Vital Signs: BP 109/59 mmHg  Pulse 86  Temp(Src) 98.3 F (36.8 C) (Oral)  Resp 20  Ht 5\' 8"  (1.727 m)  Wt 72.802 kg (160 lb 8 oz)  BMI 24.41 kg/m2  SpO2 95% SpO2: SpO2: 95 % O2 Device: O2 Device: Not Delivered O2 Flow Rate:    Intake/output summary:  Intake/Output Summary (Last 24 hours) at 08/15/2015 1036 Last data filed at 08/19/2015 0905  Gross per 24 hour  Intake 2945.83 ml  Output   1225 ml  Net 1720.83 ml   LBM: Last BM Date: 08/24/15 Baseline Weight: Weight: 68.04 kg (150 lb) Most recent weight: Weight: 72.802 kg (160 lb 8 oz)       Palliative Assessment/Data: Flowsheet Rows        Most Recent Value   Intake Tab    Referral Department  -- [Family Medicine]   Unit at Time of Referral  Med/Surg Unit   Palliative Care Primary Diagnosis  Cancer   Date Notified  08/19/2015   Palliative Care Type  New Palliative care   Reason for referral  Clarify Goals of Care   Date of Admission  08/29/2015   Date first seen by Palliative Care  08/15/2015   # of days Palliative referral response time  0 Day(s)   # of days IP prior to Palliative referral  0   Clinical Assessment    Psychosocial & Spiritual Assessment    Palliative Care Outcomes       Additional Data Reviewed: CBC    Component Value Date/Time   WBC 8.9 08/22/2015 0526   WBC 11.1* 07/14/2015 1745   RBC 3.02* 08/19/2015 0526   RBC 4.22* 07/14/2015 1745   HGB 8.5* 09/07/2015 0526   HGB 12.3* 07/14/2015 1745   HCT 26.9* 09/05/2015 0526   HCT 36.5* 07/14/2015 1745   PLT 170 09/05/2015 0526   MCV 89.1 09/02/2015 0526   MCV 86.3 07/14/2015 1745   MCH 28.1 08/14/2015 0526   MCH 29.2 07/14/2015 1745    MCHC 31.6 08/16/2015 0526   MCHC 33.8 07/14/2015 1745   RDW 16.2* 08/23/2015 0526   LYMPHSABS 2.1 08/20/2015 0940   MONOABS 0.9 09/03/2015 0940   EOSABS 0.4 09/12/2015 0940   BASOSABS 0.1 08/17/2015 0940    CMP     Component Value Date/Time   NA 136 08/27/2015 0540   K 4.1 08/27/2015 0540   CL 104 08/27/2015 0540   CO2 25 08/27/2015 0540   GLUCOSE 114* 08/27/2015 0540   BUN 16 08/27/2015 0540   CREATININE 0.91 08/27/2015 0540   CREATININE 0.69* 07/14/2015 1732   CALCIUM 7.9* 08/27/2015 0540   PROT 5.3* 08/27/2015 1149   ALBUMIN 2.2* 08/27/2015 1149   AST 35 08/27/2015 1149   ALT 22 08/27/2015 1149   ALKPHOS 702* 08/27/2015 1149   BILITOT 1.6* 08/27/2015 1149   GFRNONAA >60 08/27/2015 0540   GFRAA >60 08/27/2015 0540       Problem List:  Patient Active Problem List   Diagnosis Date Noted  . Acute deep vein thrombosis (DVT) of proximal vein of lower extremity (HCC)   . Metastatic cancer ([place])   . Palliative care encounter 08/27/2015  . DNR (do not resuscitate) discussion 08/27/2015  . Pain, cancer 08/27/2015  . Colon cancer ([place]) 09/09/2015  . Abdominal pain 08/19/2015     Palliative Care Assessment & Plan    Code Status:  Full code      Code Status Orders        Start     Ordered   09/10/2015 1401  Full code   Continuous     08/28/2015 1404        Goals of Care/Additional Recommendations:   Surgery today,  treat the treatable,oncology consult in near future and "one day at a time".  Patient understands the overall poor prognosis but at this point in time things will have to play out.    Desire for further Chaplaincy support:no-strong community church support  Psycho-social Needs: Education on Hospice and Medicaid/Financial Assistance   Symptom Management:      1.  Discussed with Dr Rush Farmer and recommendation for PCA post-op.  PMT will continue to follow for symptom recommendations   6. Discharge Planning:  Pending   Thank you for  allowing the Palliative Medicine Team to assist in the care of this patient.   Time In: 1000 Time Out: 1035 Total Time 35  min Prolonged Time Billed  no         Knox Royalty, NP  08/27/2015, 10:36 AM  Please contact Palliative Medicine Team phone at [phone] for questions and concerns.

## 2015-08-28 NOTE — Progress Notes (Signed)
CSW received consult regarding financial will assistance.   CSW spoke w/ patient to tell him that the Hospital is unable to change financial wills. Patient expressed understanding.  CSW signing off.  Jesse Murray LCSWA [phone]

## 2015-08-29 ENCOUNTER — Inpatient Hospital Stay (HOSPITAL_COMMUNITY): Payer: Medicaid Other

## 2015-08-29 DIAGNOSIS — D649 Anemia, unspecified: Secondary | ICD-10-CM | POA: Insufficient documentation

## 2015-08-29 DIAGNOSIS — R1084 Generalized abdominal pain: Secondary | ICD-10-CM

## 2015-08-29 DIAGNOSIS — G893 Neoplasm related pain (acute) (chronic): Secondary | ICD-10-CM

## 2015-08-29 LAB — BASIC METABOLIC PANEL
ANION GAP: 8 (ref 5–15)
Anion gap: 6 (ref 5–15)
BUN: 14 mg/dL (ref 6–20)
BUN: 13 mg/dL (ref 6–20)
CALCIUM: 7.5 mg/dL — AB (ref 8.9–10.3)
CALCIUM: 7.3 mg/dL — AB (ref 8.9–10.3)
CHLORIDE: 114 mmol/L — AB (ref 101–111)
CO2: 23 mmol/L (ref 22–32)
CO2: 21 mmol/L — AB (ref 22–32)
CREATININE: 1.04 mg/dL (ref 0.61–1.24)
Chloride: 111 mmol/L (ref 101–111)
Creatinine, Ser: 1.26 mg/dL — ABNORMAL HIGH (ref 0.61–1.24)
GFR calc non Af Amer: >60 mL/min (ref >60)
GLUCOSE: 136 mg/dL — AB (ref 65–99)
Glucose, Bld: 122 mg/dL — ABNORMAL HIGH (ref 65–99)
POTASSIUM: 4 mmol/L (ref 3.5–5.1)
Potassium: 3.8 mmol/L (ref 3.5–5.1)
SODIUM: 142 mmol/L (ref 135–145)
SODIUM: 141 mmol/L (ref 135–145)

## 2015-08-29 LAB — CBC
HCT: 27.7 % — ABNORMAL LOW (ref 39.0–52.0)
HEMOGLOBIN: 8.4 g/dL — AB (ref 13.0–17.0)
MCH: 27.5 pg (ref 26.0–34.0)
MCHC: 30.3 g/dL (ref 30.0–36.0)
MCV: 90.5 fL (ref 78.0–100.0)
PLATELETS: 176 10*3/uL (ref 150–400)
RBC: 3.06 MIL/uL — AB (ref 4.22–5.81)
RDW: 16.7 % — ABNORMAL HIGH (ref 11.5–15.5)
WBC: 15.3 10*3/uL — AB (ref 4.0–10.5)

## 2015-08-29 LAB — GLUCOSE, CAPILLARY: GLUCOSE-CAPILLARY: 134 mg/dL — AB (ref 65–99)

## 2015-08-29 LAB — HEPARIN LEVEL (UNFRACTIONATED)
HEPARIN UNFRACTIONATED: 0.35 [IU]/mL (ref 0.30–0.70)
HEPARIN UNFRACTIONATED: 0.3 [IU]/mL (ref 0.30–0.70)

## 2015-08-29 MED ORDER — FAT EMULSION 20 % IV EMUL: 240.0000 mL | INTRAVENOUS | Status: DC

## 2015-08-29 MED ORDER — FAT EMULSION 20 % IV EMUL
240.0000 mL | INTRAVENOUS | Status: AC
  Administered 2015-08-29: 240 mL via INTRAVENOUS
  Filled 2015-08-29: qty 250

## 2015-08-29 MED ORDER — SODIUM CHLORIDE 0.9 % IV BOLUS (SEPSIS)
500.0000 mL | Freq: Once | INTRAVENOUS | Status: AC
  Administered 2015-08-29: 500 mL via INTRAVENOUS

## 2015-08-29 MED ORDER — SODIUM CHLORIDE 0.9 % IJ SOLN
10.0000 mL | INTRAMUSCULAR | Status: DC | PRN
  Administered 2015-08-30: 20 mL
  Administered 2015-08-30: 10 mL
  Administered 2015-08-30: 20 mL
  Administered 2015-08-31 – 2015-09-02 (×3): 10 mL
  Administered 2015-09-02: 20 mL
  Administered 2015-09-03 – 2015-09-11 (×3): 10 mL
  Filled 2015-08-29 (×10): qty 40

## 2015-08-29 MED ORDER — KCL IN DEXTROSE-NACL 20-5-0.9 MEQ/L-%-% IV SOLN
INTRAVENOUS | Status: AC
Start: 2015-08-29 — End: 2015-08-30
  Administered 2015-08-29: 12:00:00 via INTRAVENOUS
  Filled 2015-08-29 (×6): qty 1000

## 2015-08-29 MED ORDER — DEXTROSE-NACL 5-0.45 % IV SOLN
INTRAVENOUS | Status: DC
  Administered 2015-08-29: 11:00:00 via INTRAVENOUS

## 2015-08-29 MED ORDER — ACETAMINOPHEN 10 MG/ML IV SOLN
1000.0000 mg | Freq: Four times a day (QID) | INTRAVENOUS | Status: AC
  Administered 2015-08-29 – 2015-08-30 (×4): 1000 mg via INTRAVENOUS
  Filled 2015-08-29 (×7): qty 100

## 2015-08-29 MED ORDER — TRACE MINERALS CR-CU-MN-SE-ZN 10-1000-500-60 MCG/ML IV SOLN
INTRAVENOUS | Status: AC
  Administered 2015-08-29: 18:00:00 via INTRAVENOUS
  Filled 2015-08-29: qty 960

## 2015-08-29 MED ORDER — CLINIMIX E/DEXTROSE (5/15) 5 % IV SOLN: INTRAVENOUS | Status: DC

## 2015-08-29 MED ORDER — SODIUM CHLORIDE 0.9 % IJ SOLN
10.0000 mL | Freq: Two times a day (BID) | INTRAMUSCULAR | Status: DC
Start: 2015-08-29 — End: 2015-09-14
  Administered 2015-08-31 – 2015-09-02 (×3): 10 mL
  Administered 2015-09-06: 20 mL
  Administered 2015-09-07 – 2015-09-11 (×6): 10 mL

## 2015-08-29 MED ORDER — HYDROMORPHONE HCL 1 MG/ML IJ SOLN
0.5000 mg | INTRAMUSCULAR | Status: DC | PRN
  Administered 2015-08-29 – 2015-08-31 (×4): 0.5 mg via INTRAVENOUS
  Filled 2015-08-29 (×4): qty 1

## 2015-08-29 MED ORDER — INSULIN ASPART 100 UNIT/ML ~~LOC~~ SOLN: 0.0000 [IU] | SUBCUTANEOUS | Status: DC

## 2015-08-29 MED ORDER — HEPARIN (PORCINE) IN NACL 100-0.45 UNIT/ML-% IJ SOLN
1950.0000 [IU]/h | INTRAMUSCULAR | Status: DC
  Administered 2015-08-30: 1950 [IU]/h via INTRAVENOUS
  Administered 2015-08-30: 1800 [IU]/h via INTRAVENOUS
  Filled 2015-08-29 (×3): qty 250

## 2015-08-29 NOTE — Anesthesia Postprocedure Evaluation (Signed)
Anesthesia Post Note  Patient: Jesse Murray  Procedure(s) Performed: Procedure(s) (LRB): EXPLORATORY LAPAROTOMY (N/A) OSTOMY (N/A) PARTIAL COLECTOMY (N/A) ILEOSTOMY (N/A)  Patient location during evaluation: PACU Anesthesia Type: General Level of consciousness: awake Pain management: pain level controlled Vital Signs Assessment: post-procedure vital signs reviewed and stable Respiratory status: spontaneous breathing Cardiovascular status: stable Postop Assessment: no signs of nausea or vomiting Anesthetic complications: no    Last Vitals:  Filed Vitals:   08/29/15 0800 08/29/15 1159  BP:    Pulse:    Temp:    Resp: 18 18    Last Pain:  Filed Vitals:   08/29/15 1200  PainSc: 6                  Gio Janoski

## 2015-08-29 NOTE — Progress Notes (Signed)
Family Medicine Teaching Service Daily Progress Note Intern Pager: [phone]  Patient name: Jesse Murray Medical record number: [id number] Date of birth: 1961-03-16 Age: 54 y.o. Gender: male  Primary Care Provider: No PCP Per Patient Consultants: surgery Code Status: FULL  Patient overview and Major Events to Date: 12/14 CT showing abdominal mass and SBO 12/16 ex-lap with extensive abdominal mets, suspected pancreatic primary non-resectable, ileostomy  Assessment and Plan: DORVIN KREBS is a 54 y.o. male presenting with abdominal pain, as well as bilateral leg swelling and pain. PMH is significant for EtOH abuse.   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 Ex-lap 12/16 found primary to be most likely pancreatic that is invading hepatic flexure; unresectable but ileostomy was done to help alleviate symptoms.  - NG tube placed for decompression >> significant output overnight after surgery, will continue for now - IVF is D5 NS @ 125cc/hr >> once bag is finished will change to D5 1/2NS @ 125cc/hr (noted Sodium going from 132>136>142 on NS fluids). - dilaudid PCA for pain control - Palliative care continuing to follow pt - Nutrition consult for malnourishment - PT/OT consult when appropriate for deconditioning - oncology consulted, to see on Monday, appreciate recommendations  #Leg pain/swelling 2/2 bilateral DVTs: Most likely DVT due to hypercoagulable state from malignancy and/or recent inactivity after lying in bed for three weeks. Well's criteria for DVT 7 (high risk for DVT). Well's criteria for PE 5.5 (moderate risk). Physical exam also consistent with DVT. Some improvement in pain/swelling today. Patient denies difficulty breathing.  - Consider CTA chest if difficulty breathing to rule out PE - Receiving  heparin gtt for presumed DVT - Depending on clot burden patient may be a candidate for IVC filter  #Anemia: Hgb 9.7 on admission (12.3 one month ago). May be related to malignancy. FOBT negative. Patient currently denying bleeding or any symptoms of anemia. Vitals are stable. PT elevated at 32, APTT elevated at 81. INR improved to 1.3. Hgb stable 8.5>8.4.  - Monitor  - Transfusion threshold <7  #AKI: Unsure of baseline Cr but one month ago was 0.69. On admission 1.31. Patient did receive IV contrast in ED for CT scan. Also with decreased PO intake likely a prerenal etiology as well. BUN/Cr ratio >20. Cr improved to 0.91 (12/15).  - Avoid nephrotoxic agents including contrast - IV hydration  - I/Os  FEN/GI: NPO, Protonix IV, D5NS @125ml /hr Prophylaxis: SCDs  Subjective:  Complains of feeling like NG tube is coming out. Having pain but says it is controlled with the PCA. Denies any shortness of breath.  Objective: Temp:  [97.4 F (36.3 C)-98.6 F (37 C)] 97.6 F (36.4 C) (12/17 0412) Pulse Rate:  [96-122] 101 (12/17 0412) Resp:  [16-29] 16 (12/17 0611) BP: (103-128)/(58-79) 103/58 mmHg (12/17 0412) SpO2:  [96 %-100 %] 99 % (12/17 0611) FiO2 (%):  [97 %] 97 % (12/16 2000) Physical Exam: General: lying in bed in NAD, NG tube in place with good output Cardiovascular: RRR, no murmurs appreciated Respiratory: CTAB, no wheezing, no increased WOB Abdomen: clean dressing over midline incision, right sided ileostomy with some output Extremities: 1+ pitting edema to knee on RLE with 3+ pitting edema of foot to ankle; 3+ edema of L foot to ankle  Laboratory:  Recent Labs Lab 08/27/15 0540 09/03/2015 0526 08/29/15 0535  WBC 8.7 8.9 15.3*  HGB  8.4* 8.5* 8.4*  HCT 26.5* 26.9* 27.7*  PLT 156 170 176    Recent Labs Lab 08/31/2015 0940 08/27/15 0540 08/27/15 1149 08/29/15 0535  NA 132* 136  --  142  K 3.9 4.1  --  4.0  CL 94* 104  --  111  CO2 26 25  --  23  BUN 29* 16  --  14   CREATININE 1.31* 0.91  --  1.26*  CALCIUM 8.8* 7.9*  --  7.5*  PROT 7.4  --  5.3*  --   BILITOT 1.9*  --  1.6*  --   ALKPHOS 989*  --  702*  --   ALT 30  --  22  --   AST 49*  --  35  --   GLUCOSE 113* 114*  --  136*    Imaging/Diagnostic Tests: None new  Leone Brand, MD 08/29/2015, 8:24 AM PGY-3, [place] Intern pager: [phone], text pages welcome

## 2015-08-29 NOTE — Progress Notes (Signed)
Peripherally Inserted Central Catheter/Midline Placement  The IV Nurse has discussed with the patient and/or persons authorized to consent for the patient, the purpose of this procedure and the potential benefits and risks involved with this procedure.  The benefits include less needle sticks, lab draws from the catheter and patient may be discharged home with the catheter.  Risks include, but not limited to, infection, bleeding, blood clot (thrombus formation), and puncture of an artery; nerve damage and irregular heat beat.  Alternatives to this procedure were also discussed.  PICC/Midline Placement Documentation  PICC Double Lumen [id number] PICC Right Basilic 42 cm 1 cm (Active)  Indication for Insertion or Continuance of Line Administration of hyperosmolar/irritating solutions (i.e. TPN, Vancomycin, etc.) 08/29/2015  2:15 PM  Exposed Catheter (cm) 1 cm 08/29/2015  2:15 PM  Site Assessment Clean;Dry;Intact 08/29/2015  2:15 PM  Lumen #1 Status Flushed;Saline locked;Blood return noted 08/29/2015  2:15 PM  Lumen #2 Status Flushed;Saline locked;Blood return noted 08/29/2015  2:15 PM  Dressing Type Transparent 08/29/2015  2:15 PM  Dressing Change Due 09/05/15 08/29/2015  2:15 PM       Gordan Payment 08/29/2015, 2:17 PM

## 2015-08-29 NOTE — Progress Notes (Signed)
Hand off report to Denise, RN.

## 2015-08-29 NOTE — Progress Notes (Signed)
Nutrition Follow-up  DOCUMENTATION CODES:   Severe malnutrition in context of chronic illness   Pt meets criteria for SEVERE MALNUTRITION in the context of chronic illness (newly dx metastatic pancreatic cancer) as evidenced by severe fat and muscle depletion, 21 % weight loss x 3 months, intake </= 75% of his needs for >/= 1 month.  INTERVENTION:   TPN  NUTRITION DIAGNOSIS:   Inadequate oral intake related to altered GI function as evidenced by NPO status. Ongoing.   GOAL:   Patient will meet greater than or equal to 90% of their needs Not yet met.   MONITOR:   Diet advancement, Labs, Weight trends, Skin, I & O's  REASON FOR ASSESSMENT:   Consult New TPN/TNA  ASSESSMENT:   RANDOL ZUMSTEIN is a 54 y.o. male presenting with abdominal pain, as well as bilateral leg swelling and pain. PMH is significant for EtOH abuse.   12/17: Exp lap, partial colectomy, with ileostomy  Pt with intraabdominal metastatic cancer (colon vs pancreatic). Per notes pt's cancer is inoperable and colon surgery was for palliative reasons.  Per pt he has lost 40 lb since October and was not eating PTA. (21% weight loss) Nutrition-Focused physical exam completed. Findings are severe fat depletion, severe muscle depletion, and severe edema. LE with severe edema skewing exam.  Current weight reflects edema.   NG tube to suction, > 1 L out. Remains NPO Starting TPN today. No oncology consult yet.  Palliative care following, per note overall poor prognosis but plan to treat the treatable and get oncology consult.   Diet Order:  Diet NPO time specified TPN (CLINIMIX-E) Adult  Skin:  Reviewed, no issues  Last BM:  12/17 250 via ileostomy  Height:   Ht Readings from Last 1 Encounters:  08/28/2015 '5\' 8"'$  (1.727 m)   Weight:   Wt Readings from Last 1 Encounters:  08/18/2015 160 lb 8 oz (72.802 kg)  Admission weight 150 lb  Current weight reflects edema  Ideal Body Weight:  70 kg  BMI:  Body  mass index is 24.41 kg/(m^2).  Estimated Nutritional Needs:   Kcal:  2000-2200  Protein:  95-110 grams  Fluid:  2.0-2.2 L  EDUCATION NEEDS:   No education needs identified at this time  [place], [place], [place] Pager [phone] After Hours Pager

## 2015-08-29 NOTE — Progress Notes (Signed)
ANTICOAGULATION CONSULT NOTE - Follow Up Consult  Pharmacy Consult for heparin  Indication: : bilateral common femoral venous thrombosis   No Known Allergies  Patient Measurements: Height: 5\' 8"  (172.7 cm) Weight: 160 lb 8 oz (72.802 kg) IBW/kg (Calculated) : 68.4  Vital Signs: Temp: 97.7 F (36.5 C) (12/17 1613) Temp Source: Oral (12/17 1613) BP: 89/55 mmHg (12/17 1613) Pulse Rate: 95 (12/17 1613)  Labs:  Recent Labs  08/27/15 0540  08/27/15 1149 08/27/15 1814 08/27/2015 0526 08/29/15 0535 08/29/15 1505  HGB 8.4*  --   --   --  8.5* 8.4*  --   HCT 26.5*  --   --   --  26.9* 27.7*  --   PLT 156  --   --   --  170 176  --   LABPROT  --   --  27.8*  --  17.1*  --   --   INR  --   --  2.64*  --  1.38  --   --   HEPARINUNFRC  --   < >  --  0.30  --  0.35 0.30  CREATININE 0.91  --   --   --   --  1.26* 1.04  < > = values in this interval not displayed.  Estimated Creatinine Clearance: 79.5 mL/min (by C-G formula based on Cr of 1.04).   Medications:  Scheduled:  . acetaminophen  1,000 mg Intravenous 4 times per day  . HYDROmorphone   Intravenous 6 times per day  . insulin aspart  0-9 Units Subcutaneous 6 times per day  . pantoprazole (PROTONIX) IV  40 mg Intravenous QHS  . sodium chloride  10-40 mL Intracatheter Q12H   Infusions:  . dextrose 5 % and 0.9 % NaCl with KCl 20 mEq/L 125 mL/hr at 08/29/15 1206  . Marland KitchenTPN (CLINIMIX-E) Adult     And  . fat emulsion    . heparin      Assessment: 54 yo male with bilateral DVT on heparin and also noted with metastatic cancer and s/p partial colectomy, Ileostomy. Plans noted for possible IVC filter. -Heparin level= 0.3 and at goal  Goal of Therapy:  Heparin level 0.3-0.7 units/ml Monitor platelets by anticoagulation protocol: Yes   Plan:  -Increase heparin to 1800 units/hr -Daily heparin level and CBC  Hildred Laser, Pharm D 08/29/2015 5:11 PM   e

## 2015-08-29 NOTE — Op Note (Signed)
NAMESELENA, Jesse Murray              ACCOUNT NO.:  [id number]  MEDICAL RECORD NO.:  [id number]  LOCATION:  [facility]                         FACILITY:  [facility]  PHYSICIAN:  Coralie Keens, M.D. DATE OF BIRTH:  [birth year]  DATE OF PROCEDURE:  08/25/2015 DATE OF DISCHARGE:                              OPERATIVE REPORT   PREOPERATIVE DIAGNOSIS:  Metastatic cancer.  POSTOPERATIVE DIAGNOSIS:  Metastatic cancer.  PROCEDURES: 1. Exploratory laparotomy. 2. Partial colectomy. 3. Ileostomy. 4. Mucous fistula.  SURGEON:  Coralie Keens, M.D.  ASSISTANT:  Sampson Goon, RN.  ANESTHESIA:  General endotracheal anesthesia.  ESTIMATED BLOOD LOSS:  Minimal.  INDICATIONS:  This is an unfortunate 54 year old gentleman who presents with metastatic intraabdominal cancer.  He has an obstruction of his colon which is complete at the hepatic flexure.  He has tumour throughout his liver and suspected tumor potentially in his lungs. Given his total obstruction, decision had been made to proceed with exploratory laparotomy with possible resection and ostomy.  FINDINGS:  The patient was found to have carcinomatosis with tumor nodules throughout the abdomen.  These included the liver and diaphragm and pelvis.  The mass appeared to be arising from the pancreas growing into the hepatic flexure of the colon and was completely unresectable. The cecum was massively distended.  I had to perform a partial colectomy in order to remove this and then pulled out a mucous fistula and ileostomy through the same incision.  PROCEDURE IN DETAIL:  The patient was brought to the operating room, identified as Jesse Murray.  He was placed upon the operating room table, and general anesthesia was induced.  His abdomen was then prepped and draped in usual sterile fashion.  I created a midline incision with a scalpel, took this down through the fascia with electrocautery.  I then opened the perineum the entire length of  the incision.  Upon entering the abdomen, the patient was found to have ascites.  Tremor nodules were found to be located throughout the abdomen.  The patient had what appeared to be a large tumor arising from the head of the pancreas going into and fixated to the hepatic flexure of the colon. There were large amount of tumor nodules in both lobes of the liver and along the diaphragm and pelvic brim.  The cecum was massively dilated, and the appendix was pulled up into the tumor retrocecally.  At this point, I transected the small bowel proximal to the ileocecal valve and then was able to transect the colon just proximal to the malignancy.  I then took down the mesentery with a Harmonic Scalpel as well as 2-0 silk sutures.  The specimen was then sent to Pathology for evaluation.  It was difficult to pull the appendix off the tumor, but I was able to completely do this without entering the appendix.  At this point, it was determined that the malignancy again was unresectable.  There was a moderate amount of bleeding from the mesentery right at the colon the was remaining proximal to the obstruction.  I had to place several silk sutures there and use Surgicel to achieve hemostasis.  Hemostasis appeared to be achieved.  I then copiously irrigated the abdomen  with several liters of normal saline.  At this point, an elliptical incision was made in the patient's right lower quadrant.  I took this down to the fascia which I opened in a cruciate fashion.  I then separated the underlying muscle fibers and opened up the peritoneum.  I then first pulled the remaining colon that was just slightly proximal to the tumor through this and a small end of the previous staple line to create a mucous fistula.  I attached this to the fascia with silk sutures.  I then pulled up the proximal small bowel as in the ileostomy.  I opened up the colon with staple line a small area and created a small mucous fistula.   I then created an end-ileostomy through the same incision right adjacent to the mucous fistula with interrupted 3-0 Vicryl sutures.  A pink well-perfused ostomy and mucous fistula appeared to be achieved.  At this point, hemostasis appeared to be achieved.  The patient's midline fascia was then closed with running looped PDS suture. The skin was then irrigated and closed with skin staples.  The patient tolerated the procedure well.  All of the counts were correct at the end of procedure.  The patient was then extubated in the operating room and taken in a stable condition to the Recovery.     Coralie Keens, M.D.     DB/MEDQ  D:  08/25/2015  T:  08/29/2015  Job:  [id number]

## 2015-08-29 NOTE — Progress Notes (Signed)
ANTICOAGULATION CONSULT NOTE - Follow Up Consult  Pharmacy Consult for heparin Indication: bilateral common femoral venous thrombosis  Labs:  Recent Labs  08/25/2015 0940 08/14/2015 1515  08/27/15 0540 08/27/15 0846 08/27/15 1149 08/27/15 1814 08/29/2015 0526 08/29/15 0535  HGB 9.7*  --   --  8.4*  --   --   --  8.5* 8.4*  HCT 29.4*  --   --  26.5*  --   --   --  26.9* 27.7*  PLT 185  --   --  156  --   --   --  170 176  APTT  --  81*  --   --   --   --   --   --   --   LABPROT  --  32.2*  --   --   --  27.8*  --  17.1*  --   INR  --  3.21*  --   --   --  2.64*  --  1.38  --   HEPARINUNFRC  --   --   < >  --  0.17*  --  0.30  --  0.35  CREATININE 1.31*  --   --  0.91  --   --   --   --   --   < > = values in this interval not displayed.   Assessment/Plan:  54yo male therapeutic on heparin after resumed post-op. Will continue gtt at current rate and confirm stable with additional level.   Wynona Neat, PharmD, BCPS  08/29/2015,7:41 AM

## 2015-08-29 NOTE — Progress Notes (Signed)
1 Day Post-Op  Subjective: He is miserable, NG is turned all the way up, continuous suction, 500 in the cannister, he is in pain, the only relief is to not move.  He is at least a week out with nothing to eat and little to drink. Very little urine recorded.  No BS, he is very tender, cannot move, IS is 6 feet away from him.  Dressing is dry.  Lungs are clear anterior exam. 98%, HR 99, BP 106/60   Objective: Vital signs in last 24 hours: Temp:  [97.4 F (36.3 C)-98.6 F (37 C)] 97.6 F (36.4 C) (12/17 0412) Pulse Rate:  [96-122] 101 (12/17 0412) Resp:  [16-29] 16 (12/17 0611) BP: (103-128)/(58-79) 103/58 mmHg (12/17 0412) SpO2:  [96 %-100 %] 99 % (12/17 0611) FiO2 (%):  [97 %] 97 % (12/16 2000) Last BM Date: 08/29/15 Aferbile, tachycardic sats OK on [place] Creatinine and WBC is up Some anemia with surgery and hydration  urin3e only 500 recorded  . dextrose 5 % and 0.9% NaCl 1,000 mL (08/29/15 0342)  . heparin 1,700 Units/hr (08/27/2015 2107)   Intake/Output from previous day: 12/16 0701 - 12/17 0700 In: 5652.6 [I.V.:5597.6; IV Piggyback:55] Out: 1110 [Urine:500; Emesis/NG output:50; Stool:550; Blood:10] Intake/Output this shift:    General appearance: alert, cooperative and miserable,  Resp: clear to auscultation bilaterally GI: tender, dressing intact and dry, he is in allot of pain, can't really move.    Lab Results:   Recent Labs  09/05/2015 0526 08/29/15 0535  WBC 8.9 15.3*  HGB 8.5* 8.4*  HCT 26.9* 27.7*  PLT 170 176    BMET  Recent Labs  08/27/15 0540 08/29/15 0535  NA 136 142  K 4.1 4.0  CL 104 111  CO2 25 23  GLUCOSE 114* 136*  BUN 16 14  CREATININE 0.91 1.26*  CALCIUM 7.9* 7.5*   PT/INR  Recent Labs  08/27/15 1149 08/27/2015 0526  LABPROT 27.8* 17.1*  INR 2.64* 1.38     Recent Labs Lab 09/10/2015 0940 08/27/15 1149  AST 49* 35  ALT 30 22  ALKPHOS 989* 702*  BILITOT 1.9* 1.6*  PROT 7.4 5.3*  ALBUMIN 3.0* 2.2*     Lipase     Component  Value Date/Time   LIPASE 31 09/09/2015 0940     Studies/Results: No results found.  Medications: . HYDROmorphone   Intravenous 6 times per day  . pantoprazole (PROTONIX) IV  40 mg Intravenous QHS   . dextrose 5 % and 0.9 % NaCl with KCl 20 mEq/L    . 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 prealbumin 7.6  Plan:  I am going to increase his fluid with a bolus, work on pain relief, PICC and TNA for support/malnutriton.  i am going to change his IV fluids some also, recheck his BMP later today so we can keep an good watch on renal function.  Leave foley in for now.  Transfer to 6N.  I will start dressing changes later after we get his pain controlled.  Add some additional dilaudid and IV tylenol for pain control.  Recheck labs in AM.  Ice chips for now.    LOS: 3 days    Jesse Murray  08/29/2015   

## 2015-08-29 NOTE — Progress Notes (Signed)
[place] NOTE   Pharmacy Consult for TPN Indication: SBO, malnutrition in the setting of colon ca  No Known Allergies  Patient Measurements: Height: _0  (172.7 cm) Weight: 160 lb 8 oz (72.802 kg) IBW/kg (Calculated) : 68.4  Vital Signs: Temp: 97.6 F (36.4 C) (12/17 0412) Temp Source: Oral (12/17 0412) BP: 103/58 mmHg (12/17 0412) Pulse Rate: 101 (12/17 0412) Intake/Output from previous day: 12/16 0701 - 12/17 0700 In: 5652.6 [I.V.:5597.6; IV Piggyback:55] Out: 1110 [Urine:500; Emesis/NG output:50; Stool:550; Blood:10] Intake/Output from this shift:    Labs:  Recent Labs  09/07/2015 1515 08/27/15 0540 08/27/15 1149 09/01/2015 0526 08/29/15 0535  WBC  --  8.7  --  8.9 15.3*  HGB  --  8.4*  --  8.5* 8.4*  HCT  --  26.5*  --  26.9* 27.7*  PLT  --  156  --  170 176  APTT 81*  --   --   --   --   INR 3.21*  --  2.64* 1.38  --      Recent Labs  09/10/2015 1515 08/27/15 0540 08/27/15 1149 08/29/15 0535  NA  --  136  --  142  K  --  4.1  --  4.0  CL  --  104  --  111  CO2  --  25  --  23  GLUCOSE  --  114*  --  136*  BUN  --  16  --  14  CREATININE  --  0.91  --  1.26*  CALCIUM  --  7.9*  --  7.5*  MG 2.2  --   --   --   PHOS 4.0  --   --   --   PROT  --   --  5.3*  --   ALBUMIN  --   --  2.2*  --   AST  --   --  35  --   ALT  --   --  22  --   ALKPHOS  --   --  702*  --   BILITOT  --   --  1.6*  --   BILIDIR  --   --  0.9*  --   IBILI  --   --  0.7  --   PREALBUMIN  --  7.6*  --   --    Estimated Creatinine Clearance: 65.6 mL/min (by C-G formula based on Cr of 1.26).   No results for input(s): GLUCAP in the last 72 hours.  Medical History: Past Medical History  Diagnosis Date  . DVT (deep venous thrombosis) ([place]) dx'd ~ 08/22/2015    RLE  . Small bowel obstruction ([place]) 08/25/2015  . Abdominal mass 08/25/2015  . Headache     "weekly" (08/25/2015)  . Colon cancer ([place]) dx'd 09/08/2015    stage IV/notes 08/21/2015   Insulin  Requirements in the past 24 hours:  No insulin ordered at this time  Current Nutrition:  NPO  Assessment: 69 yom presented to the hospital with abdominal pain, nausea and vomiting. Found to have metastatic cancer (colon vs pancreatic) with massive cecal dilitation. Pt has had significant weight loss and poor appetite over the past month. Palliative care is involved.   Surgeries/Procedures: 12/16 Exlap, partial colectomy, ileostomy and mucus fistula  GI: s/p exlap yesterday - ongoing weight loss and poor PO intake for several weeks. Prealbumin low at 7.6 Endo: No hx DM - AM glucose 136 Lytes: Lytes WNL Renal: SCr  1.26, UOP 0.3 Pulm: 98% 2L [place] Cards: BP 103/58, HR 101 Hepatobil: Alk phos elevated, Tbili mildly elevated at 1.6 - possibly related to liver mets Neuro: Hx EtOH abuse - Dilaudid PCA, APAP scheduled Q6H - CPOT = 8 ID: Afebrile, WBC 15.3, no abx   Best Practices: SCDs TPN Access: PICC (ordered to be placed 12/17) TPN start date: 12/17>>  Nutritional Goals:  Per RD 12/15: 2000-2200 kCal/day 95-110 grams of protein per day  Plan:  - Start Clinimix E5/15 at 19m/hr + lipids 20% at 162mhr - provides 48gm protein (50% goal) and 1162 Kcal (58% goal) - MVI + TE daily in TPN - Reduce MIVF to 853mr when TPN starts - Start SSI tonight to assess glucose control on TPN - Consult RD for further nutrition recommendations - TPN labs in AM  Maurizio Geno, [employer]/17/2016,11:41 AM

## 2015-08-30 DIAGNOSIS — M79604 Pain in right leg: Secondary | ICD-10-CM | POA: Insufficient documentation

## 2015-08-30 DIAGNOSIS — M79605 Pain in left leg: Secondary | ICD-10-CM

## 2015-08-30 DIAGNOSIS — Z7189 Other specified counseling: Secondary | ICD-10-CM

## 2015-08-30 LAB — CBC
HCT: 22.4 % — ABNORMAL LOW (ref 39.0–52.0)
HEMATOCRIT: 23.4 % — AB (ref 39.0–52.0)
Hemoglobin: 7.1 g/dL — ABNORMAL LOW (ref 13.0–17.0)
Hemoglobin: 7.3 g/dL — ABNORMAL LOW (ref 13.0–17.0)
MCH: 28.6 pg (ref 26.0–34.0)
MCH: 28 pg (ref 26.0–34.0)
MCHC: 31.7 g/dL (ref 30.0–36.0)
MCHC: 31.2 g/dL (ref 30.0–36.0)
MCV: 90.3 fL (ref 78.0–100.0)
MCV: 89.7 fL (ref 78.0–100.0)
PLATELETS: 134 10*3/uL — AB (ref 150–400)
PLATELETS: 146 10*3/uL — AB (ref 150–400)
RBC: 2.48 MIL/uL — AB (ref 4.22–5.81)
RBC: 2.61 MIL/uL — ABNORMAL LOW (ref 4.22–5.81)
RDW: 16.8 % — ABNORMAL HIGH (ref 11.5–15.5)
RDW: 16.8 % — AB (ref 11.5–15.5)
WBC: 11.5 10*3/uL — AB (ref 4.0–10.5)
WBC: 12.4 10*3/uL — AB (ref 4.0–10.5)

## 2015-08-30 LAB — DIFFERENTIAL
BASOS ABS: 0 10*3/uL (ref 0.0–0.1)
BASOS PCT: 0 %
Eosinophils Absolute: 0.4 10*3/uL (ref 0.0–0.7)
Eosinophils Relative: 4 %
LYMPHS ABS: 1.6 10*3/uL (ref 0.7–4.0)
Lymphocytes Relative: 14 %
MONOS PCT: 5 %
Monocytes Absolute: 0.6 10*3/uL (ref 0.1–1.0)
NEUTROS ABS: 8.9 10*3/uL — AB (ref 1.7–7.7)
NEUTROS PCT: 77 %

## 2015-08-30 LAB — COMPREHENSIVE METABOLIC PANEL
ALBUMIN: 1.5 g/dL — AB (ref 3.5–5.0)
ALT: 18 U/L (ref 17–63)
AST: 30 U/L (ref 15–41)
Alkaline Phosphatase: 387 U/L — ABNORMAL HIGH (ref 38–126)
Anion gap: 5 (ref 5–15)
BUN: 12 mg/dL (ref 6–20)
CHLORIDE: 115 mmol/L — AB (ref 101–111)
CO2: 22 mmol/L (ref 22–32)
Calcium: 7.3 mg/dL — ABNORMAL LOW (ref 8.9–10.3)
Creatinine, Ser: 0.81 mg/dL (ref 0.61–1.24)
GFR calc Af Amer: >60 mL/min (ref >60)
GFR calc non Af Amer: >60 mL/min (ref >60)
GLUCOSE: 126 mg/dL — AB (ref 65–99)
POTASSIUM: 3.7 mmol/L (ref 3.5–5.1)
SODIUM: 142 mmol/L (ref 135–145)
Total Bilirubin: 1.3 mg/dL — ABNORMAL HIGH (ref 0.3–1.2)
Total Protein: 4.6 g/dL — ABNORMAL LOW (ref 6.5–8.1)

## 2015-08-30 LAB — MAGNESIUM: Magnesium: 1.7 mg/dL (ref 1.7–2.4)

## 2015-08-30 LAB — HEPARIN LEVEL (UNFRACTIONATED)
HEPARIN UNFRACTIONATED: 0.24 [IU]/mL — AB (ref 0.30–0.70)
HEPARIN UNFRACTIONATED: 0.23 [IU]/mL — AB (ref 0.30–0.70)
Heparin Unfractionated: 0.31 IU/mL (ref 0.30–0.70)

## 2015-08-30 LAB — TRIGLYCERIDES: Triglycerides: 100 mg/dL (ref <150)

## 2015-08-30 LAB — GLUCOSE, CAPILLARY
GLUCOSE-CAPILLARY: 103 mg/dL — AB (ref 65–99)
GLUCOSE-CAPILLARY: 104 mg/dL — AB (ref 65–99)
GLUCOSE-CAPILLARY: 111 mg/dL — AB (ref 65–99)
GLUCOSE-CAPILLARY: 113 mg/dL — AB (ref 65–99)
GLUCOSE-CAPILLARY: 116 mg/dL — AB (ref 65–99)
GLUCOSE-CAPILLARY: 119 mg/dL — AB (ref 65–99)
GLUCOSE-CAPILLARY: 94 mg/dL (ref 65–99)

## 2015-08-30 LAB — PHOSPHORUS: Phosphorus: 2.6 mg/dL (ref 2.5–4.6)

## 2015-08-30 LAB — TROPONIN I: TROPONIN I: 0.04 ng/mL — AB (ref <0.031)

## 2015-08-30 MED ORDER — KCL IN DEXTROSE-NACL 20-5-0.9 MEQ/L-%-% IV SOLN
INTRAVENOUS | Status: DC
  Administered 2015-08-30 – 2015-09-02 (×3): via INTRAVENOUS
  Filled 2015-08-30 (×2): qty 1000

## 2015-08-30 MED ORDER — PHENOL 1.4 % MT LIQD: 1.0000 | OROMUCOSAL | Status: DC | PRN

## 2015-08-30 MED ORDER — HEPARIN (PORCINE) IN NACL 100-0.45 UNIT/ML-% IJ SOLN
2400.0000 [IU]/h | INTRAMUSCULAR | Status: DC
  Administered 2015-08-30: 2100 [IU]/h via INTRAVENOUS
  Administered 2015-08-31 – 2015-09-02 (×5): 2200 [IU]/h via INTRAVENOUS
  Administered 2015-09-03: 2250 [IU]/h via INTRAVENOUS
  Filled 2015-08-30 (×12): qty 250

## 2015-08-30 MED ORDER — FAT EMULSION 20 % IV EMUL
240.0000 mL | INTRAVENOUS | Status: AC
  Administered 2015-08-30: 240 mL via INTRAVENOUS
  Filled 2015-08-30: qty 250

## 2015-08-30 MED ORDER — TRACE MINERALS CR-CU-MN-SE-ZN 10-1000-500-60 MCG/ML IV SOLN
INTRAVENOUS | Status: AC
  Administered 2015-08-30: 18:00:00 via INTRAVENOUS
  Filled 2015-08-30: qty 1992

## 2015-08-30 NOTE — Progress Notes (Signed)
Family Medicine Teaching Service Daily Progress Note Intern Pager: [phone]  Patient name: Jesse Murray Medical record number: [id number] Date of birth: 06-Aug-1961 Age: 54 y.o. Gender: male  Primary Care Provider: No PCP Per Patient Consultants: surgery Code Status: FULL  Patient overview and Major Events to Date: 12/14 CT showing abdominal mass and SBO 12/16 ex-lap with extensive abdominal mets, suspected pancreatic primary non-resectable, ileostomy  Assessment and Plan: Jesse Murray is a 54 y.o. male presenting with abdominal pain, as well as bilateral leg swelling and pain. PMH is significant for EtOH abuse.   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 Ex-lap 12/16 found primary to be most likely pancreatic that is invading hepatic flexure; unresectable but ileostomy was done to help alleviate symptoms.  - NG tube placed for decompression >> significant output overnight after surgery, will continue for now - IVF is D5 NS @ 85 cc/hr - dilaudid PCA for pain control - Palliative care continuing to follow pt - Nutrition consult for malnourishment - PT/OT consult when appropriate for deconditioning - oncology consulted, to see on Monday, appreciate recommendations  #Leg pain/swelling likely 2/2 bilateral DVTs: Most likely DVT due to hypercoagulable state from malignancy and/or recent inactivity after lying in bed for three weeks. Well's criteria for DVT 7 (high risk for DVT). Well's criteria for PE 5.5 (moderate risk). Physical exam also consistent with DVT. Denies leg pain but still markedly edematous. Patient denies difficulty breathing.  - Consider CTA chest if difficulty breathing to rule out PE - Receiving heparin gtt for presumed DVT - Depending on clot burden patient may be a candidate for IVC  filter  #Anemia: Hgb 9.7 on admission (12.3 one month ago). May be related to malignancy. FOBT negative. Patient currently denying bleeding or any symptoms of anemia. Vitals are stable. PT elevated at 32, APTT elevated at 81. INR improved to 1.3. Hgb 8.5>8.4>7.1. No signs of bleeding potentially just hemodilution as all cell lines have decreased - repeat CBC 13:00 12/18 - Transfusion threshold <7  #AKI: Unsure of baseline Cr but one month ago was 0.69. On admission 1.31. Patient did receive IV contrast in ED for CT scan. Also with decreased PO intake likely a prerenal etiology as well. BUN/Cr ratio >20. Cr improved to 0.81 (12/18).  - Avoid nephrotoxic agents including contrast - IV hydration  - I/Os  FEN/GI: TPN, NPO, Protonix IV, D5NS @85ml /hr Prophylaxis: SCDs  Subjective:  Reports mild abd pain, unchanged from prior, denies chest pain, SOB or leg pain   Objective: Temp:  [97.2 F (36.2 C)-98.6 F (37 C)] 97.2 F (36.2 C) (12/18 0450) Pulse Rate:  [84-98] 84 (12/18 0630) Resp:  [7-23] 19 (12/18 0805) BP: (83-104)/(54-60) 97/54 mmHg (12/18 0630) SpO2:  [92 %-100 %] 100 % (12/18 0805) Physical Exam: General: lying in bed in NAD, NG tube in place with good output Cardiovascular: RRR, no murmurs appreciated Respiratory: CTAB, no wheezing, no increased WOB Abdomen: clean dressing over midline incision, right sided ileostomy with good output Extremities: 1+ pitting edema to knee on RLE with 3+ pitting edema of foot to ankle; 3+ edema of L foot to ankle, 1+ pitting edema to knee of LLE  Laboratory:  Recent Labs Lab 08/27/2015 0526 08/29/15 0535 08/30/15 0447  WBC 8.9 15.3* 11.5*  HGB 8.5* 8.4* 7.1*  HCT 26.9* 27.7* 22.4*  PLT 170 176 134*  Recent Labs Lab 09/09/2015 0940  08/27/15 1149 08/29/15 0535 08/29/15 1505 08/30/15 0447  NA 132*  < >  --  142 141 142  K 3.9  < >  --  4.0 3.8 3.7  CL 94*  < >  --  111 114* 115*  CO2 26  < >  --  23 21* 22  BUN 29*  < >  --   14 13 12   CREATININE 1.31*  < >  --  1.26* 1.04 0.81  CALCIUM 8.8*  < >  --  7.5* 7.3* 7.3*  PROT 7.4  --  5.3*  --   --  4.6*  BILITOT 1.9*  --  1.6*  --   --  1.3*  ALKPHOS 989*  --  702*  --   --  387*  ALT 30  --  22  --   --  18  AST 49*  --  35  --   --  30  GLUCOSE 113*  < >  --  136* 122* 126*  < > = values in this interval not displayed.  Imaging/Diagnostic Tests: None new  Veatrice Bourbon, MD 08/30/2015, 9:00 AM PGY-2, [place] Intern pager: [phone], text pages welcome

## 2015-08-30 NOTE — Progress Notes (Signed)
Patient ID: Jesse Murray, male   DOB: 13-Feb-1961, 54 y.o.   MRN: [id number] [facility] Surgery Progress Note:   2 Days Post-Op  Subjective: Mental status is more alert.  Transferred to 6N where he seems to be receiving more attention Objective: Vital signs in last 24 hours: Temp:  [97.2 F (36.2 C)-98.6 F (37 C)] 97.2 F (36.2 C) (12/18 0450) Pulse Rate:  [84-98] 84 (12/18 0630) Resp:  [7-23] 19 (12/18 0805) BP: (83-104)/(54-60) 97/54 mmHg (12/18 0630) SpO2:  [92 %-100 %] 100 % (12/18 0805)  Intake/Output from previous day: 12/17 0701 - 12/18 0700 In: 2559.4 [P.O.:160; I.V.:1804.4; TPN:595] Out: 1050 [Urine:850; Emesis/NG output:150; Stool:50] Intake/Output this shift: Total I/O In: -  Out: 50 [Stool:50]  Physical Exam: Work of breathing is not labored.  Complaining of NG discomfort  Lab Results:  Results for orders placed or performed during the hospital encounter of 09/01/2015 (from the past 48 hour(s))  CBC     Status: Abnormal   Collection Time: 08/29/15  5:35 AM  Result Value Ref Range   WBC 15.3 (H) 4.0 - 10.5 K/uL   RBC 3.06 (L) 4.22 - 5.81 MIL/uL   Hemoglobin 8.4 (L) 13.0 - 17.0 g/dL   HCT 97.0 (L) 26.3 - 78.5 %   MCV 90.5 78.0 - 100.0 fL   MCH 27.5 26.0 - 34.0 pg   MCHC 30.3 30.0 - 36.0 g/dL   RDW 88.5 (H) 02.7 - 74.1 %   Platelets 176 150 - 400 K/uL  Heparin level (unfractionated)     Status: None   Collection Time: 08/29/15  5:35 AM  Result Value Ref Range   Heparin Unfractionated 0.35 0.30 - 0.70 IU/mL    Comment:        IF HEPARIN RESULTS ARE BELOW EXPECTED VALUES, AND PATIENT DOSAGE HAS BEEN CONFIRMED, SUGGEST FOLLOW UP TESTING OF ANTITHROMBIN III LEVELS.   Basic metabolic panel     Status: Abnormal   Collection Time: 08/29/15  5:35 AM  Result Value Ref Range   Sodium 142 135 - 145 mmol/L   Potassium 4.0 3.5 - 5.1 mmol/L   Chloride 111 101 - 111 mmol/L   CO2 23 22 - 32 mmol/L   Glucose, Bld 136 (H) 65 - 99 mg/dL   BUN 14 6 - 20 mg/dL    Creatinine, Ser 2.87 (H) 0.61 - 1.24 mg/dL   Calcium 7.5 (L) 8.9 - 10.3 mg/dL   GFR calc non Af Amer >60 >60 mL/min   GFR calc Af Amer >60 >60 mL/min    Comment: (NOTE) The eGFR has been calculated using the CKD EPI equation. This calculation has not been validated in all clinical situations. eGFR's persistently <60 mL/min signify possible Chronic Kidney Disease.    Anion gap 8 5 - 15  Glucose, capillary     Status: Abnormal   Collection Time: 08/29/15 12:53 PM  Result Value Ref Range   Glucose-Capillary 134 (H) 65 - 99 mg/dL   Comment 1 Notify RN   Heparin level (unfractionated)     Status: None   Collection Time: 08/29/15  3:05 PM  Result Value Ref Range   Heparin Unfractionated 0.30 0.30 - 0.70 IU/mL    Comment:        IF HEPARIN RESULTS ARE BELOW EXPECTED VALUES, AND PATIENT DOSAGE HAS BEEN CONFIRMED, SUGGEST FOLLOW UP TESTING OF ANTITHROMBIN III LEVELS.   Basic metabolic panel     Status: Abnormal   Collection Time: 08/29/15  3:05 PM  Result Value Ref Range   Sodium 141 135 - 145 mmol/L   Potassium 3.8 3.5 - 5.1 mmol/L   Chloride 114 (H) 101 - 111 mmol/L   CO2 21 (L) 22 - 32 mmol/L   Glucose, Bld 122 (H) 65 - 99 mg/dL   BUN 13 6 - 20 mg/dL   Creatinine, Ser 5.22 0.61 - 1.24 mg/dL   Calcium 7.3 (L) 8.9 - 10.3 mg/dL   GFR calc non Af Amer >60 >60 mL/min   GFR calc Af Amer >60 >60 mL/min    Comment: (NOTE) The eGFR has been calculated using the CKD EPI equation. This calculation has not been validated in all clinical situations. eGFR's persistently <60 mL/min signify possible Chronic Kidney Disease.    Anion gap 6 5 - 15  Glucose, capillary     Status: Abnormal   Collection Time: 08/30/15  1:29 AM  Result Value Ref Range   Glucose-Capillary 119 (H) 65 - 99 mg/dL  Glucose, capillary     Status: Abnormal   Collection Time: 08/30/15  4:11 AM  Result Value Ref Range   Glucose-Capillary 113 (H) 65 - 99 mg/dL  CBC     Status: Abnormal   Collection Time: 08/30/15   4:47 AM  Result Value Ref Range   WBC 11.5 (H) 4.0 - 10.5 K/uL   RBC 2.48 (L) 4.22 - 5.81 MIL/uL   Hemoglobin 7.1 (L) 13.0 - 17.0 g/dL   HCT 61.6 (L) 74.6 - 28.2 %   MCV 90.3 78.0 - 100.0 fL   MCH 28.6 26.0 - 34.0 pg   MCHC 31.7 30.0 - 36.0 g/dL   RDW 86.6 (H) 89.3 - 75.9 %   Platelets 134 (L) 150 - 400 K/uL  Heparin level (unfractionated)     Status: Abnormal   Collection Time: 08/30/15  4:47 AM  Result Value Ref Range   Heparin Unfractionated 0.24 (L) 0.30 - 0.70 IU/mL    Comment:        IF HEPARIN RESULTS ARE BELOW EXPECTED VALUES, AND PATIENT DOSAGE HAS BEEN CONFIRMED, SUGGEST FOLLOW UP TESTING OF ANTITHROMBIN III LEVELS.   Comprehensive metabolic panel     Status: Abnormal   Collection Time: 08/30/15  4:47 AM  Result Value Ref Range   Sodium 142 135 - 145 mmol/L   Potassium 3.7 3.5 - 5.1 mmol/L   Chloride 115 (H) 101 - 111 mmol/L   CO2 22 22 - 32 mmol/L   Glucose, Bld 126 (H) 65 - 99 mg/dL   BUN 12 6 - 20 mg/dL   Creatinine, Ser 1.97 0.61 - 1.24 mg/dL   Calcium 7.3 (L) 8.9 - 10.3 mg/dL   Total Protein 4.6 (L) 6.5 - 8.1 g/dL   Albumin 1.5 (L) 3.5 - 5.0 g/dL   AST 30 15 - 41 U/L   ALT 18 17 - 63 U/L   Alkaline Phosphatase 387 (H) 38 - 126 U/L   Total Bilirubin 1.3 (H) 0.3 - 1.2 mg/dL   GFR calc non Af Amer >60 >60 mL/min   GFR calc Af Amer >60 >60 mL/min    Comment: (NOTE) The eGFR has been calculated using the CKD EPI equation. This calculation has not been validated in all clinical situations. eGFR's persistently <60 mL/min signify possible Chronic Kidney Disease.    Anion gap 5 5 - 15  Magnesium     Status: None   Collection Time: 08/30/15  4:47 AM  Result Value Ref Range   Magnesium 1.7 1.7 -  2.4 mg/dL  Phosphorus     Status: None   Collection Time: 08/30/15  4:47 AM  Result Value Ref Range   Phosphorus 2.6 2.5 - 4.6 mg/dL  Differential     Status: Abnormal   Collection Time: 08/30/15  4:47 AM  Result Value Ref Range   Neutrophils Relative % 77 %    Neutro Abs 8.9 (H) 1.7 - 7.7 K/uL   Lymphocytes Relative 14 %   Lymphs Abs 1.6 0.7 - 4.0 K/uL   Monocytes Relative 5 %   Monocytes Absolute 0.6 0.1 - 1.0 K/uL   Eosinophils Relative 4 %   Eosinophils Absolute 0.4 0.0 - 0.7 K/uL   Basophils Relative 0 %   Basophils Absolute 0.0 0.0 - 0.1 K/uL  Triglycerides     Status: None   Collection Time: 08/30/15  4:47 AM  Result Value Ref Range   Triglycerides 100 <150 mg/dL  Glucose, capillary     Status: Abnormal   Collection Time: 08/30/15  7:17 AM  Result Value Ref Range   Glucose-Capillary 111 (H) 65 - 99 mg/dL    Radiology/Results: Dg Chest Port 1 View  08/29/2015  CLINICAL DATA:  Encounter for PICC line placement EXAM: PORTABLE CHEST 1 VIEW COMPARISON:  09/03/2015 FINDINGS: Nasogastric tube is in the stomach. There is a right arm PICC line with tip in the cavoatrial junction. Normal heart size. Atelectasis is identified in the left base. IMPRESSION: Right arm PICC line tip is in the cavoatrial junction. Left base atelectasis. Electronically Signed   By: Kerby Moors M.D.   On: 08/29/2015 14:52    Anti-infectives: Anti-infectives    Start     Dose/Rate Route Frequency Ordered Stop   09/01/2015 0815  ceFAZolin (ANCEF) IVPB 2 g/50 mL premix     2 g 100 mL/hr over 30 Minutes Intravenous  Once 09/01/2015 0804 08/30/2015 1145   08/21/2015 0815  metroNIDAZOLE (FLAGYL) IVPB 500 mg    Comments:  OR holding area   500 mg 100 mL/hr over 60 Minutes Intravenous  Once 09/05/2015 0804 09/02/2015 0919      Assessment/Plan: Problem List: Patient Active Problem List   Diagnosis Date Noted  . Absolute anemia   . Malignant neoplasm of hepatic flexure ([place])   . Acute deep vein thrombosis (DVT) of proximal vein of lower extremity (H[facility])   . Metastatic cancer ([place])   . Palliative care encounter 08/27/2015  . DNR (do not resuscitate) discussion 08/27/2015  . Pain, cancer 08/27/2015  . Colon cancer ([place]) 08/27/2015  . Abdominal pain 09/09/2015     Chloraseptic spray to throat 2 Days Post-Op    LOS: 4 days   Matt B. Hassell Done, MD, [facility] [phone] beeper [phone]  08/30/2015 8:29 AM

## 2015-08-30 NOTE — Progress Notes (Signed)
ANTICOAGULATION CONSULT NOTE - Follow Up Consult  Pharmacy Consult for heparin Indication: bilateral common femoral venous thrombosis   Labs:  Recent Labs  09/03/2015 0526  08/29/15 0535 08/29/15 1505 08/30/15 0447 08/30/15 0855 08/30/15 1450 08/30/15 2207  HGB 8.5*  --  8.4*  --  7.1*  --  7.3*  --   HCT 26.9*  --  27.7*  --  22.4*  --  23.4*  --   PLT 170  --  176  --  134*  --  146*  --   LABPROT 17.1*  --   --   --   --   --   --   --   INR 1.38  --   --   --   --   --   --   --   HEPARINUNFRC  --   < > 0.35 0.30 0.24*  --  0.23* 0.31  CREATININE  --   --  1.26* 1.04 0.81  --   --   --   TROPONINI  --   --   --   --   --  0.04*  --   --   < > = values in this interval not displayed.   Assessment: 54yo male now therapeutic on heparin after rate increase though at very low end of goal.  Goal of Therapy:  Heparin level 0.3-0.7 units/ml   Plan:  Will increase heparin gtt slightly to 2200 units/hr to prevent dropping below goal and check level with am labs.  Wynona Neat, PharmD, BCPS  08/30/2015,10:58 PM

## 2015-08-30 NOTE — Progress Notes (Signed)
ANTICOAGULATION CONSULT NOTE - Follow Up Consult  Pharmacy Consult for heparin  Indication: : bilateral common femoral venous thrombosis   No Known Allergies  Patient Measurements: Height: 5\' 8"  (172.7 cm) Weight: 160 lb 8 oz (72.802 kg) IBW/kg (Calculated) : 68.4  Vital Signs: Temp: 97.2 F (36.2 C) (12/18 0450) Temp Source: Oral (12/18 0450) BP: 99/55 mmHg (12/18 0450) Pulse Rate: 86 (12/18 0450)  Labs:  Recent Labs  08/27/15 1149  09/03/2015 0526 08/29/15 0535 08/29/15 1505 08/30/15 0447  HGB  --   < > 8.5* 8.4*  --  7.1*  HCT  --   --  26.9* 27.7*  --  22.4*  PLT  --   --  170 176  --  134*  LABPROT 27.8*  --  17.1*  --   --   --   INR 2.64*  --  1.38  --   --   --   HEPARINUNFRC  --   < >  --  0.35 0.30 0.24*  CREATININE  --   --   --  1.26* 1.04  --   < > = values in this interval not displayed.  Estimated Creatinine Clearance: 79.5 mL/min (by C-G formula based on Cr of 1.04).   Assessment: 54 yo male with bilateral DVT on heparin and also noted with metastatic cancer and s/p partial colectomy, Ileostomy. Plans noted for possible IVC filter. Heparin level below goal at 0.24 on 1800 units/hr. Hgb down to 7.1, plt down to 134. No overt bleeding noted. No issues with line per RN.  Goal of Therapy:  Heparin level 0.3-0.7 units/ml Monitor platelets by anticoagulation protocol: Yes   Plan:  -Increase heparin to 1950 units/hr -F/u 6 hr heparin level -Follow CBC closely and f/u s/s bleeding  Sherlon Handing, PharmD, BCPS Clinical pharmacist, pager [phone]  08/30/2015 6:21 AM

## 2015-08-30 NOTE — Progress Notes (Signed)
PARENTERAL NUTRITION CONSULT NOTE   Pharmacy Consult for TPN Indication: SBO, malnutrition in the setting of metastatic ca  No Known Allergies  Patient Measurements: Height: '5\' 8"'$  (172.7 cm) Weight: 160 lb 8 oz (72.802 kg) IBW/kg (Calculated) : 68.4  Vital Signs: Temp: 97.2 F (36.2 C) (12/18 0450) Temp Source: Oral (12/18 0450) BP: 97/54 mmHg (12/18 0630) Pulse Rate: 84 (12/18 0630) Intake/Output from previous day: 12/17 0701 - 12/18 0700 In: 1334.4 [P.O.:160; I.V.:929.4; TPN:245] Out: 1050 [Urine:850; Emesis/NG output:150; Stool:50] Intake/Output from this shift: Total I/O In: -  Out: 50 [Stool:50]  Labs:  Recent Labs  08/27/15 1149 08/27/2015 0526 08/29/15 0535 08/30/15 0447  WBC  --  8.9 15.3* 11.5*  HGB  --  8.5* 8.4* 7.1*  HCT  --  26.9* 27.7* 22.4*  PLT  --  170 176 134*  INR 2.64* 1.38  --   --      Recent Labs  08/27/15 1149 08/29/15 0535 08/29/15 1505 08/30/15 0447  NA  --  142 141 142  K  --  4.0 3.8 3.7  CL  --  111 114* 115*  CO2  --  23 21* 22  GLUCOSE  --  136* 122* 126*  BUN  --  '14 13 12  '$ CREATININE  --  1.26* 1.04 0.81  CALCIUM  --  7.5* 7.3* 7.3*  MG  --   --   --  1.7  PHOS  --   --   --  2.6  PROT 5.3*  --   --  4.6*  ALBUMIN 2.2*  --   --  1.5*  AST 35  --   --  30  ALT 22  --   --  18  ALKPHOS 702*  --   --  387*  BILITOT 1.6*  --   --  1.3*  BILIDIR 0.9*  --   --   --   IBILI 0.7  --   --   --   TRIG  --   --   --  100   Estimated Creatinine Clearance: 102 mL/min (by C-G formula based on Cr of 0.81).    Recent Labs  08/30/15 0129 08/30/15 0411 08/30/15 0717  GLUCAP 119* 113* 111*   Insulin Requirements in the past 24 hours:  0 units SSI  Current Nutrition:  Clinimix E5/15 at 54m/hr + lipids at 156mr  Assessment: 5379om presented to the hospital with abdominal pain, nausea and vomiting. Found to have metastatic cancer (colon vs pancreatic) with massive cecal dilitation. Pt has had significant weight loss and  poor appetite over the past month. Palliative care is involved.   Surgeries/Procedures: 12/16 Exlap, partial colectomy, ileostomy and mucus fistula  GI: s/p exlap yesterday - ongoing weight loss and poor PO intake for several weeks. Prealbumin low at 7.6. FOB negative, NG output 150, LBM 12/17 - PPI Endo: No hx DM - has not required insulin Lytes: Lytes WNL Renal: SCr 0.81, UOP 0.5 - D5NS + KCl 2045mat 70m8m Pulm: 100% 3L [place] Cards: BP 103/58, HR 101 Hepatobil: Heparin for bilateral DVT - Alk phos elevated but decreased from yesterday, Tbili mildly elevated at 1.3 - possibly related to liver mets Neuro: Hx EtOH abuse - Dilaudid PCA - CPOT = 2 ID: Afebrile, WBC 11.5, no abx   Best Practices: SCDs  TPN Access: PICC placed 12/17 TPN start date: 12/17>>  Nutritional Goals:  Per RD 12/17: 2000-2200 kCal/day 95-110 grams of protein per day  Plan:  - Increase Clinimix E5/15 to 16m/hr + lipids 20% at 154mhr - provides 100gm protein (100% goal) and 1894 Kcal (95% goal) - MVI + TE daily in TPN - Reduce MIVF to KVUrbanat 1800 with new TPN - Continue SSI until TPN is at goal - F/u labs in AM  Pocahontas Cohenour, RaRande Lawman2/18/2016,7:29 AM

## 2015-08-30 NOTE — Progress Notes (Signed)
ANTICOAGULATION CONSULT NOTE - Follow Up Consult  Pharmacy Consult for heparin  Indication: : bilateral common femoral venous thrombosis   No Known Allergies  Patient Measurements: Height: 5\' 8"  (172.7 cm) Weight: 160 lb 8 oz (72.802 kg) IBW/kg (Calculated) : 68.4  Vital Signs: Temp: 97.6 F (36.4 C) (12/18 1335) Temp Source: Oral (12/18 1335) BP: 99/60 mmHg (12/18 1335) Pulse Rate: 84 (12/18 1335)  Labs:  Recent Labs  09/11/2015 0526 08/29/15 0535 08/29/15 1505 08/30/15 0447 08/30/15 0855 08/30/15 1450  HGB 8.5* 8.4*  --  7.1*  --  7.3*  HCT 26.9* 27.7*  --  22.4*  --  23.4*  PLT 170 176  --  134*  --  146*  LABPROT 17.1*  --   --   --   --   --   INR 1.38  --   --   --   --   --   HEPARINUNFRC  --  0.35 0.30 0.24*  --  0.23*  CREATININE  --  1.26* 1.04 0.81  --   --   TROPONINI  --   --   --   --  0.04*  --     Estimated Creatinine Clearance: 102 mL/min (by C-G formula based on Cr of 0.81).   Assessment: 54 yo male with bilateral DVT on heparin and also noted with metastatic cancer and s/p partial colectomy, Ileostomy. Plans noted for possible IVC filter. Heparin level below goal at 0.24 on 1800 units/hr. Hgb down to 7.1, plt down to 134. No overt bleeding noted. No issues with line per RN.  PM f/u > heparin level down to 0.23 despite earlier increase in heparin rate.  Per RN, no issues with IV infusion.    Goal of Therapy:  Heparin level 0.3-0.7 units/ml Monitor platelets by anticoagulation protocol: Yes   Plan:  -Increase heparin to 2100 units/hr -F/u 6 hr heparin level -Follow CBC closely and f/u s/s bleeding  Uvaldo Rising, BCPS  Clinical Pharmacist Pager [phone]  08/30/2015 4:22 PM

## 2015-08-31 ENCOUNTER — Encounter (HOSPITAL_COMMUNITY): Payer: Self-pay | Admitting: Surgery

## 2015-08-31 DIAGNOSIS — C189 Malignant neoplasm of colon, unspecified: Secondary | ICD-10-CM

## 2015-08-31 LAB — COMPREHENSIVE METABOLIC PANEL
ALBUMIN: 1.5 g/dL — AB (ref 3.5–5.0)
ALK PHOS: 425 U/L — AB (ref 38–126)
ALT: 15 U/L — AB (ref 17–63)
ANION GAP: 5 (ref 5–15)
AST: 26 U/L (ref 15–41)
BUN: 8 mg/dL (ref 6–20)
CALCIUM: 7.6 mg/dL — AB (ref 8.9–10.3)
CO2: 23 mmol/L (ref 22–32)
CREATININE: 0.7 mg/dL (ref 0.61–1.24)
Chloride: 111 mmol/L (ref 101–111)
GFR calc Af Amer: >60 mL/min (ref >60)
GFR calc non Af Amer: >60 mL/min (ref >60)
GLUCOSE: 123 mg/dL — AB (ref 65–99)
Potassium: 3.3 mmol/L — ABNORMAL LOW (ref 3.5–5.1)
SODIUM: 139 mmol/L (ref 135–145)
Total Bilirubin: 1.6 mg/dL — ABNORMAL HIGH (ref 0.3–1.2)
Total Protein: 4.8 g/dL — ABNORMAL LOW (ref 6.5–8.1)

## 2015-08-31 LAB — CBC
HEMATOCRIT: 22.7 % — AB (ref 39.0–52.0)
HEMOGLOBIN: 7 g/dL — AB (ref 13.0–17.0)
MCH: 27.3 pg (ref 26.0–34.0)
MCHC: 30.8 g/dL (ref 30.0–36.0)
MCV: 88.7 fL (ref 78.0–100.0)
Platelets: 132 10*3/uL — ABNORMAL LOW (ref 150–400)
RBC: 2.56 MIL/uL — AB (ref 4.22–5.81)
RDW: 17 % — ABNORMAL HIGH (ref 11.5–15.5)
WBC: 10.8 10*3/uL — ABNORMAL HIGH (ref 4.0–10.5)

## 2015-08-31 LAB — PHOSPHORUS: Phosphorus: 2.4 mg/dL — ABNORMAL LOW (ref 2.5–4.6)

## 2015-08-31 LAB — MAGNESIUM: Magnesium: 1.6 mg/dL — ABNORMAL LOW (ref 1.7–2.4)

## 2015-08-31 LAB — PREALBUMIN: PREALBUMIN: 2.6 mg/dL — AB (ref 18–38)

## 2015-08-31 LAB — DIFFERENTIAL
BASOS ABS: 0.1 10*3/uL (ref 0.0–0.1)
Basophils Relative: 1 %
EOS ABS: 0.4 10*3/uL (ref 0.0–0.7)
EOS PCT: 4 %
Lymphocytes Relative: 15 %
Lymphs Abs: 1.6 10*3/uL (ref 0.7–4.0)
MONO ABS: 0.5 10*3/uL (ref 0.1–1.0)
Monocytes Relative: 5 %
Neutro Abs: 8.2 10*3/uL — ABNORMAL HIGH (ref 1.7–7.7)
Neutrophils Relative %: 75 %
WBC MORPHOLOGY: INCREASED

## 2015-08-31 LAB — GLUCOSE, CAPILLARY
GLUCOSE-CAPILLARY: 111 mg/dL — AB (ref 65–99)
GLUCOSE-CAPILLARY: 108 mg/dL — AB (ref 65–99)
GLUCOSE-CAPILLARY: 119 mg/dL — AB (ref 65–99)
Glucose-Capillary: 113 mg/dL — ABNORMAL HIGH (ref 65–99)
Glucose-Capillary: 114 mg/dL — ABNORMAL HIGH (ref 65–99)

## 2015-08-31 LAB — TRIGLYCERIDES: TRIGLYCERIDES: 137 mg/dL (ref <150)

## 2015-08-31 LAB — HEPARIN LEVEL (UNFRACTIONATED): Heparin Unfractionated: 0.32 IU/mL (ref 0.30–0.70)

## 2015-08-31 LAB — HEMOGLOBIN AND HEMATOCRIT, BLOOD
HEMATOCRIT: 27.8 % — AB (ref 39.0–52.0)
HEMOGLOBIN: 8.6 g/dL — AB (ref 13.0–17.0)

## 2015-08-31 MED ORDER — METHOCARBAMOL 1000 MG/10ML IJ SOLN
1000.0000 mg | Freq: Three times a day (TID) | INTRAVENOUS | Status: DC | PRN
  Administered 2015-08-31 – 2015-09-03 (×2): 1000 mg via INTRAVENOUS
  Filled 2015-08-31 (×5): qty 10

## 2015-08-31 MED ORDER — FENTANYL 25 MCG/HR TD PT72
25.0000 ug | MEDICATED_PATCH | TRANSDERMAL | Status: DC
  Administered 2015-08-31: 25 ug via TRANSDERMAL
  Filled 2015-08-31: qty 1

## 2015-08-31 MED ORDER — FAT EMULSION 20 % IV EMUL
240.0000 mL | INTRAVENOUS | Status: AC
  Administered 2015-08-31: 240 mL via INTRAVENOUS
  Filled 2015-08-31: qty 250

## 2015-08-31 MED ORDER — MAGNESIUM SULFATE 2 GM/50ML IV SOLN
2.0000 g | Freq: Once | INTRAVENOUS | Status: AC
  Administered 2015-08-31: 2 g via INTRAVENOUS
  Filled 2015-08-31 (×2): qty 50

## 2015-08-31 MED ORDER — POTASSIUM PHOSPHATES 15 MMOLE/5ML IV SOLN
20.0000 mmol | Freq: Once | INTRAVENOUS | Status: AC
  Administered 2015-08-31: 20 mmol via INTRAVENOUS
  Filled 2015-08-31: qty 6.67

## 2015-08-31 MED ORDER — HYDROMORPHONE HCL 1 MG/ML IJ SOLN
1.0000 mg | Freq: Once | INTRAMUSCULAR | Status: AC
  Administered 2015-08-31: 1 mg via INTRAVENOUS
  Filled 2015-08-31: qty 1

## 2015-08-31 MED ORDER — TRACE MINERALS CR-CU-MN-SE-ZN 10-1000-500-60 MCG/ML IV SOLN
INTRAVENOUS | Status: AC
  Administered 2015-08-31: 18:00:00 via INTRAVENOUS
  Filled 2015-08-31: qty 1992

## 2015-08-31 NOTE — Consult Note (Signed)
WOC ostomy consult note Stoma type/location: RLQ, ileostomy Output: liquid, green. Used opportunity to teach patient about emptying the pouch and the fill of the pouch being 1/2 full and with flatus Ostomy pouching: 2pc. 2 3/4" in place, will downsize to 2 1/4 with next pouch change  Education provided: left educational materials in the patient's room He is very flat and does not really say much unless he answers questions.  He lives alone, he reports sister from [place] is coming the week after Christmas to stay with him.  Poor prognosis noted in chart.  I explained creation of stoma and MF, and I demonstrated how to open and close the pouch and empty.  He is very groggy while I'm teaching him.   Enrolled patient in [place] program: No  Will order supplies and follow up with him tomorrow for possible pouch change.  WOC will follow along with you for continued support with ostomy teaching and care [facility] RN,CWOCN [id number]

## 2015-08-31 NOTE — Progress Notes (Signed)
Chaplain presented to the patient to provide pastoral care support and to complete a Spiritual Care Consult. The patient was awake at the time of this visit, but seemed somewhat reserved and quiet. He reports he is of the [employer] faith and wanted to show his bible to the [place], but the patient was informed that whatever faith system he drew his strength and hope from was good for him to believe in in his sickness.  Chaplain encouraged him to continue to do so, he extended  his hand for the Chaplain to shake as the visit ended. Chaplain Yaakov Guthrie [phone]

## 2015-08-31 NOTE — Progress Notes (Signed)
Family Medicine Teaching Service Daily Progress Note Intern Pager: [phone]  Patient name: Jesse Murray Medical record number: [id number] Date of birth: Jan 10, 1961 Age: 54 y.o. Gender: male  Primary Care Provider: No PCP Per Patient Consultants: surgery Code Status: FULL  Patient overview and Major Events to Date: 12/14 CT showing abdominal mass and SBO 12/16 ex-lap with extensive abdominal mets, suspected pancreatic primary non-resectable, ileostomy  Assessment and Plan: Jesse Murray is a 54 y.o. male presenting with abdominal pain, as well as bilateral leg swelling and pain. PMH is significant for EtOH abuse.   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 Ex-lap 12/16 found primary to be most likely pancreatic that is invading hepatic flexure; unresectable but ileostomy was done to help alleviate symptoms. NG tube still in place but clamped. Patient reporting improvement in overall pain but still some persistent abdominal pain. - Fentanyl patch and Dilaudid PCA for pain control - Per surgery, continue robaxin - Palliative care continuing to follow pt - appreciate recs  - Nutrition consult for malnourishment - patient now on TPN - PT/OT consulted - Oncology consulted, appreciate recommendations - Wound care following  - Surgical pathology pending  #Leg pain/swelling likely 2/2 bilateral DVTs: Most likely DVT due to hypercoagulable state from malignancy and/or recent inactivity after lying in bed for three weeks. Well's criteria for DVT 7 (high risk for DVT). Well's criteria for PE 5.5 (moderate risk). Physical exam also consistent with DVT. Patient denies difficulty breathing. Both legs still markedly edematous, but patient reports pain is improved today.  - Consider CTA chest if difficulty breathing to rule out  PE - Receiving heparin gtt for presumed DVT - Depending on clot burden patient may be a candidate for IVC filter - F/u venous doppler US - Consult vascular surgery  #Anemia: Hgb 9.7 on admission (12.3 one month ago). No signs of bleeding, so potentially just hemodilution as all cell lines have decreased. Also potentially related to malignancy. FOBT negative. Vitals are stable. PT elevated at 32, APTT elevated at 81. INR improved to 1.3. Hgb 7.2 this AM (12/20). Denies dizziness, lightheadedness, worsening weakness.  - Daily H&H - Transfusion threshold <7  #AKI: Unsure of baseline Cr but one month ago was 0.69. On admission 1.31. Patient did receive IV contrast in ED for CT scan. Also with decreased PO intake likely a prerenal etiology as well. BUN/Cr ratio >20. Cr improved to 0.64 (12/20).  - Avoid nephrotoxic agents including contrast - IV hydration  - I/Os  FEN/GI: TPN, NPO, Protonix IV, D5NS + KCl @10mL /hr Prophylaxis: SCDs, heparin gtt  Subjective:  Patient reports improved pain control this AM. He does report some abdominal pain, but denies nausea or vomiting. Reports continued but improved pain in his legs. Would like to speak with palliative care again today.   Objective: Temp:  [97.5 F (36.4 C)-98.5 F (36.9 C)] 97.5 F (36.4 C) (12/20 0449) Pulse Rate:  [97-106] 97 (12/20 0449) Resp:  [14-23] 16 (12/20 0800) BP: (112-130)/(65-74) 112/71 mmHg (12/20 0449) SpO2:  [95 %-100 %] 99 % (12/20 0800) Physical Exam: General: lying in bed in NAD, NG tube in place but clamped  Cardiovascular: RRR, no murmurs appreciated Respiratory: CTAB, no wheezing, no increased WOB Abdomen: clean dressing over midline incision, right sided ileostomy with good output Extremities: 1+ pitting edema to knee on RLE with 3+ pitting edema  of foot to ankle; 3+ edema of L foot to ankle, 1+ pitting edema to knee of LLE, both LE tender to palpation  Laboratory:  Recent Labs Lab 08/30/15 1450  08/31/15 0441 08/31/15 1606 09/01/15 0538  WBC 12.4* 10.8*  --  12.7*  HGB 7.3* 7.0* 8.6* 7.2*  HCT 23.4* 22.7* 27.8* 22.7*  PLT 146* 132*  --  121*    Recent Labs Lab 08/27/15 1149  08/30/15 0447 08/31/15 0441 09/01/15 0538  NA  --   < > 142 139 134*  K  --   < > 3.7 3.3* 3.6  CL  --   < > 115* 111 106  CO2  --   < > 22 23 22   BUN  --   < > 12 8 9   CREATININE  --   < > 0.81 0.70 0.64  CALCIUM  --   < > 7.3* 7.6* 7.8*  PROT 5.3*  --  4.6* 4.8*  --   BILITOT 1.6*  --  1.3* 1.6*  --   ALKPHOS 702*  --  387* 425*  --   ALT 22  --  18 15*  --   AST 35  --  30 26  --   GLUCOSE  --   < > 126* 123* 120*  < > = values in this interval not displayed.  Imaging/Diagnostic Tests: None new   Verner Mould, MD 09/01/2015, 9:26 AM PGY-1, [place] Intern pager: [phone], text pages welcome

## 2015-08-31 NOTE — Progress Notes (Signed)
Family Medicine Teaching Service Daily Progress Note Intern Pager: [phone]  Patient name: Jesse Murray Medical record number: [id number] Date of birth: 07-24-1961 Age: 54 y.o. Gender: male  Primary Care Provider: No PCP Per Patient Consultants: surgery Code Status: FULL  Patient overview and Major Events to Date: 12/14 CT showing abdominal mass and SBO 12/16 ex-lap with extensive abdominal mets, suspected pancreatic primary non-resectable, ileostomy  Assessment and Plan: Jesse Murray is a 54 y.o. male presenting with abdominal pain, as well as bilateral leg swelling and pain. PMH is significant for EtOH abuse.   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 Ex-lap 12/16 found primary to be most likely pancreatic that is invading hepatic flexure; unresectable but ileostomy was done to help alleviate symptoms. Patient reporting improvement in abdominal pain but still significant generalized pain, especially in R leg. - NG tube placed for decompression >> continued output (200 mL yesterday) , clamped for now as of this AM  - Fentanyl patch and Dilaudid PCA for pain control - Palliative care continuing to follow pt - appreciate recs  - Nutrition consult for malnourishment - patient now on TPN - PT/OT consult when appropriate for deconditioning - Oncology consulted, to see on Monday, appreciate recommendations - Surgical pathology pending  #Leg pain/swelling likely 2/2 bilateral DVTs: Most likely DVT due to hypercoagulable state from malignancy and/or recent inactivity after lying in bed for three weeks. Well's criteria for DVT 7 (high risk for DVT). Well's criteria for PE 5.5 (moderate risk). Physical exam also consistent with DVT. Patient denies difficulty breathing. Both legs still markedly edematous, and patient  reporting persistent severe pain more prominent in R than L leg.  - Consider CTA chest if difficulty breathing to rule out PE - Receiving heparin gtt for presumed DVT - Depending on clot burden patient may be a candidate for IVC filter - Consult vascular surgery today given patient's persistent edema and pain despite heparin   #Anemia: Hgb 9.7 on admission (12.3 one month ago). No signs of bleeding, so potentially just hemodilution as all cell lines have decreased. Also potentially related to malignancy. FOBT negative. Vitals are stable. PT elevated at 32, APTT elevated at 81. INR improved to 1.3. Hgb decreased to 7.0 this AM (12/19). Denies dizziness, lightheadedness, worsening weakness.  - F/u repeat H&H, then transfuse 1U PRBC if Hgb still <7 - Transfusion threshold <7  #AKI: Unsure of baseline Cr but one month ago was 0.69. On admission 1.31. Patient did receive IV contrast in ED for CT scan. Also with decreased PO intake likely a prerenal etiology as well. BUN/Cr ratio >20. Cr improved to 0.70 (12/19).  - Avoid nephrotoxic agents including contrast - IV hydration  - I/Os  FEN/GI: TPN, NPO, Protonix IV, D5NS + KCl @10mL /hr Prophylaxis: SCDs, heparin gtt  Subjective:  Patient reporting significant pain this morning, with pain greatest in R leg. Report some relief in abdominal pain after procedure on Friday and with NG tube in place. Patient with Hgb 7 this AM, however denies dizziness or lightheadedness. Does report weakness but attributes this to recovering from surgery.   Objective: Temp:  [97.6 F (36.4 C)-98.7 F (37.1 C)] 98.4 F (36.9 C) (12/19 0433) Pulse Rate:  [84-103] 103 (12/19 0433) Resp:  [16-27] 16 (12/19 1200) BP: (99-128)/(60-71) 109/71 mmHg (12/19 0433) SpO2:  [94 %-99 %] 95 % (12/19 1200)  Physical Exam: General: lying in bed in NAD, NG tube in place with good output Cardiovascular: RRR, no murmurs appreciated Respiratory: CTAB, no wheezing, no increased  WOB Abdomen: clean dressing over midline incision, right sided ileostomy with good output Extremities: 1+ pitting edema to knee on RLE with 3+ pitting edema of foot to ankle; 3+ edema of L foot to ankle, 1+ pitting edema to knee of LLE, both LE tender to palpation  Laboratory:  Recent Labs Lab 08/30/15 0447 08/30/15 1450 08/31/15 0441  WBC 11.5* 12.4* 10.8*  HGB 7.1* 7.3* 7.0*  HCT 22.4* 23.4* 22.7*  PLT 134* 146* 132*    Recent Labs Lab 08/27/15 1149  08/29/15 1505 08/30/15 0447 08/31/15 0441  NA  --   < > 141 142 139  K  --   < > 3.8 3.7 3.3*  CL  --   < > 114* 115* 111  CO2  --   < > 21* 22 23  BUN  --   < > 13 12 8   CREATININE  --   < > 1.04 0.81 0.70  CALCIUM  --   < > 7.3* 7.3* 7.6*  PROT 5.3*  --   --  4.6* 4.8*  BILITOT 1.6*  --   --  1.3* 1.6*  ALKPHOS 702*  --   --  387* 425*  ALT 22  --   --  18 15*  AST 35  --   --  30 26  GLUCOSE  --   < > 122* 126* 123*  < > = values in this interval not displayed.  Imaging/Diagnostic Tests: None new  Verner Mould, MD 08/31/2015, 1:23 PM PGY-1, [place] Intern pager: [phone], text pages welcome

## 2015-08-31 NOTE — Progress Notes (Signed)
[place] NOTE   Pharmacy Consult for TPN Indication: SBO, malnutrition in the setting of metastatic ca  No Known Allergies  Patient Measurements: Height: '5\' 8"'$  (172.7 cm) Weight: 160 lb 8 oz (72.802 kg) IBW/kg (Calculated) : 68.4  Vital Signs: Temp: 98.4 F (36.9 C) (12/19 0433) Temp Source: Oral (12/19 0433) BP: 109/71 mmHg (12/19 0433) Pulse Rate: 103 (12/19 0433) Intake/Output from previous day: 12/18 0701 - 12/19 0700 In: 1897.2 [P.O.:300; I.V.:598.1; TPN:999.1] Out: 1800 [Urine:1500; Emesis/NG output:200; Stool:100] Intake/Output from this shift: Total I/O In: 82.2 [I.V.:82.2] Out: -   Labs:  Recent Labs  08/30/15 0447 08/30/15 1450 08/31/15 0441  WBC 11.5* 12.4* 10.8*  HGB 7.1* 7.3* 7.0*  HCT 22.4* 23.4* 22.7*  PLT 134* 146* 132*     Recent Labs  08/29/15 1505 08/30/15 0447 08/31/15 0441  NA 141 142 139  K 3.8 3.7 3.3*  CL 114* 115* 111  CO2 21* 22 23  GLUCOSE 122* 126* 123*  BUN '13 12 8  '$ CREATININE 1.04 0.81 0.70  CALCIUM 7.3* 7.3* 7.6*  MG  --  1.7 1.6*  PHOS  --  2.6 2.4*  PROT  --  4.6* 4.8*  ALBUMIN  --  1.5* 1.5*  AST  --  30 26  ALT  --  18 15*  ALKPHOS  --  387* 425*  BILITOT  --  1.3* 1.6*  PREALBUMIN  --   --  2.6*  TRIG  --  100 137   Estimated Creatinine Clearance: 103.3 mL/min (by C-G formula based on Cr of 0.7).    Recent Labs  08/30/15 2355 08/31/15 0406 08/31/15 0733  GLUCAP 104* 111* 113*   Insulin Requirements in the past 24 hours:  0 units SSI  Current Nutrition:  Clinimix E 5/15 @ 23m/hr + lipids 20% at 156mhr - provides 100g protein (100% goal) and 1894 Kcal (95% goal)  Nutritional Goals:  Per RD 12/17: 2000-2200 kCal/day 95-110 grams of protein per day  Assessment: 6322om presented to the hospital with abdominal pain, nausea and vomiting. Found to have metastatic cancer (colon vs pancreatic) with massive cecal dilitation. Pt has had significant weight loss and poor appetite over  the past month. Palliative care is involved.   Surgeries/Procedures: 12/16 Exlap, partial colectomy, ileostomy and mucus fistula  GI: s/p exlap- ongoing weight loss and poor PO intake for several weeks. Prealbumin dropped further to 2.6. FOB negative, NG output 250/24h, LBM 12/18 - PPI  Endo: No hx DM - has not required insulin  Lytes: K 3.3, phos 2.4, mag 1.6, CorCa ~9.6  Renal: SCr 0.81, UOP 0.5 - D5NS + KCl 2070mat 10m60m  Pulm: 100% 3L [place]  Cards: BP 103/58, HR 101  Hepatobil: Alk phos elevated further at 425, Tbili increased to 1.6 - possibly related to liver mets. ALT/AST ok, albumin low. Trigs remain normal at 137  Neuro: Hx EtOH abuse - Dilaudid PCA - pain scores 0-8  ID: Afebrile, WBC 10.8, no abx    AC/Heme: Heparin for bilateral DVT - level therapeutic x2. Hgb 7, plts 132- no overt bleeding noted  Best Practices: SCDs/IV heparin  TPN Access: PICC placed 12/17 TPN start date: 12/17>>  Plan:  - give potassium phosphate 20mm20mV x1 (will provide 30mEq51mpotassium) - give magnesium sulfate 2g IV x1 - Continue Clinimix E5/15 at 83ml/h33mlipids 20% at 10ml/hr61mrovides 100g protein (100% goal) and 1894 Kcal (95% goal) - MVI + TE daily in TPN -  Continue D5NS with 20KCl at [facility] - Continue SSI until TPN- if he still does not require any, will discontinue tomorrow - BMET, mag and phos in AM  -continue heparin at 2200 units/hr -daily HL and CBC  Jun Rightmyer D. Phoebie Shad, PharmD, BCPS Clinical Pharmacist Pager: [phone] 08/31/2015 8:03 AM

## 2015-08-31 NOTE — Progress Notes (Signed)
[facility] Progress Note  3 Days Post-Op  Subjective: Pt says he's doing okay.  He wants to know the results of the pathology but they aren't back yet.  No N/V.  Just using swabs/ice.  NG with 241mL output.  He's had 168mL stool overnight and his bag is full currently.  His friend came to visit him.  Hasn't been out of bed yet or to the chair   Objective: Vital signs in last 24 hours: Temp:  [97.6 F (36.4 C)-98.7 F (37.1 C)] 98.4 F (36.9 C) (12/19 0433) Pulse Rate:  [84-103] 103 (12/19 0433) Resp:  [14-27] 16 (12/19 0748) BP: (99-128)/(60-71) 109/71 mmHg (12/19 0433) SpO2:  [94 %-99 %] 95 % (12/19 0754) Last BM Date: 08/30/15  Intake/Output from previous day: 12/18 0701 - 12/19 0700 In: 1897.2 [P.O.:300; I.V.:598.1; TPN:999.1] Out: 1600 [Urine:1300; Emesis/NG output:200; Stool:100] Intake/Output this shift: Total I/O In: 82.2 [I.V.:82.2] Out: 200 [Urine:200]  PE: Gen:  Alert, NAD, pleasant Card:  RRR, no M/G/R heard Pulm:  CTA, no W/R/R, IS to 1000 Abd: Soft, distended, mild tenderness, +BS, incisions C/D/I with staples in place, mucous fistula and end ileostomy side by side with one bag covering both, pink with green thin output (bag is full of flatus too).   Lab Results:   Recent Labs  08/30/15 1450 08/31/15 0441  WBC 12.4* 10.8*  HGB 7.3* 7.0*  HCT 23.4* 22.7*  PLT 146* 132*   BMET  Recent Labs  08/30/15 0447 08/31/15 0441  NA 142 139  K 3.7 3.3*  CL 115* 111  CO2 22 23  GLUCOSE 126* 123*  BUN 12 8  CREATININE 0.81 0.70  CALCIUM 7.3* 7.6*   PT/INR No results for input(s): LABPROT, INR in the last 72 hours. CMP     Component Value Date/Time   NA 139 08/31/2015 0441   K 3.3* 08/31/2015 0441   CL 111 08/31/2015 0441   CO2 23 08/31/2015 0441   GLUCOSE 123* 08/31/2015 0441   BUN 8 08/31/2015 0441   CREATININE 0.70 08/31/2015 0441   CREATININE 0.69* 07/14/2015 1732   CALCIUM 7.6* 08/31/2015 0441   PROT 4.8* 08/31/2015 0441    ALBUMIN 1.5* 08/31/2015 0441   AST 26 08/31/2015 0441   ALT 15* 08/31/2015 0441   ALKPHOS 425* 08/31/2015 0441   BILITOT 1.6* 08/31/2015 0441   GFRNONAA >60 08/31/2015 0441   GFRAA >60 08/31/2015 0441   Lipase     Component Value Date/Time   LIPASE 31 08/21/2015 0940       Studies/Results: Dg Chest Port 1 View  08/29/2015  CLINICAL DATA:  Encounter for PICC line placement EXAM: PORTABLE CHEST 1 VIEW COMPARISON:  09/12/2015 FINDINGS: Nasogastric tube is in the stomach. There is a right arm PICC line with tip in the cavoatrial junction. Normal heart size. Atelectasis is identified in the left base. IMPRESSION: Right arm PICC line tip is in the cavoatrial junction. Left base atelectasis. Electronically Signed   By: Kerby Moors M.D.   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 POD #3 s/p Ex Lap, partial colectomy, end ileostomy & mucous fistula (side by side) -NPO, clamp NG tube today, IVF, pain control, antiemetics -PICC and TPN, await return of bowel function prior to advancing diet -D/c foley if okay with medicine -Once daily dressing change to closed incision -Pending surgical path -Inpt vs outpt Oncology referral -Add higher dose robaxin -WOC nursing consult for ostomy/mucous fistula teaching -Ordered PT  Liver, left adrenal and mesenteric nodal metastasis by CT Bilateral common femoral venous thrombosis by CT Small intermediate lung nodules Hyponatremia Acute renal insuffiencey   Elevated LFT's related to liver metastasis Weight loss 30-40 lbs over 1.5 months PCM & deconditioning - prealbumin 7.6 on TPN, ordered PT DVT proph - Heparin drip  A work friend was at bedside and with the permission of the patient I gave him an update regarding his current condition and recovery process.  His family is in [place] and his parents are currently battling cancer and his sister is having to take care of them which is why they aren't able to be here.    LOS: 5 days    Jesse Murray 08/31/2015, 10:55 AM Pager: [phone]

## 2015-09-01 ENCOUNTER — Other Ambulatory Visit: Payer: Self-pay | Admitting: Hematology

## 2015-09-01 ENCOUNTER — Encounter (HOSPITAL_COMMUNITY): Payer: Self-pay

## 2015-09-01 DIAGNOSIS — G893 Neoplasm related pain (acute) (chronic): Secondary | ICD-10-CM

## 2015-09-01 DIAGNOSIS — C189 Malignant neoplasm of colon, unspecified: Secondary | ICD-10-CM | POA: Insufficient documentation

## 2015-09-01 DIAGNOSIS — R161 Splenomegaly, not elsewhere classified: Secondary | ICD-10-CM

## 2015-09-01 DIAGNOSIS — C779 Secondary and unspecified malignant neoplasm of lymph node, unspecified: Secondary | ICD-10-CM

## 2015-09-01 DIAGNOSIS — C762 Malignant neoplasm of abdomen: Secondary | ICD-10-CM

## 2015-09-01 DIAGNOSIS — N179 Acute kidney failure, unspecified: Secondary | ICD-10-CM

## 2015-09-01 DIAGNOSIS — I82403 Acute embolism and thrombosis of unspecified deep veins of lower extremity, bilateral: Secondary | ICD-10-CM

## 2015-09-01 DIAGNOSIS — R1030 Lower abdominal pain, unspecified: Secondary | ICD-10-CM

## 2015-09-01 DIAGNOSIS — C787 Secondary malignant neoplasm of liver and intrahepatic bile duct: Principal | ICD-10-CM

## 2015-09-01 DIAGNOSIS — C7972 Secondary malignant neoplasm of left adrenal gland: Secondary | ICD-10-CM

## 2015-09-01 DIAGNOSIS — R7989 Other specified abnormal findings of blood chemistry: Secondary | ICD-10-CM

## 2015-09-01 DIAGNOSIS — R918 Other nonspecific abnormal finding of lung field: Secondary | ICD-10-CM

## 2015-09-01 LAB — BASIC METABOLIC PANEL
ANION GAP: 6 (ref 5–15)
BUN: 9 mg/dL (ref 6–20)
CO2: 22 mmol/L (ref 22–32)
Calcium: 7.8 mg/dL — ABNORMAL LOW (ref 8.9–10.3)
Chloride: 106 mmol/L (ref 101–111)
Creatinine, Ser: 0.64 mg/dL (ref 0.61–1.24)
GLUCOSE: 120 mg/dL — AB (ref 65–99)
POTASSIUM: 3.6 mmol/L (ref 3.5–5.1)
SODIUM: 134 mmol/L — AB (ref 135–145)

## 2015-09-01 LAB — PREPARE RBC (CROSSMATCH)

## 2015-09-01 LAB — CBC
HCT: 22.7 % — ABNORMAL LOW (ref 39.0–52.0)
HEMOGLOBIN: 7.2 g/dL — AB (ref 13.0–17.0)
MCH: 27.9 pg (ref 26.0–34.0)
MCHC: 31.7 g/dL (ref 30.0–36.0)
MCV: 88 fL (ref 78.0–100.0)
Platelets: 121 10*3/uL — ABNORMAL LOW (ref 150–400)
RBC: 2.58 MIL/uL — AB (ref 4.22–5.81)
RDW: 17.1 % — ABNORMAL HIGH (ref 11.5–15.5)
WBC: 12.7 10*3/uL — AB (ref 4.0–10.5)

## 2015-09-01 LAB — PHOSPHORUS: PHOSPHORUS: 3.6 mg/dL (ref 2.5–4.6)

## 2015-09-01 LAB — GLUCOSE, CAPILLARY
GLUCOSE-CAPILLARY: 110 mg/dL — AB (ref 65–99)
GLUCOSE-CAPILLARY: 95 mg/dL (ref 65–99)
Glucose-Capillary: 116 mg/dL — ABNORMAL HIGH (ref 65–99)
Glucose-Capillary: 109 mg/dL — ABNORMAL HIGH (ref 65–99)
Glucose-Capillary: 115 mg/dL — ABNORMAL HIGH (ref 65–99)
Glucose-Capillary: 110 mg/dL — ABNORMAL HIGH (ref 65–99)

## 2015-09-01 LAB — HEPARIN LEVEL (UNFRACTIONATED): HEPARIN UNFRACTIONATED: 0.34 [IU]/mL (ref 0.30–0.70)

## 2015-09-01 LAB — MAGNESIUM: MAGNESIUM: 1.7 mg/dL (ref 1.7–2.4)

## 2015-09-01 MED ORDER — FAT EMULSION 20 % IV EMUL
240.0000 mL | INTRAVENOUS | Status: AC
  Administered 2015-09-01: 240 mL via INTRAVENOUS
  Filled 2015-09-01: qty 250

## 2015-09-01 MED ORDER — ENSURE ENLIVE PO LIQD
237.0000 mL | Freq: Three times a day (TID) | ORAL | Status: DC
  Administered 2015-09-01 (×2): 237 mL via ORAL

## 2015-09-01 MED ORDER — SODIUM CHLORIDE 0.9 % IV SOLN: Freq: Once | INTRAVENOUS | Status: DC

## 2015-09-01 MED ORDER — TRACE MINERALS CR-CU-MN-SE-ZN 10-1000-500-60 MCG/ML IV SOLN
INTRAVENOUS | Status: DC
  Administered 2015-09-01: 17:00:00 via INTRAVENOUS
  Filled 2015-09-01: qty 1992

## 2015-09-01 MED ORDER — OXYCODONE HCL 5 MG PO TABS
5.0000 mg | ORAL_TABLET | ORAL | Status: DC | PRN
  Administered 2015-09-02: 10 mg via ORAL
  Filled 2015-09-01: qty 2

## 2015-09-01 NOTE — Evaluation (Signed)
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 Pt with generalized weakness, bil LE edema and decreased mobility. Pt will benefit from acute therapy to maximize mobility, function, gait and activity to decrease burden of care and maximize quality of life. Encouraged daily mobility with nursing and continue HEP.     Follow Up Recommendations SNF    Equipment Recommendations  Rolling walker with 5" wheels    Recommendations for Other Services       Precautions / Restrictions Precautions Precautions: Fall Precaution Comments: abdominal wound and ostomy Restrictions Weight Bearing Restrictions: No      Mobility  Bed Mobility Overal bed mobility: Needs Assistance Bed Mobility: Supine to Sit     Supine to sit: Min assist;HOB elevated     General bed mobility comments: cues for sequence with assist to fully elevate trunk , HOB 20 degrees  Transfers Overall transfer level: Needs assistance   Transfers: Sit to/from Stand Sit to Stand: Min guard         General transfer comment: cues for hand placement and safety  Ambulation/Gait Ambulation/Gait assistance: Min guard Ambulation Distance (Feet): 25 Feet Assistive device: Rolling walker (2 wheeled) Gait Pattern/deviations: Step-to pattern;Shuffle;Trunk flexed;Narrow base of support   Gait velocity interpretation: Below normal speed for age/gender General Gait Details: pt with slow cautious gait, cues for posture, looking up and increased stride  Stairs            Wheelchair Mobility    Modified Rankin (Stroke Patients Only)       Balance Overall balance assessment: Needs assistance   Sitting balance-Leahy  Scale: Good       Standing balance-Leahy Scale: Fair                               Pertinent Vitals/Pain Pain Assessment: 0-10 Pain Score: 8  Pain Location: abdominal pain Pain Descriptors / Indicators: Sore Pain Intervention(s): Premedicated before session;Repositioned;PCA encouraged;Limited activity within patient's tolerance    Home Living Family/patient expects to be discharged to:: Private residence Living Arrangements: Alone   Type of Home: House Home Access: Level entry     Home Layout: One level Home Equipment: None      Prior Function Level of Independence: Independent               Hand Dominance        Extremity/Trunk Assessment   Upper Extremity Assessment: Generalized weakness           Lower Extremity Assessment: RLE deficits/detail;LLE deficits/detail RLE Deficits / Details: hip flexion 3/5, knee extension 4/5, knee flexion 3/5 LLE Deficits / Details: hip flexion 3/5, knee extension 4/5, knee flexion 3/5  Cervical / Trunk Assessment: Normal  Communication   Communication: No difficulties  Cognition Arousal/Alertness: Awake/alert Behavior During Therapy: Flat affect Overall Cognitive Status: Within Functional Limits for tasks assessed                      General Comments      Exercises General Exercises - Lower Extremity Long Arc Quad: AROM;Seated;Both;5 reps Heel Slides: AROM;Seated;Both;5 reps Hip Flexion/Marching: AROM;Seated;Both;5 reps      Assessment/Plan    PT  Assessment Patient needs continued PT services  PT Diagnosis Difficulty walking;Generalized weakness;Acute pain   PT Problem List Decreased strength;Decreased activity tolerance;Decreased balance;Pain;Decreased knowledge of use of DME;Decreased mobility  PT Treatment Interventions Gait training;DME instruction;Functional mobility training;Therapeutic activities;Therapeutic exercise;Patient/family education   PT Goals (Current goals can be  found in the Care Plan section) Acute Rehab PT Goals Patient Stated Goal: pt would not state, agreeable to increasing mobility PT Goal Formulation: With patient Time For Goal Achievement: 09/15/15 Potential to Achieve Goals: Fair    Frequency Min 3X/week   Barriers to discharge Decreased caregiver support      Co-evaluation               End of Session   Activity Tolerance: Patient tolerated treatment well Patient left: in chair;with call bell/phone within reach;with nursing/sitter in room Nurse Communication: Mobility status         Time: [id number] PT Time Calculation (min) (ACUTE ONLY): 30 min   Charges:   PT Evaluation $Initial PT Evaluation Tier I: 1 Procedure PT Treatments $Gait Training: 8-22 mins   PT G CodesMelford Aase 09/01/2015, 10:10 AM Elwyn Reach, [place]

## 2015-09-01 NOTE — Progress Notes (Signed)
Daily Progress Note   Patient Name: Jesse Murray       Date: 09/01/2015 DOB: [birth year]  Age: 54 y.o. MRN#: [id number] Attending Physician: Kinnie Feil, MD Primary Care Physician: No PCP Per Patient Admit Date: 08/24/2015  Reason for Consultation/Follow-up: Establishing goals of care, Non pain symptom management, Pain control and Psychosocial/spiritual support  Subjective: -continued discussion with Jesse Murray regarding his current medical situation.  Although we discussed his overall poor prognosis regardless of the outcomes of the biopsy he cannot make any decision regarding advanced directive decisions at this time.  Once he has more input from oncology he will make some decisions.  He is hopeful that once his sister/Jesse Murray gets to [facility] ( she is coming on Monday the 26th), she will be a great support and will help him make "the important decisions".  I spoke at great length with Jesse Murray by telephone, she will be her on Monday and plans to stay as long as she needs too.   Length of Stay: 6 days  Current Medications: Scheduled Meds:  . fentaNYL  25 mcg Transdermal Q72H  . HYDROmorphone   Intravenous 6 times per day  . pantoprazole (PROTONIX) IV  40 mg Intravenous QHS  . sodium chloride  10-40 mL Intracatheter Q12H    Continuous Infusions: . dextrose 5 % and 0.9 % NaCl with KCl 20 mEq/L 10 mL/hr at 08/30/15 1210  . Marland KitchenTPN (CLINIMIX-E) Adult 83 mL/hr at 08/31/15 1754   And  . fat emulsion 240 mL (08/31/15 1754)  . Marland KitchenTPN (CLINIMIX-E) Adult     And  . fat emulsion    . heparin 2,200 Units/hr (09/01/15 0340)    PRN Meds: diphenhydrAMINE **OR** diphenhydrAMINE, LORazepam, methocarbamol (ROBAXIN)  IV, naloxone **AND** sodium chloride, ondansetron (ZOFRAN) IV, phenol, sodium  chloride  Physical Exam: Physical Exam  Constitutional: He appears cachectic. He appears ill.  HENT:  Mouth/Throat: Mucous membranes are normal. No oropharyngeal exudate.  Cardiovascular: Normal rate, regular rhythm and normal heart sounds.   Pulmonary/Chest: He has decreased breath sounds in the right lower field and the left lower field.  Abdominal: There is generalized tenderness.  - noted iliostomy  Skin:  Warm and dry                Vital Signs: BP 112/71 mmHg  Pulse  97  Temp(Src) 97.5 F (36.4 C) (Oral)  Resp 16  Ht 5\' 8"  (1.727 m)  Wt 72.802 kg (160 lb 8 oz)  BMI 24.41 kg/m2  SpO2 99% SpO2: SpO2: 99 % O2 Device: O2 Device: Nasal Cannula O2 Flow Rate: O2 Flow Rate (L/min): 2 L/min  Intake/output summary:  Intake/Output Summary (Last 24 hours) at 09/01/15 [id number] Last data filed at 09/01/15 0800  Gross per 24 hour  Intake 1759.03 ml  Output   2245 ml  Net -485.97 ml   LBM: Last BM Date: 09/01/15 Baseline Weight: Weight: 68.04 kg (150 lb) Most recent weight: Weight: 72.802 kg (160 lb 8 oz)       Palliative Assessment/Data: Flowsheet Rows        Most Recent Value   Intake Tab    Referral Department  -- [Family Medicine]   Unit at Time of Referral  Med/Surg Unit   Palliative Care Primary Diagnosis  Cancer   Date Notified  09/10/2015   Palliative Care Type  New Palliative care   Reason for referral  Clarify Goals of Care   Date of Admission  08/22/2015   Date first seen by Palliative Care  09/07/2015   # of days Palliative referral response time  0 Day(s)   # of days IP prior to Palliative referral  0   Clinical Assessment    Psychosocial & Spiritual Assessment    Palliative Care Outcomes       Additional Data Reviewed: CBC    Component Value Date/Time   WBC 12.7* 09/01/2015 0538   WBC 11.1* 07/14/2015 1745   RBC 2.58* 09/01/2015 0538   RBC 4.22* 07/14/2015 1745   HGB 7.2* 09/01/2015 0538   HGB 12.3* 07/14/2015 1745   HCT 22.7* 09/01/2015 0538   HCT  36.5* 07/14/2015 1745   PLT 121* 09/01/2015 0538   MCV 88.0 09/01/2015 0538   MCV 86.3 07/14/2015 1745   MCH 27.9 09/01/2015 0538   MCH 29.2 07/14/2015 1745   MCHC 31.7 09/01/2015 0538   MCHC 33.8 07/14/2015 1745   RDW 17.1* 09/01/2015 0538   LYMPHSABS 1.6 08/31/2015 0441   MONOABS 0.5 08/31/2015 0441   EOSABS 0.4 08/31/2015 0441   BASOSABS 0.1 08/31/2015 0441    CMP     Component Value Date/Time   NA 134* 09/01/2015 0538   K 3.6 09/01/2015 0538   CL 106 09/01/2015 0538   CO2 22 09/01/2015 0538   GLUCOSE 120* 09/01/2015 0538   BUN 9 09/01/2015 0538   CREATININE 0.64 09/01/2015 0538   CREATININE 0.69* 07/14/2015 1732   CALCIUM 7.8* 09/01/2015 0538   PROT 4.8* 08/31/2015 0441   ALBUMIN 1.5* 08/31/2015 0441   AST 26 08/31/2015 0441   ALT 15* 08/31/2015 0441   ALKPHOS 425* 08/31/2015 0441   BILITOT 1.6* 08/31/2015 0441   GFRNONAA >60 09/01/2015 0538   GFRAA >60 09/01/2015 0538       Problem List:  Patient Active Problem List   Diagnosis Date Noted  . Leg pain, bilateral   . Absolute anemia   . 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 Acute deep vein thrombosis (DVT) of proximal vein of lower extremity ([facility])   . Metastatic cancer ([place])   . Palliative care encounter 08/27/2015  . DNR (do not resuscitate) discussion 08/27/2015  . Pain, cancer 08/27/2015  . Colon cancer ([place]) 09/10/2015  . Abdominal pain 09/01/2015     Palliative Care Assessment & Plan    1.Code Status:  Full code-encouraged to consider DNR status knowing outcomes in like patients .    Code Status Orders        Start     Ordered   09/08/2015 1500  Full code   Continuous     08/24/2015 1459       2. Goals of Care/Additional Recommendations:  Continue current treatment plan, patient is hopeful for improvement and the possibility of treatment for his cancer, he awaits biopsy reports.   Desire for further Chaplaincy support:no-strong community church support  Psycho-social  Needs: Education on Hospice and Medicaid/Financial Assistance  3. Symptom Management:      1.  Pain: Continue with Dilaudid PCA through tomorrow, discussed with family medicine converting to oral agents s in preparation for discharge.  I do not recommend Fentanly patch 2/2 to poor absorption in cachectic patients.  Anticipate initiatation of  long acting agents  4. Palliative Prophylaxis:   Aspiration, Bowel Regimen, Delirium Protocol, Frequent Pain Assessment, Oral Care and Turn Reposition  5. Prognosis:  Likely less than six months.  I worry that Jesse Murray will be unable to ralley after this surgery and get strong enough to tolerate palliative chemotherapy.  Nutrition will be crititcal and his current albumin is 1.5.  TPN complicates disposition options.  He has very little social support, lives alone.  6. Discharge Planning:  Pending   Care plan was discussed with Family Medicne  Thank you for allowing the Palliative Medicine Team to assist in the care of this patient.   Time In: 1045 Time Out: 1130 Total Time 45 min Prolonged Time Billed  no         Knox Royalty, NP  09/01/2015, 9:48 AM  Please contact Palliative Medicine Team phone at [phone] for questions and concerns.

## 2015-09-01 NOTE — Consult Note (Signed)
Marland Kitchen    HEMATOLOGY/ONCOLOGY CONSULTATION NOTE  Date of Service: 09/01/2015  Patient Care Team: .Kinnie Feil, MD  CHIEF COMPLAINTS/PURPOSE OF CONSULTATION:  New abdominal malignancy.  HISTORY OF PRESENTING ILLNESS:  Jesse Murray is a wonderful 54 y.o. male who has been referred to [place] by Dr .Kinnie Feil, MD  for evaluation and management of newly diagnosed abdominal malignancy with suspicion for metastatic pancreatic cancer.  Mr Cortner is a 54 year old gentleman with a history of previous alcohol abuse who presented to the hospital on 08/15/2015 with two-month history of abdominal pain predominantly in the lower abdomen associated with nausea and vomiting. It was treated as a possible "stomach flu" but since it was persistent abdominal imaging was recommended that patient apparently did not follow-up for this. He has had alternating constipation and diarrhea but no overt GI bleeding. Anorexia with decreased appetite and weight loss of about 25-30 pounds in a month.  In a CT of his abdomen on 09/07/2015 which was concerning for possible obstruction of the colon at the hepatic flexure ?colon cancer. Extensive hepatic metastases. Extensive nodal metastases. Left adrenal metastasis. Severely obstructed cecum and distal small bowel. Bilateral common femoral venous thrombosis. Patient has had a ultrasound of his lower extremities ordered which is pending at this time.  Surgery was consulted in the setting of malignant obstruction and decided to proceed with pallitive resection. On 08/29/2015 the patient had an exploratory laparotomy with partial colectomy, ileostomy and mucous fistula. He was noted to have extensive intra-abdominal metastatic cancer. CEA levels were elevated to 107.3 and the CA-19-9 levels were elevated to [address], AFP tumor marker within normal limits.  Pathology was consistent with multiple foci of serosal based adenocarcinoma consistent with poorly differentiated  colorectal adenocarcinoma. Metastatic carcinoma was noted in 10 out of 25 resected lymph nodes. Lymphovascular invasion and perineural invasion noted.  Palliative care has been following the patient for symptom management. He has had nutritional consultation due to his severe protein calorie malnutrition and is currently on TPN due to Recent bowel surgery that has been started on clear liquids. NG tube has been removed.  He has been on IV heparin due to CT evidence of bilateral lower extremity DVTs triggered likely by his metastatic malignancy and immobilization. Being managed for acute kidney injury.  Apparently prior to his current presentation he had no significant chronic medical issues. Patient notes that he was quite active prior to the last 2-3 months and hasn't really had any significant medical problems. He notes drinking about 30-40 ounces of hard liquor daily for about 8 years but has been sober since 2010 and does not report any pancreatitis or liver problems related to his alcohol use.   He is quite keenTo be treated. He notes that his abdominal pain is better controlled and he certainly feels much better today compared to couple days back.   MEDICAL HISTORY:  Past Medical History  Diagnosis Date  . DVT (deep venous thrombosis) ([place]) dx'd ~ 08/22/2015    RLE  . Small bowel obstruction ([place]) 09/12/2015  . Abdominal mass 08/31/2015  . Headache     "weekly" (09/02/2015)  . Colon cancer ([place]) dx'd 08/30/2015    stage IV/notes 09/03/2015    SURGICAL HISTORY: Past Surgical History  Procedure Laterality Date  . Knee arthroscopy Left 1998    cartilage repair  . Laparotomy N/A 09/09/2015    Procedure: EXPLORATORY LAPAROTOMY;  Surgeon: Coralie Keens, MD;  Location: [place];  Service: General;  Laterality: N/A;  .  Ostomy N/A 08/27/2015    Procedure: OSTOMY;  Surgeon: Coralie Keens, MD;  Location: [place];  Service: General;  Laterality: N/A;  Mucous Ostomy  . Partial  colectomy N/A 09/05/2015    Procedure: PARTIAL COLECTOMY;  Surgeon: Coralie Keens, MD;  Location: [facility];  Service: General;  Laterality: N/A;  . Ileostomy N/A 08/21/2015    Procedure: ILEOSTOMY;  Surgeon: Coralie Keens, MD;  Location: [facility];  Service: General;  Laterality: N/A;    SOCIAL HISTORY: Social History   Social History  . Marital Status: Divorced    Spouse Name: N/A  . Number of Children: N/A  . Years of Education: N/A   Occupational History  . Not on file.   Social History Main Topics  . Smoking status: Never Smoker   . Smokeless tobacco: Never Used  . Alcohol Use: 0.0 oz/week    0 Standard drinks or equivalent per week     Comment: history of heavy alcohol abuse from 2003-2010; quit drinking in 2010  . Drug Use: No  . Sexual Activity: No   Other Topics Concern  . Not on file   Social History Narrative    FAMILY HISTORY: Family History  Problem Relation Age of Onset  . Deep vein thrombosis Father 23  . Melanoma Father   . Lung cancer Paternal Grandfather   . Cirrhosis Paternal Grandfather   . Heart attack    . Diabetes      ALLERGIES:  has No Known Allergies.  MEDICATIONS:  Current Facility-Administered Medications  Medication Dose Route Frequency Provider Last Rate Last Dose  . dextrose 5 % and 0.9 % NaCl with KCl 20 mEq/L infusion   Intravenous Continuous Valeda Malm Rumbarger, RPH 10 mL/hr at 08/30/15 1210    . diphenhydrAMINE (BENADRYL) injection 12.5 mg  12.5 mg Intravenous Q6H PRN Saverio Danker, PA-C       Or  . diphenhydrAMINE (BENADRYL) 12.5 MG/5ML elixir 12.5 mg  12.5 mg Oral Q6H PRN Saverio Danker, PA-C      . TPN (CLINIMIX-E) Adult   Intravenous Continuous TPN Almeta Monas Bajbus, RPH 83 mL/hr at 08/31/15 1754     And  . fat emulsion 20 % infusion 240 mL  240 mL Intravenous Continuous TPN Lauren D Bajbus, RPH 10 mL/hr at 08/31/15 1754 240 mL at 08/31/15 1754  . TPN (CLINIMIX-E) Adult   Intravenous Continuous TPN Lauren D Bajbus, RPH        And  . fat emulsion 20 % infusion 240 mL  240 mL Intravenous Continuous TPN Lauren D Bajbus, RPH      . fentaNYL (DURAGESIC - dosed mcg/hr) patch 25 mcg  25 mcg Transdermal Q72H Olam Idler, MD   25 mcg at 08/31/15 1403  . heparin ADULT infusion 100 units/mL (25000 units/250 mL)  2,200 Units/hr Intravenous Continuous Laren Everts, RPH 22 mL/hr at 09/01/15 0340 2,200 Units/hr at 09/01/15 0340  . HYDROmorphone (DILAUDID) 1 mg/mL PCA injection   Intravenous 6 times per day Earnstine Regal, PA-C   1.5 mg at 08/31/15 0920  . LORazepam (ATIVAN) injection 0.5 mg  0.5 mg Intravenous Q6H PRN Knox Royalty, NP   0.5 mg at 09/01/15 0340  . methocarbamol (ROBAXIN) 1,000 mg in dextrose 5 % 50 mL IVPB  1,000 mg Intravenous Q8H PRN [name], PA-C   1,000 mg at 08/31/15 2322  . naloxone [facility]) injection 0.4 mg  0.4 mg Intravenous PRN Saverio Danker, PA-C       And  .  sodium chloride 0.9 % injection 9 mL  9 mL Intravenous PRN Saverio Danker, PA-C      . ondansetron [facility]) injection 4 mg  4 mg Intravenous Q6H PRN Saverio Danker, PA-C      . pantoprazole (PROTONIX) injection 40 mg  40 mg Intravenous QHS Katheren Shams, DO   40 mg at 08/31/15 2127  . phenol (CHLORASEPTIC) mouth spray 1 spray  1 spray Mouth/Throat PRN Johnathan Hausen, MD      . sodium chloride 0.9 % injection 10-40 mL  10-40 mL Intracatheter Q12H Kinnie Feil, MD   10 mL at 09/01/15 0803  . sodium chloride 0.9 % injection 10-40 mL  10-40 mL Intracatheter PRN Kinnie Feil, MD   10 mL at 08/31/15 [id number]    REVIEW OF SYSTEMS:    10 Point review of Systems was done is negative except as noted above.  PHYSICAL EXAMINATION: ECOG PERFORMANCE STATUS: 2-3  . Filed Vitals:   09/01/15 0449 09/01/15 0800  BP: 112/71   Pulse: 97   Temp: 97.5 F (36.4 C)   Resp: 18 16   Filed Weights   08/24/2015 0923 08/23/2015 1358  Weight: 150 lb (68.04 kg) 160 lb 8 oz (72.802 kg)   .Body mass index is 24.41 kg/(m^2).  GENERAL:alert, middle-aged  gentleman in no acute distress. Getting TPN and on Dilaudid PCA. SKIN: no acute rashes EYES: Conjunctival pallor, minimal scleral icterus. OROPHARYNX:no exudate, no erythema and lips, buccal mucosa, and tongue normal. NECK: supple, no JVD, thyroid normal size, non-tender, without nodularity LYMPH:  no palpable lymphadenopathy in the cervical, axillary or inguinal LUNGS: clear to auscultation with normal respiratory effort HEART: regular rate & rhythm,  no murmurs. ABDOMEN: midline surgical incision with staples with right-sided ileostomy, hypoactive bowel sounds. Musculoskeletal: bilateral pitting pedal edema 2-3+ , tenderness to palpation of both calfs. PSYCH: alert & oriented x 3 with fluent speech NEURO: no focal motor/sensory deficits  LABORATORY DATA:  I have reviewed the data as listed  . CBC Latest Ref Rng 09/01/2015 08/31/2015 08/31/2015  WBC 4.0 - 10.5 K/uL 12.7(H) - 10.8(H)  Hemoglobin 13.0 - 17.0 g/dL 7.2(L) 8.6(L) 7.0(L)  Hematocrit 39.0 - 52.0 % 22.7(L) 27.8(L) 22.7(L)  Platelets 150 - 400 K/uL 121(L) - 132(L)   . CBC    Component Value Date/Time   WBC 12.7* 09/01/2015 0538   WBC 11.1* 07/14/2015 1745   RBC 2.58* 09/01/2015 0538   RBC 4.22* 07/14/2015 1745   HGB 7.2* 09/01/2015 0538   HGB 12.3* 07/14/2015 1745   HCT 22.7* 09/01/2015 0538   HCT 36.5* 07/14/2015 1745   PLT 121* 09/01/2015 0538   MCV 88.0 09/01/2015 0538   MCV 86.3 07/14/2015 1745   MCH 27.9 09/01/2015 0538   MCH 29.2 07/14/2015 1745   MCHC 31.7 09/01/2015 0538   MCHC 33.8 07/14/2015 1745   RDW 17.1* 09/01/2015 0538   LYMPHSABS 1.6 08/31/2015 0441   MONOABS 0.5 08/31/2015 0441   EOSABS 0.4 08/31/2015 0441   BASOSABS 0.1 08/31/2015 0441     . CMP Latest Ref Rng 09/01/2015 08/31/2015 08/30/2015  Glucose 65 - 99 mg/dL 120(H) 123(H) 126(H)  BUN 6 - 20 mg/dL 9 8 12   Creatinine 0.61 - 1.24 mg/dL 0.64 0.70 0.81  Sodium 135 - 145 mmol/L 134(L) 139 142  Potassium 3.5 - 5.1 mmol/L 3.6 3.3(L)  3.7  Chloride 101 - 111 mmol/L 106 111 115(H)  CO2 22 - 32 mmol/L 22 23 22   Calcium 8.9 - 10.3 mg/dL  7.8(L) 7.6(L) 7.3(L)  Total Protein 6.5 - 8.1 g/dL - 4.8(L) 4.6(L)  Total Bilirubin 0.3 - 1.2 mg/dL - 1.6(H) 1.3(H)  Alkaline Phos 38 - 126 U/L - 425(H) 387(H)  AST 15 - 41 U/L - 26 30  ALT 17 - 63 U/L - 15(L) 18   . Lab Results  Component Value Date   CEA 107.3* 08/30/2015    . Lab Results  Component Value Date   CA199 29262* 08/17/2015      RADIOGRAPHIC STUDIES: I have personally reviewed the radiological images as listed and agreed with the findings in the report. Dg Chest 2 View  08/29/2015  CLINICAL DATA:  54 year old male with right lower quadrant abdominal pain and lower extremity swelling since last week. Nausea and vomiting for 2 weeks. Weakness. Initial encounter. EXAM: CHEST  2 VIEW COMPARISON:  CT Abdomen and Pelvis from today reported separately, and earlier. FINDINGS: Lung volumes are within normal limits. Normal cardiac size and mediastinal contours. No pneumothorax or pulmonary edema. No pleural effusion or consolidation. No pulmonary nodule identified. No acute osseous abnormality identified. Abnormal bowel gas in the abdomen, see comparison. IMPRESSION: No acute cardiopulmonary abnormality. See CT Abdomen and Pelvis from today reported separately. Electronically Signed   By: Genevie Ann M.D.   On: 09/03/2015 11:21   Dg Abd 1 View  08/31/2015  CLINICAL DATA:  Right abdominal pain and distention for 2 weeks. Constipation. EXAM: ABDOMEN - 1 VIEW COMPARISON:  07/14/2015 FINDINGS: There is marked distention of a loop of bowel, presumably colon. This finding is associated with a relative paucity of remaining bowel gas with a minimal amount of air seen within several loops of small bowel with index loop of small bowel in the left upper abdominal quadrant with index loop of presumably small bowel within the left upper abdominal quadrant measuring approximately 4.3 cm in diameter.  Nondiagnostic evaluation for pneumoperitoneum secondary to exclusion of the lower thorax. No definite pneumatosis or portal venous gas. No definite abnormal intra-abdominal calcifications. IMPRESSION: Findings worrisome for intestinal volvulus, potentially a cecal volvulus. Further evaluation with abdominal CT could be performed as indicated. Critical Value/emergent results were called by telephone at the time of interpretation on 09/04/2015 at 10:04 am to Dr. Davonna Belling , who verbally acknowledged these results. Electronically Signed   By: Sandi Mariscal M.D.   On: 09/08/2015 10:07   Ct Abdomen Pelvis W Contrast  08/25/2015  ADDENDUM REPORT: 09/11/2015 11:52 ADDENDUM: Study discussed by telephone with Dr. Davonna Belling on 08/24/2015 at 1132 hours. Electronically Signed   By: Genevie Ann M.D.   On: 09/04/2015 11:52  08/16/2015  CLINICAL DATA:  54 year old male with abdominal pain for 2 weeks and lower extremity swelling greater on the right. Unintentional weight loss for 8 weeks. Abnormal bowel gas pattern today. Initial encounter. EXAM: CT ABDOMEN AND PELVIS WITH CONTRAST TECHNIQUE: Multidetector CT imaging of the abdomen and pelvis was performed using the standard protocol following bolus administration of intravenous contrast. CONTRAST:  161mL OMNIPAQUE IOHEXOL 300 MG/ML  SOLN COMPARISON:  Radiographs from today and earlier. FINDINGS: There are a small number of subtle lung base pulmonary nodules (left lung series 3, images 1 and 2). There is a 12 mm more irregular pulmonary nodule in the right lateral costophrenic angle. No pleural effusion. No pericardial effusion. No destructive or suspicious osseous lesion identified. However, there is a a suspicious small right posterior iliac muscle mass measuring 11 mm on series 2, image 69. No abdominal free air. There is  trace abdominal free fluid primarily in the right gutter. There is an obstructing tumor of the colon at the hepatic flexure with indistinct  margins suggesting mesenteric invasion and adjacent mesenteric nodal disease. The mass encompasses 55 x 66 x 46 mm (AP by transverse by CC). Surrounding irregular mesenteric nodal disease. Associated ipsilateral and contralateral retroperitoneal nodal disease, up to 17 mm short axis at the level of the left main renal vessels. Widespread hepatic metastatic disease. Hepatic metastases individually up to 5.5 cm diameter. This cecum is massively distended. The terminal ileum and distal small bowel are obstructed as expected and measure up to 4-1/2 cm diameter. Distal to the obstructing hepatic flexure tumor the colon is decompressed, with diverticulosis. Diminutive urinary bladder. Diminutive stomach. The tumor is inseparable from the second portion of the duodenum on coronal. The duodenum otherwise appears within normal limits. There is a small indeterminate 11 mm hypodense lesion in the spleen. The pancreas is within normal limits. 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 The kidneys are within normal limits. The portal venous system is patent. Major arterial structures in the abdomen and pelvis are patent. There is an indistinct appearance of both common femoral veins in the pelvis on series 2, image 85. Internal all decreased density at both. No inguinal lymphadenopathy. The IVC is patent. The common iliac veins are patent on the delayed images. No pelvic lymphadenopathy. IMPRESSION: 1. 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 Extensive hepatic metastases. Extensive nodal metastases. Left adrenal metastasis. 2. Severely obstructed cecum and distal small bowel. No free air. Trace free fluid. 3. Bilateral common femoral venous thrombosis suspected. Other major vascular structures appear patent. 4. Small indeterminate lung base nodules, indeterminate lesions in the spleen, and right posterior pelvic musculature. Electronically Signed: By: Genevie Ann M.D. On: 08/14/2015 11:30   Dg Chest Port 1 View  08/29/2015  CLINICAL DATA:  Encounter for PICC line placement EXAM: PORTABLE CHEST 1 VIEW COMPARISON:  08/25/2015 FINDINGS: Nasogastric tube is in the stomach. There is a right arm PICC line with tip in the cavoatrial junction. Normal heart size. Atelectasis is identified in the left base. IMPRESSION: Right arm PICC line tip is in the cavoatrial junction. Left base atelectasis. Electronically Signed   By: Kerby Moors M.D.   On: 08/29/2015 14:52    ASSESSMENT & PLAN:   54 year old gentleman with history of previous alcohol abuse with  #1 newly diagnosed metastatic abdominal malignancy likely stage IV metastatic colorectal cancer based on pathology. Extensive perineural and lymphovascular invasion. CT of the abdomen did not show a discrete pancreatic mass. Elevated CEA level at 107. Significantly elevated CA-19-9 at nearly 30k ?related to pancreatic infiltration. Cannot rule out possibility of possible pancreatic cancer.  CT evidence of extensive liver metastases, nodal metastases, left adrenal metastasis, indeterminate indeterminate lung and splenic nodules. Patient presented with colonic obstruction and the status post partial colectomy with ileostomy. He is currently on TPN . He seems to have good ileostomy output. He is transitioning to clear liquids orally.  #2 severe protein calorie malnutrition due to his underlying metastatic malignancy-currently on TPN.transitioning to oral intake  #3 abnormal liver function tests -likely due to liver metastases. Uncertain baseline liver function with his previous history of alcohol abuse.patient notes that he used to consume 30-40 ounces of hard liquor daily x 70yrs and never had any liver or pancreatic problems related alcohol use.  #4 acute kidney injury.-Likely  due to poor oral intake and possibly from IV contrast. Resolved  #5 bilateral lower extremity DVTs based on CT -on IV heparin  currently.  #6 Normocytic normochromic anemia - likely multifactorial due to metastatic malignancy, blood loss with surgery, poor oral intake.  Plan  -patient has widely metastatic tumor. Pathology and imaging suggestive of metastatic colorectal cancer the patient also simultaneously has a significantly elevated [id number] which could certainly be due to pancreatic infiltration from tumor. Cannot completely r/o the possibility of metastatic pancreatic cancer. -metastatic colorectal cancer would certainly have a much better prognosis than metastatic pancreatic adenocarcinoma. -After discussion of goals of care with the patient it appears he has been fairly healthy overall without low burden of chronic medical issues and good performance status prior to decompensation with his new diagnosis. -he chooses to consider palliative chemotherapy after recovering from his surgery. -Will need to be discharged to a skilled nursing facility for rehabilitation. -We will plan to set him up for follow-up with Dr. Irene Limbo in 2weeks to reassess his performance status and decide on final treatment plans and after getting a whole body PET/CT scan to complete his staging workup. -We will likely treat this as metastatic colorectal cancer with palliative FOLFOX which would have activity against pancreatic cancer as well. -would recommend transfusing 2 units of PRBCs for symptomatic anemia. -Would plan to have a port placed for FOLFOX chemotherapy as outpatient if we make a final decision to proceed with this plan. -Palliative care following for symptom management -currently on Dilaudid PCA and will need to transition to an oral pain management regimen. -On IV heparin treatment of bilateral lower extremity DVTs due to his malignancy and immobilization. Might eventually need to transition to Lovenox on discharge if his renal function is stable. Would not recommend IVC filter placement unless there is an absolute contraindication  to anticoagulation. -Would anticipate lifelong anticoagulation in the absence of significant bleeding issues. -Avoid nephrotoxic medications.  All of the patients questions were answered to his apparent apparent satisfaction. The patient knows to call the clinic with any problems, questions or concerns.  I spent 70 minutes counseling the patient face to face. The total time spent in the appointment was 80 minutes and more than 50% was on counseling and direct patient cares and coordination of care with the hospitalist team    Sullivan Lone MD [place] AAHIVMS [facility] Hematology/Oncology Physician [facility]  (Office):       [phone] (Work cell):  [phone] (Fax):           [phone]  09/01/2015 8:49 AM

## 2015-09-01 NOTE — Clinical Social Work Note (Signed)
CSW met with patient to discuss SNF placement.  Patient was explained process for SNF placement search and also informed that since he is self-pay his options are limited on where he can go to a SNF.  CSW explained where the different facilities are expressed how the facilities will have to review his information and then make a decision if they can take patient.  CSW provided psychosocial support for patient having been newly diagnosed with colon cancer, and offered to have a chaplain see patient, however patient stated he did not want a chaplain to come by because he is [employer] and has his own religious profession who can see him.  Patient stated he does not have any family nearby, his sister lives in [place], but she will not be in [place] until next week.  Patient stated he is in agreement to going to SNF if he has too, and CSW will begin SNF search process.  CSW to continue to follow patient's progress, formal assessment to follow.  Jesse Murray. [place], MSW, [place] 09/01/2015 5:55 PM

## 2015-09-01 NOTE — Progress Notes (Signed)
Oncology Short Note  Still awaiting pathology results. Will see patient today.    Sullivan Lone MD MS Hematology/Oncology Physician [facility]

## 2015-09-01 NOTE — Progress Notes (Signed)
[place] NOTE   Pharmacy Consult for TPN Indication: SBO, malnutrition in the setting of metastatic ca  No Known Allergies  Patient Measurements: Height: '5\' 8"'$  (172.7 cm) Weight: 160 lb 8 oz (72.802 kg) IBW/kg (Calculated) : 68.4  Vital Signs: Temp: 97.5 F (36.4 C) (12/20 0449) Temp Source: Oral (12/20 0449) BP: 112/71 mmHg (12/20 0449) Pulse Rate: 97 (12/20 0449) Intake/Output from previous day: 12/19 0701 - 12/20 0700 In: 1891.2 [P.O.:770; I.V.:262.5; TPN:858.7] Out: 2425 [Urine:1550; Stool:875] Intake/Output from this shift:    Labs:  Recent Labs  08/30/15 1450 08/31/15 0441 08/31/15 1606 09/01/15 0538  WBC 12.4* 10.8*  --  12.7*  HGB 7.3* 7.0* 8.6* 7.2*  HCT 23.4* 22.7* 27.8* 22.7*  PLT 146* 132*  --  121*     Recent Labs  08/30/15 0447 08/31/15 0441 09/01/15 0538  NA 142 139 134*  K 3.7 3.3* 3.6  CL 115* 111 106  CO2 '22 23 22  '$ GLUCOSE 126* 123* 120*  BUN '12 8 9  '$ CREATININE 0.81 0.70 0.64  CALCIUM 7.3* 7.6* 7.8*  MG 1.7 1.6* 1.7  PHOS 2.6 2.4* 3.6  PROT 4.6* 4.8*  --   ALBUMIN 1.5* 1.5*  --   AST 30 26  --   ALT 18 15*  --   ALKPHOS 387* 425*  --   BILITOT 1.3* 1.6*  --   PREALBUMIN  --  2.6*  --   TRIG 100 137  --    Estimated Creatinine Clearance: 103.3 mL/min (by C-G formula based on Cr of 0.64).    Recent Labs  08/31/15 1944 09/01/15 0002 09/01/15 0452  GLUCAP 119* 110* 116*   Insulin Requirements in the past 24 hours:  0 units SSI  Current Nutrition:  Clinimix E 5/15 @ 64m/hr + lipids 20% at 120mhr - provides 100g protein (100% goal) and 1894 Kcal (95% goal) Clear Liquid Diet- 50% intake of dinner last night  Nutritional Goals:  Per RD 12/17: 2000-2200 kCal/day 95-110 grams of protein per day  Assessment: [age] presented to the hospital with abdominal pain, nausea and vomiting. Found to have metastatic cancer (colon vs pancreatic) with massive cecal dilitation. Pt has had significant weight loss  and poor appetite over the past month. Palliative care is involved.   Surgeries/Procedures: 12/16 Exlap, partial colectomy, ileostomy and mucus fistula  GI: s/p exlap- ongoing weight loss and poor PO intake for several weeks. Prealbumin dropped further to 2.6. FOB negative, NG tube removed. LBM 12/19. PPI  Endo: No hx DM - has not required insulin, discontinued  Lytes: K 3.6, phos 3.6, mag 1.7, CorCa ~9.8  Renal: SCr 0.64, UOP 0.9- D5NS + KCl 2069mat KVOHiko6/RA  Cards: BP remains on softer end, HR nml-tachy  Hepatobil: Alk phos elevated further at 425, Tbili increased to 1.6 - possibly related to liver mets. ALT/AST ok, albumin low. Trigs remain normal at 137  Neuro: Hx EtOH abuse - Dilaudid PCA - pain scores 0-8  ID: Afebrile, WBC 12.7, no abx    AC/Heme: Heparin for bilateral DVT - level remains therapeutic. Hgb 7.2, plts 121- no overt bleeding noted  Best Practices: SCDs/IV heparin  TPN Access: PICC placed 12/17 TPN start date: 12/17>>  Plan:  - Continue Clinimix E5/15 at 10m84m + lipids 20% at 10ml105m- provides 100g protein (100% goal) and 1894 Kcal (95% goal) - MVI + TE daily in TPN - Continue D5NS with 20KCl at KVO -[facility] -  BMET, mag and phos in AM  -continue heparin at 2200 units/hr -daily HL and CBC  Nichola Warren D. Idrees Quam, PharmD, BCPS Clinical Pharmacist Pager: [phone] 09/01/2015 7:44 AM

## 2015-09-01 NOTE — Progress Notes (Signed)
[facility] Progress Note  4 Days Post-Op  Subjective: Pt's pain well controlled on PCA.  Up to the chair today.  Ambulating some.  No N/V, tolerated clears well.  Having flatus and BM in bag.  Output was 841mL/24hr.  Objective: Vital signs in last 24 hours: Temp:  [97.5 F (36.4 C)-98.5 F (36.9 C)] 97.5 F (36.4 C) (12/20 0449) Pulse Rate:  [97-120] 120 (12/20 1006) Resp:  [14-23] 16 (12/20 0800) BP: (112-130)/(65-74) 112/71 mmHg (12/20 0449) SpO2:  [94 %-100 %] 94 % (12/20 1006) Last BM Date: 09/01/15  Intake/Output from previous day: 12/19 0701 - 12/20 0700 In: 1891.2 [P.O.:770; I.V.:262.5; TPN:858.7] Out: 2425 [Urine:1550; Stool:875] Intake/Output this shift: Total I/O In: 240 [P.O.:240] Out: 120 [Urine:120]  PE: Gen:  Alert, NAD, pleasant Abd: Soft, mild tenderness at incision site, staples in place, +BS, no HSM, incisions C/D/I  Lab Results:   Recent Labs  08/31/15 0441 08/31/15 1606 09/01/15 0538  WBC 10.8*  --  12.7*  HGB 7.0* 8.6* 7.2*  HCT 22.7* 27.8* 22.7*  PLT 132*  --  121*   BMET  Recent Labs  08/31/15 0441 09/01/15 0538  NA 139 134*  K 3.3* 3.6  CL 111 106  CO2 23 22  GLUCOSE 123* 120*  BUN 8 9  CREATININE 0.70 0.64  CALCIUM 7.6* 7.8*   PT/INR No results for input(s): LABPROT, INR in the last 72 hours. CMP     Component Value Date/Time   NA 134* 09/01/2015 0538   K 3.6 09/01/2015 0538   CL 106 09/01/2015 0538   CO2 22 09/01/2015 0538   GLUCOSE 120* 09/01/2015 0538   BUN 9 09/01/2015 0538   CREATININE 0.64 09/01/2015 0538   CREATININE 0.69* 07/14/2015 1732   CALCIUM 7.8* 09/01/2015 0538   PROT 4.8* 08/31/2015 0441   ALBUMIN 1.5* 08/31/2015 0441   AST 26 08/31/2015 0441   ALT 15* 08/31/2015 0441   ALKPHOS 425* 08/31/2015 0441   BILITOT 1.6* 08/31/2015 0441   GFRNONAA >60 09/01/2015 0538   GFRAA >60 09/01/2015 0538   Lipase     Component Value Date/Time   LIPASE 31 08/25/2015 0940        Studies/Results: No results found.  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 robaxin -WOC nursing following for ostomy/mucous fistula teaching -Ordered PT -Staples out between 10-14 days post op  Path report shows: Colon, segmental resection for tumor, Right - MULTIPLE FOCI OF SEROSAL BASED ADENOCARCINOMA, CONSISTENT WITH METASTATIC COLORECTAL ADENOCARCINOMA, POORLY DIFFERENTIATED. - METASTATIC CARCINOMA IN 10 OF 25 LYMPH NODES (10/25). - LYMPHOVASCULAR INVASION IS IDENTIFIED, DIFFUSE. - PERINEURAL INVASION IS IDENTIFIED. - SEE COMMENT.  Liver,  left adrenal and mesenteric nodal metastasis by CT Bilateral common femoral venous thrombosis by CT Small intermediate lung nodules Hyponatremia Acute renal insuffiencey  Elevated LFT's related to liver metastasis Weight loss 30-40 lbs over 1.5 months PCM & deconditioning - prealbumin 7.6 on TPN, PT DVT treatment - Heparin drip ABL Anemia - Hgb 7.2 this am, consider stopping heparin drip?    LOS: 6 days    [name] 09/01/2015, 11:28 AM Pager: [phone]

## 2015-09-02 ENCOUNTER — Telehealth: Payer: Self-pay | Admitting: Hematology

## 2015-09-02 DIAGNOSIS — C800 Disseminated malignant neoplasm, unspecified: Secondary | ICD-10-CM

## 2015-09-02 DIAGNOSIS — Z515 Encounter for palliative care: Secondary | ICD-10-CM

## 2015-09-02 DIAGNOSIS — C183 Malignant neoplasm of hepatic flexure: Secondary | ICD-10-CM | POA: Insufficient documentation

## 2015-09-02 DIAGNOSIS — I824Y3 Acute embolism and thrombosis of unspecified deep veins of proximal lower extremity, bilateral: Secondary | ICD-10-CM

## 2015-09-02 DIAGNOSIS — Z932 Ileostomy status: Secondary | ICD-10-CM

## 2015-09-02 DIAGNOSIS — E43 Unspecified severe protein-calorie malnutrition: Secondary | ICD-10-CM

## 2015-09-02 DIAGNOSIS — K56609 Unspecified intestinal obstruction, unspecified as to partial versus complete obstruction: Secondary | ICD-10-CM

## 2015-09-02 DIAGNOSIS — R600 Localized edema: Secondary | ICD-10-CM

## 2015-09-02 DIAGNOSIS — K5669 Other intestinal obstruction: Principal | ICD-10-CM

## 2015-09-02 DIAGNOSIS — C799 Secondary malignant neoplasm of unspecified site: Secondary | ICD-10-CM | POA: Insufficient documentation

## 2015-09-02 LAB — CBC
HEMATOCRIT: 28.2 % — AB (ref 39.0–52.0)
Hemoglobin: 9.3 g/dL — ABNORMAL LOW (ref 13.0–17.0)
MCH: 28.4 pg (ref 26.0–34.0)
MCHC: 33 g/dL (ref 30.0–36.0)
MCV: 86 fL (ref 78.0–100.0)
Platelets: 98 10*3/uL — ABNORMAL LOW (ref 150–400)
RBC: 3.28 MIL/uL — AB (ref 4.22–5.81)
RDW: 15.7 % — ABNORMAL HIGH (ref 11.5–15.5)
WBC: 16.3 10*3/uL — AB (ref 4.0–10.5)

## 2015-09-02 LAB — GLUCOSE, CAPILLARY
GLUCOSE-CAPILLARY: 119 mg/dL — AB (ref 65–99)
Glucose-Capillary: 97 mg/dL (ref 65–99)

## 2015-09-02 LAB — TYPE AND SCREEN
ABO/RH(D): O POS
Antibody Screen: NEGATIVE
UNIT DIVISION: 0
Unit division: 0

## 2015-09-02 LAB — BASIC METABOLIC PANEL
Anion gap: 7 (ref 5–15)
BUN: 10 mg/dL (ref 6–20)
CALCIUM: 7.8 mg/dL — AB (ref 8.9–10.3)
CHLORIDE: 102 mmol/L (ref 101–111)
CO2: 22 mmol/L (ref 22–32)
CREATININE: 0.56 mg/dL — AB (ref 0.61–1.24)
GFR calc non Af Amer: >60 mL/min (ref >60)
Glucose, Bld: 113 mg/dL — ABNORMAL HIGH (ref 65–99)
Potassium: 3.8 mmol/L (ref 3.5–5.1)
SODIUM: 131 mmol/L — AB (ref 135–145)

## 2015-09-02 LAB — MAGNESIUM: Magnesium: 1.5 mg/dL — ABNORMAL LOW (ref 1.7–2.4)

## 2015-09-02 LAB — HEPARIN LEVEL (UNFRACTIONATED): Heparin Unfractionated: 0.29 IU/mL — ABNORMAL LOW (ref 0.30–0.70)

## 2015-09-02 LAB — PHOSPHORUS: Phosphorus: 3.7 mg/dL (ref 2.5–4.6)

## 2015-09-02 MED ORDER — HYDROMORPHONE 1 MG/ML IV SOLN: INTRAVENOUS | Status: DC

## 2015-09-02 MED ORDER — OXYCODONE HCL 5 MG PO TABS
10.0000 mg | ORAL_TABLET | ORAL | Status: DC | PRN
  Administered 2015-09-02 – 2015-09-04 (×8): 10 mg via ORAL
  Filled 2015-09-02 (×8): qty 2

## 2015-09-02 MED ORDER — LIP MEDEX EX OINT: 1.0000 "application " | TOPICAL_OINTMENT | Freq: Two times a day (BID) | CUTANEOUS | Status: DC

## 2015-09-02 MED ORDER — MENTHOL 3 MG MT LOZG: 1.0000 | LOZENGE | OROMUCOSAL | Status: DC | PRN

## 2015-09-02 MED ORDER — ENSURE ENLIVE PO LIQD
237.0000 mL | Freq: Two times a day (BID) | ORAL | Status: DC
  Administered 2015-09-02 – 2015-09-12 (×13): 237 mL via ORAL

## 2015-09-02 MED ORDER — MAGNESIUM SULFATE 2 GM/50ML IV SOLN
2.0000 g | Freq: Once | INTRAVENOUS | Status: AC
Start: 2015-09-02 — End: 2015-09-02
  Administered 2015-09-02: 2 g via INTRAVENOUS
  Filled 2015-09-02: qty 50

## 2015-09-02 MED ORDER — BISMUTH SUBSALICYLATE 262 MG/15ML PO SUSP
30.0000 mL | Freq: Three times a day (TID) | ORAL | Status: DC | PRN
  Filled 2015-09-02: qty 118

## 2015-09-02 MED ORDER — OXYCODONE HCL 5 MG PO TABS: 5.0000 mg | ORAL_TABLET | Freq: Four times a day (QID) | ORAL | Status: DC

## 2015-09-02 MED ORDER — OXYCODONE HCL ER 15 MG PO T12A
40.0000 mg | EXTENDED_RELEASE_TABLET | Freq: Two times a day (BID) | ORAL | Status: DC
  Administered 2015-09-02 (×2): 40 mg via ORAL
  Filled 2015-09-02 (×2): qty 1

## 2015-09-02 MED ORDER — ACETAMINOPHEN 500 MG PO TABS
1000.0000 mg | ORAL_TABLET | Freq: Three times a day (TID) | ORAL | Status: DC
  Administered 2015-09-02 – 2015-09-05 (×12): 1000 mg via ORAL
  Filled 2015-09-02 (×12): qty 2

## 2015-09-02 MED ORDER — DIPHENHYDRAMINE HCL 50 MG/ML IJ SOLN: 12.5000 mg | Freq: Four times a day (QID) | INTRAMUSCULAR | Status: DC | PRN

## 2015-09-02 MED ORDER — MAGIC MOUTHWASH
15.0000 mL | Freq: Four times a day (QID) | ORAL | Status: DC | PRN
  Filled 2015-09-02: qty 15

## 2015-09-02 MED ORDER — FENTANYL 75 MCG/HR TD PT72: 75.0000 ug | MEDICATED_PATCH | TRANSDERMAL | Status: DC

## 2015-09-02 MED ORDER — HYDROMORPHONE HCL 1 MG/ML IJ SOLN
1.0000 mg | INTRAMUSCULAR | Status: DC | PRN
  Administered 2015-09-02 (×3): 1 mg via INTRAVENOUS
  Filled 2015-09-02: qty 2
  Filled 2015-09-02: qty 1

## 2015-09-02 MED ORDER — DIPHENHYDRAMINE HCL 12.5 MG/5ML PO ELIX: 12.5000 mg | ORAL_SOLUTION | Freq: Four times a day (QID) | ORAL | Status: DC | PRN

## 2015-09-02 MED ORDER — SODIUM CHLORIDE 0.9 % IJ SOLN: 9.0000 mL | INTRAMUSCULAR | Status: DC | PRN

## 2015-09-02 MED ORDER — LOPERAMIDE HCL 2 MG PO CAPS: 2.0000 mg | ORAL_CAPSULE | Freq: Three times a day (TID) | ORAL | Status: DC | PRN

## 2015-09-02 MED ORDER — BLISTEX MEDICATED EX OINT
TOPICAL_OINTMENT | Freq: Two times a day (BID) | CUTANEOUS | Status: DC
  Administered 2015-09-02 – 2015-09-10 (×10): via TOPICAL
  Administered 2015-09-10: 1 via TOPICAL
  Administered 2015-09-11 – 2015-09-13 (×5): via TOPICAL
  Filled 2015-09-02 (×3): qty 10

## 2015-09-02 MED ORDER — ALUM & MAG HYDROXIDE-SIMETH 200-200-20 MG/5ML PO SUSP: 30.0000 mL | Freq: Four times a day (QID) | ORAL | Status: DC | PRN

## 2015-09-02 MED ORDER — NALOXONE HCL 0.4 MG/ML IJ SOLN: 0.4000 mg | INTRAMUSCULAR | Status: DC | PRN

## 2015-09-02 MED ORDER — HYDROMORPHONE HCL 1 MG/ML IJ SOLN
1.0000 mg | INTRAMUSCULAR | Status: DC | PRN
  Administered 2015-09-02 – 2015-09-03 (×2): 1 mg via INTRAVENOUS
  Filled 2015-09-02 (×2): qty 1

## 2015-09-02 MED ORDER — ONDANSETRON HCL 4 MG/2ML IJ SOLN: 4.0000 mg | Freq: Four times a day (QID) | INTRAMUSCULAR | Status: DC | PRN

## 2015-09-02 MED ORDER — OXYCODONE HCL 5 MG PO TABS: 15.0000 mg | ORAL_TABLET | ORAL | Status: DC | PRN

## 2015-09-02 NOTE — Progress Notes (Signed)
Pt too weak to stand for orthostatic vital signs.

## 2015-09-02 NOTE — Clinical Social Work Note (Addendum)
CSW has faxed out patient's clinicals to SNFs to find placement for patient.  CSW to continue to follow patient's progress, FL2 awaiting signature by physician.  Jones Broom. [place], MSW, [place] 09/02/2015 5:48 PM

## 2015-09-02 NOTE — Progress Notes (Signed)
[facility]  [address] [place], [place] [address] Phone: [phone] FAX: [phone]   Jesse Murray [id number] 03/29/1961   Assessment  Fair  Problem List:   Active Problems:   Colon cancer [facility])   Abdominal pain   Palliative care encounter   DNR (do not resuscitate) discussion   Pain, cancer   Acute deep vein thrombosis (DVT) of proximal vein of lower extremity ([place])   Metastatic cancer ([place])   Absolute anemia   Malignant neoplasm of hepatic flexure ([facility])   Leg pain, bilateral   5 Days Post-Op  08/15/2015 - 09/08/2015  Procedure(s): EXPLORATORY LAPAROTOMY OSTOMY PARTIAL COLECTOMY ILEOSTOMY MUCUS FISTULA OF COLON   Plan:  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 DNR status? -Change PCA - inc interval.  Start PO oxycodone QID w bereakthrough.  Stop fent patch per palliative care recs -WOC nursing following for ileostomy/mucous fistula teaching -Ordered PT -Staples out between 10-14 days post op  Path report shows: Colon, segmental resection for tumor, Right - MULTIPLE FOCI OF SEROSAL BASED ADENOCARCINOMA, CONSISTENT WITH METASTATIC COLORECTAL ADENOCARCINOMA, POORLY DIFFERENTIATED. - METASTATIC CARCINOMA IN 10 OF 25 LYMPH NODES (10/25). - LYMPHOVASCULAR INVASION IS IDENTIFIED, DIFFUSE. - PERINEURAL INVASION IS IDENTIFIED. - SEE COMMENT.  Liver, left adrenal and mesenteric nodal metastasis by CT Bilateral common femoral venous thrombosis by CT Small intermediate lung nodules Hyponatremia Acute renal insuffiencey  Elevated LFT's related to liver metastasis Weight loss 30-40 lbs over 1.5 months PCM & deconditioning - prealbumin 7.6 on TPN, PT DVT treatment -  Heparin drip ABL Anemia - Hgb increased post transfusion  Adin Hector, M.D., F.A.C.S. Gastrointestinal and Minimally Invasive Surgery [facility] [address], [place],  [address] [phone] Main / Paging   09/02/2015  Subjective:  In pain Hates the bed Tol fulls PCA not working as well  Objective:  Vital signs:  Filed Vitals:   09/02/15 0040 09/02/15 0218 09/02/15 0400 09/02/15 0442  BP: 118/73 103/44  115/67  Pulse: 101 108  100  Temp: 98.9 F (37.2 C) 98.8 F (37.1 C)  98.8 F (37.1 C)  TempSrc: Oral Oral  Oral  Resp: '22 21 22 23  '$ Height:      Weight:      SpO2: 96% 93% 99% 93%    Last BM Date: 09/01/15  Intake/Output   Yesterday:  12/20 0701 - 12/21 0700 In: 1160 [P.O.:240; I.V.:250; Blood:670] Out: 1390 [Urine:740; Stool:650] This shift:     Bowel function:  Flatus: Y  BM: Y  Drain: THIN EFFLUENT IN ILEOSTOMY  Physical Exam:  General: Pt awake/alert/oriented x4 in mild acute distress.  Cachectic Eyes: PERRL, normal EOM.  Sclera clear.  No icterus Neuro: CN II-XII intact w/o focal sensory/motor deficits. Lymph: No head/neck/groin lymphadenopathy Psych:  No delerium/psychosis/paranoia HENT: Normocephalic, Mucus membranes moist.  No thrush Neck: Supple, No tracheal deviation Chest: No chest wall pain w good excursion CV:  Pulses intact.  Regular rhythm MS: Normal AROM mjr joints.  No obvious deformity Abdomen: Soft.  Nondistended.  Mildly tender at incisions only.  Incision c/d/i.  No evidence of peritonitis.  No incarcerated hernias. Ext:  SCDs BLE.  No mjr edema.  No cyanosis Skin: No petechiae / purpura.  ANASARCA, esp  BLE  Results:   Labs: Results for orders placed or performed during the hospital encounter of 08/24/2015 (from the past 48 hour(s))  Glucose, capillary     Status: Abnormal   Collection Time: 08/31/15  7:33 AM  Result Value Ref Range   Glucose-Capillary 113 (H) 65 - 99  mg/dL  Glucose, capillary     Status: Abnormal   Collection Time: 08/31/15 12:16 PM  Result Value Ref Range   Glucose-Capillary 114 (H) 65 - 99 mg/dL  Hemoglobin and hematocrit, blood     Status: Abnormal   Collection Time: 08/31/15  4:06 PM  Result Value Ref Range   Hemoglobin 8.6 (L) 13.0 - 17.0 g/dL   HCT 27.8 (L) 39.0 - 52.0 %  Glucose, capillary     Status: Abnormal   Collection Time: 08/31/15  5:08 PM  Result Value Ref Range   Glucose-Capillary 108 (H) 65 - 99 mg/dL  Glucose, capillary     Status: Abnormal   Collection Time: 08/31/15  7:44 PM  Result Value Ref Range   Glucose-Capillary 119 (H) 65 - 99 mg/dL   Comment 1 Notify RN   Glucose, capillary     Status: Abnormal   Collection Time: 09/01/15 12:02 AM  Result Value Ref Range   Glucose-Capillary 110 (H) 65 - 99 mg/dL   Comment 1 Notify RN   Glucose, capillary     Status: Abnormal   Collection Time: 09/01/15  4:52 AM  Result Value Ref Range   Glucose-Capillary 116 (H) 65 - 99 mg/dL   Comment 1 Notify RN   CBC     Status: Abnormal   Collection Time: 09/01/15  5:38 AM  Result Value Ref Range   WBC 12.7 (H) 4.0 - 10.5 K/uL   RBC 2.58 (L) 4.22 - 5.81 MIL/uL   Hemoglobin 7.2 (L) 13.0 - 17.0 g/dL   HCT 22.7 (L) 39.0 - 52.0 %   MCV 88.0 78.0 - 100.0 fL   MCH 27.9 26.0 - 34.0 pg   MCHC 31.7 30.0 - 36.0 g/dL   RDW 17.1 (H) 11.5 - 15.5 %   Platelets 121 (L) 150 - 400 K/uL  Heparin level (unfractionated)     Status: None   Collection Time: 09/01/15  5:38 AM  Result Value Ref Range   Heparin Unfractionated 0.34 0.30 - 0.70 IU/mL    Comment:        IF HEPARIN RESULTS ARE BELOW EXPECTED VALUES, AND PATIENT DOSAGE HAS BEEN CONFIRMED, SUGGEST FOLLOW UP TESTING OF ANTITHROMBIN III LEVELS.   Basic metabolic panel     Status: Abnormal   Collection Time: 09/01/15  5:38 AM  Result Value Ref Range   Sodium 134 (L) 135 - 145 mmol/L   Potassium 3.6 3.5 - 5.1 mmol/L   Chloride 106 101 - 111 mmol/L   CO2 22 22 - 32 mmol/L    Glucose, Bld 120 (H) 65 - 99 mg/dL   BUN 9 6 - 20 mg/dL   Creatinine, Ser 0.64 0.61 - 1.24 mg/dL   Calcium 7.8 (L) 8.9 - 10.3 mg/dL   GFR calc non Af Amer >60 >60 mL/min   GFR calc Af Amer >60 >60 mL/min    Comment: (NOTE) The eGFR has been calculated using the CKD EPI equation. This calculation has not been validated in all clinical situations. eGFR's persistently <60 mL/min signify possible Chronic Kidney Disease.    Anion gap 6 5 - 15  Magnesium     Status: None  Collection Time: 09/01/15  5:38 AM  Result Value Ref Range   Magnesium 1.7 1.7 - 2.4 mg/dL  Phosphorus     Status: None   Collection Time: 09/01/15  5:38 AM  Result Value Ref Range   Phosphorus 3.6 2.5 - 4.6 mg/dL  Glucose, capillary     Status: Abnormal   Collection Time: 09/01/15  8:00 AM  Result Value Ref Range   Glucose-Capillary 109 (H) 65 - 99 mg/dL  Glucose, capillary     Status: Abnormal   Collection Time: 09/01/15 12:28 PM  Result Value Ref Range   Glucose-Capillary 115 (H) 65 - 99 mg/dL  Glucose, capillary     Status: Abnormal   Collection Time: 09/01/15  3:31 PM  Result Value Ref Range   Glucose-Capillary 110 (H) 65 - 99 mg/dL  Type and screen [place]     Status: None (Preliminary result)   Collection Time: 09/01/15  5:30 PM  Result Value Ref Range   ABO/RH(D) O POS    Antibody Screen NEG    Sample Expiration 09/04/2015    Unit Number [id number]    Blood Component Type RED CELLS,LR    Unit division 00    Status of Unit ISSUED    Transfusion Status OK TO TRANSFUSE    Crossmatch Result Compatible    Unit Number [id number]    Blood Component Type RED CELLS,LR    Unit division 00    Status of Unit ISSUED    Transfusion Status OK TO TRANSFUSE    Crossmatch Result Compatible   Prepare RBC     Status: None   Collection Time: 09/01/15  5:30 PM  Result Value Ref Range   Order Confirmation ORDER PROCESSED BY BLOOD BANK   Glucose, capillary     Status: None   Collection  Time: 09/01/15  7:33 PM  Result Value Ref Range   Glucose-Capillary 95 65 - 99 mg/dL   Comment 1 Notify RN   Glucose, capillary     Status: None   Collection Time: 09/02/15 12:17 AM  Result Value Ref Range   Glucose-Capillary 97 65 - 99 mg/dL   Comment 1 Notify RN   Glucose, capillary     Status: Abnormal   Collection Time: 09/02/15  4:33 AM  Result Value Ref Range   Glucose-Capillary 119 (H) 65 - 99 mg/dL   Comment 1 Notify RN   Heparin level (unfractionated)     Status: Abnormal   Collection Time: 09/02/15  6:00 AM  Result Value Ref Range   Heparin Unfractionated 0.29 (L) 0.30 - 0.70 IU/mL    Comment:        IF HEPARIN RESULTS ARE BELOW EXPECTED VALUES, AND PATIENT DOSAGE HAS BEEN CONFIRMED, SUGGEST FOLLOW UP TESTING OF ANTITHROMBIN III LEVELS.   Basic metabolic panel     Status: Abnormal   Collection Time: 09/02/15  6:00 AM  Result Value Ref Range   Sodium 131 (L) 135 - 145 mmol/L   Potassium 3.8 3.5 - 5.1 mmol/L   Chloride 102 101 - 111 mmol/L   CO2 22 22 - 32 mmol/L   Glucose, Bld 113 (H) 65 - 99 mg/dL   BUN 10 6 - 20 mg/dL   Creatinine, Ser 0.56 (L) 0.61 - 1.24 mg/dL   Calcium 7.8 (L) 8.9 - 10.3 mg/dL   GFR calc non Af Amer >60 >60 mL/min   GFR calc Af Amer >60 >60 mL/min    Comment: (NOTE) The eGFR has been  calculated using the CKD EPI equation. This calculation has not been validated in all clinical situations. eGFR's persistently <60 mL/min signify possible Chronic Kidney Disease.    Anion gap 7 5 - 15  Phosphorus     Status: None   Collection Time: 09/02/15  6:00 AM  Result Value Ref Range   Phosphorus 3.7 2.5 - 4.6 mg/dL  Magnesium     Status: Abnormal   Collection Time: 09/02/15  6:00 AM  Result Value Ref Range   Magnesium 1.5 (L) 1.7 - 2.4 mg/dL  CBC     Status: Abnormal (Preliminary result)   Collection Time: 09/02/15  6:00 AM  Result Value Ref Range   WBC 16.3 (H) 4.0 - 10.5 K/uL   RBC 3.28 (L) 4.22 - 5.81 MIL/uL   Hemoglobin 9.3 (L) 13.0 -  17.0 g/dL    Comment: REPEATED TO VERIFY DELTA CHECK NOTED POST TRANSFUSION SPECIMEN    HCT 28.2 (L) 39.0 - 52.0 %   MCV 86.0 78.0 - 100.0 fL   MCH 28.4 26.0 - 34.0 pg   MCHC 33.0 30.0 - 36.0 g/dL   RDW 15.7 (H) 11.5 - 15.5 %   Platelets PENDING 150 - 400 K/uL    Imaging / Studies: No results found.  Medications / Allergies: per chart  Antibiotics: Anti-infectives    Start     Dose/Rate Route Frequency Ordered Stop   08/19/2015 0815  ceFAZolin (ANCEF) IVPB 2 g/50 mL premix     2 g 100 mL/hr over 30 Minutes Intravenous  Once 08/25/2015 0804 09/07/2015 1145   08/13/2015 0815  metroNIDAZOLE (FLAGYL) IVPB 500 mg    Comments:  OR holding area   500 mg 100 mL/hr over 60 Minutes Intravenous  Once 08/17/2015 0804 08/27/2015 0919        Note: Portions of this report may have been transcribed using voice recognition software. Every effort was made to ensure accuracy; however, inadvertent computerized transcription errors may be present.   Any transcriptional errors that result from this process are unintentional.     Adin Hector, M.D., F.A.C.S. Gastrointestinal and Minimally Invasive Surgery [facility] [address] [place] [address] [place], [place] [address] [phone] Main / Paging   09/02/2015  CARE TEAM:  PCP: No PCP Per Patient  Outpatient Care Team: Patient Care Team: No Pcp Per Patient as PCP - General (General Practice)  Inpatient Treatment Team: Treatment Team: Attending Provider: Kinnie Feil, MD; Consulting Physician: Nolon Nations, MD; Registered Nurse: Nadara Mode, RN; Registered Nurse: Fransisco Hertz, RN; Technician: Roger Shelter, NT; Social Worker: Ross Ludwig, Latanya Presser; Registered Nurse: Roselind Rily, RN

## 2015-09-02 NOTE — Consult Note (Signed)
WOC ostomy follow up Stoma type/location: RLQ, ileostomy with mucous fistula just adjacent to the os of the functional stoma.   Stomal assessment/size: just a bit greater than 1 3/8", budded, with some mucosal sloughing a stoma edges.  Peristomal assessment: intact  Treatment options for stomal/peristomal skin: NA Output liquid, brown with flecks Ostomy pouching: 2pc. 2 3/4" used today Education provided:  Demonstrated pouch change to the patient today, he did cover his eyes at one point and seems a little upset with seeing the stoma for the first time. But he was ok shortly thereafter.  He was able to close and open lock and roll closure. Reviewed risk for dehydration and need to increase fluid intake as much as possible.  Answered questions for patient.  Will continue to support ostomy teaching with patient.  Enrolled patient in [employer] Discharge program: Yes  WOC will follow along with you for continued support with ostomy teaching and care [facility] RN,CWOCN [id number]

## 2015-09-02 NOTE — Progress Notes (Signed)
Daily Progress Note   Patient Name: Jesse Murray       Date: 09/02/2015 DOB: Mar 08, 1961  Age: 54 y.o. MRN#: [id number] Attending Physician: Kinnie Feil, MD Primary Care Physician: No PCP Per Patient Admit Date: 08/16/2015  Reason for Consultation/Follow-up: Establishing goals of care, Non pain symptom management, Pain control and Psychosocial/spiritual support  Subjective:  -continued discussion with [name] regarding his current medical situation.  We discussed his overall poor prognosis and importance of consideration and self-decsion making regarding his advanced directives.   He is very irritable today and frustrated with any conversation regarding next transition of care.  He feels "pushed out".  I sat quietly with patient and assured him I would support him and help him navigate this difficult situation.    He is hopeful that once his sister/Amy gets to [facility] ( she is coming on Monday the 26th), she will be a great support and will help him make "the important decisions".     Length of Stay: 7 days  Current Medications: Scheduled Meds:  . sodium chloride   Intravenous Once  . acetaminophen  1,000 mg Oral TID  . feeding supplement (ENSURE ENLIVE)  237 mL Oral BID BM  . lip balm   Topical BID  . oxyCODONE  40 mg Oral Q12H  . sodium chloride  10-40 mL Intracatheter Q12H    Continuous Infusions: . dextrose 5 % and 0.9 % NaCl with KCl 20 mEq/L 10 mL/hr at 08/30/15 1210  . Marland KitchenTPN (CLINIMIX-E) Adult 40 mL/hr at 09/02/15 1234   And  . fat emulsion 240 mL (09/01/15 1717)  . heparin 2,250 Units/hr (09/02/15 0823)    PRN Meds: alum & mag hydroxide-simeth, bismuth subsalicylate, HYDROmorphone (DILAUDID) injection, loperamide, LORazepam, magic mouthwash,  menthol-cetylpyridinium, methocarbamol (ROBAXIN)  IV, oxyCODONE, phenol, sodium chloride  Physical Exam: Physical Exam  Constitutional: He appears cachectic. He appears ill.  HENT:  Mouth/Throat: Mucous membranes are normal. No oropharyngeal exudate.  Cardiovascular: Normal rate, regular rhythm and normal heart sounds.   Pulmonary/Chest: He has decreased breath sounds in the right lower field and the left lower field.  Abdominal: There is generalized tenderness.  - noted iliostomy  Skin:  Warm and dry                Vital Signs: BP 111/66  mmHg  Pulse 96  Temp(Src) 97.8 F (36.6 C) (Oral)  Resp 20  Ht 5\' 8"  (1.727 m)  Wt 72.802 kg (160 lb 8 oz)  BMI 24.41 kg/m2  SpO2 99% SpO2: SpO2: 99 % O2 Device: O2 Device: Not Delivered O2 Flow Rate: O2 Flow Rate (L/min): 0 L/min  Intake/output summary:   Intake/Output Summary (Last 24 hours) at 09/02/15 1457 Last data filed at 09/02/15 1444  Gross per 24 hour  Intake   1040 ml  Output   2320 ml  Net  -1280 ml   LBM: Last BM Date: 09/01/15 Baseline Weight: Weight: 68.04 kg (150 lb) Most recent weight: Weight: 72.802 kg (160 lb 8 oz)       Palliative Assessment/Data: Flowsheet Rows        Most Recent Value   Intake Tab    Referral Department  -- [Family Medicine]   Unit at Time of Referral  Med/Surg Unit   Palliative Care Primary Diagnosis  Cancer   Date Notified  08/19/2015   Palliative Care Type  New Palliative care   Reason for referral  Clarify Goals of Care   Date of Admission  09/07/2015   Date first seen by Palliative Care  08/25/2015   # of days Palliative referral response time  0 Day(s)   # of days IP prior to Palliative referral  0   Clinical Assessment    Psychosocial & Spiritual Assessment    Palliative Care Outcomes       Additional Data Reviewed: CBC    Component Value Date/Time   WBC 16.3* 09/02/2015 0600   WBC 11.1* 07/14/2015 1745   RBC 3.28* 09/02/2015 0600   RBC 4.22* 07/14/2015 1745   HGB 9.3*  09/02/2015 0600   HGB 12.3* 07/14/2015 1745   HCT 28.2* 09/02/2015 0600   HCT 36.5* 07/14/2015 1745   PLT 98* 09/02/2015 0600   MCV 86.0 09/02/2015 0600   MCV 86.3 07/14/2015 1745   MCH 28.4 09/02/2015 0600   MCH 29.2 07/14/2015 1745   MCHC 33.0 09/02/2015 0600   MCHC 33.8 07/14/2015 1745   RDW 15.7* 09/02/2015 0600   LYMPHSABS 1.6 08/31/2015 0441   MONOABS 0.5 08/31/2015 0441   EOSABS 0.4 08/31/2015 0441   BASOSABS 0.1 08/31/2015 0441    CMP     Component Value Date/Time   NA 131* 09/02/2015 0600   K 3.8 09/02/2015 0600   CL 102 09/02/2015 0600   CO2 22 09/02/2015 0600   GLUCOSE 113* 09/02/2015 0600   BUN 10 09/02/2015 0600   CREATININE 0.56* 09/02/2015 0600   CREATININE 0.69* 07/14/2015 1732   CALCIUM 7.8* 09/02/2015 0600   PROT 4.8* 08/31/2015 0441   ALBUMIN 1.5* 08/31/2015 0441   AST 26 08/31/2015 0441   ALT 15* 08/31/2015 0441   ALKPHOS 425* 08/31/2015 0441   BILITOT 1.6* 08/31/2015 0441   GFRNONAA >60 09/02/2015 0600   GFRAA >60 09/02/2015 0600       Problem List:  Patient Active Problem List   Diagnosis Date Noted  . Ileostomy in place [facility]) 09/02/2015  . Protein-calorie malnutrition, severe ([place]) 09/02/2015  . 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 SBO (small bowel obstruction) from colon cancer s/p colectomy/ileostomy 08/29/2015 09/02/2015  . Bilateral leg edema   . Malignant neoplasm of hepatic flexure ([place])   . Metastatic cancer ([place])   . Metastatic colon cancer to liver ([place]) 09/01/2015  . Leg pain, bilateral   . Absolute anemia   . Acute deep vein  thrombosis (DVT) of proximal vein of lower extremity ([place])   . Palliative care encounter 08/27/2015  . DNR (do not resuscitate) discussion 08/27/2015  . Pain, cancer 08/27/2015  . 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 Abdominal pain 09/10/2015     Palliative Care Assessment & Plan    1.Code Status:  Full code-encouraged to consider DNR status knowing  outcomes in like patients .    Code Status Orders        Start     Ordered   09/12/2015 1500  Full code   Continuous     08/18/2015 1459       2. Goals of Care/Additional Recommendations:  Continue current treatment plan, patient is hopeful for improvement and the possibility of treatment for his cancer, he awaits biopsy reports.   Desire for further Chaplaincy support:no-strong community church support  Psycho-social Needs: Education on Hospice and Medicaid/Financial Assistance  3. Symptom Management:       1.  Pain: Discussed with Family Medicine pain management strategies.  We will calculate 24 hour usage in the morning and anticipate initiatation of  long acting agents.  Patient will need to be on oral agents for discharge.  4. Palliative Prophylaxis:    Aspiration, Bowel Regimen, Delirium Protocol, Frequent Pain Assessment, Oral Care and Turn Reposition  5. Prognosis:  Likely less than six months.  I worry that Octavia Bruckner will be unable to ralley after this surgery and get strong enough to tolerate palliative chemotherapy.  Nutrition will be crititcal and his current albumin is 1.5.  TPN complicates disposition options.  He has very little social support, lives alone, his sister plans to come from out of town on Monday.   6. Discharge Planning:  Pending   Care plan was discussed with Family Medicine  Thank you for allowing the Palliative Medicine Team to assist in the care of this patient.   Time In: 1130 Time Out: 1205 Total Time 35 min Prolonged Time Billed  no         Knox Royalty, NP  09/02/2015, 2:57 PM  Please contact Palliative Medicine Team phone at [phone] for questions and concerns.

## 2015-09-02 NOTE — Clinical Social Work Note (Signed)
Clinical Social Work Assessment  Patient Details  Name: Jesse Murray MRN: [id number] Date of Birth: 02-01-1961  Date of referral:  09/01/15               Reason for consult:  Facility Placement                Permission sought to share information with:  Facility Art therapist granted to share information::  Yes, Verbal Permission Granted  Name::        Agency::  SNF admissions  Relationship::     Contact Information:     Housing/Transportation Living arrangements for the past 2 months:  Single Family Home Source of Information:  Patient Patient Interpreter Needed:  None Criminal Activity/Legal Involvement Pertinent to Current Situation/Hospitalization:  No - Comment as needed Significant Relationships:  Siblings Lives with:  Self Do you feel safe going back to the place where you live?  Yes Need for family participation in patient care:  No (Coment)  Care giving concerns:  Patient is aware that he needs some short term rehab before he can return back home.   Social Worker assessment / plan:  Patient is a 54 year old male who is alert and oriented x4.  Patient has newly been diagnosed with colon cancer and lives alone.  Patient expressed sadness about having the cancer, but is staying postive while talking about the treatment options.  Patient expressed that he was working but then he injured his foot and the company fired him.  PT saw patient are recommending SNF for short term rehab, CSW explained to patient about the process of trying to find a SNF and what to expect.  CSW informed patient that since he does not have any insurance his options are going to be limited on where he can go.  Patient stated he understood, but is willing to go wherever a bed can be found for patient.  Patient stated that his sister will be coming in town next week from [place] and he said she will stay with him for a short period of time.  Patient stated he may start receiving Chemo  depending on what the physicians decide.                                                                                         Employment status:  Unemployed Forensic scientist:  Self Pay (Medicaid Pending) PT Recommendations:  [place] / Referral to community resources:  [place]  Patient/Family's Response to care: Patient  Agreeable to SNF placement for short term rehab.  Patient/Family's Understanding of and Emotional Response to Diagnosis, Current Treatment, and Prognosis:  Patient expressed sadness with current diagnosis and treatment plan, but is trying to stay positive.  Emotional Assessment Appearance:  Appears stated age Attitude/Demeanor/Rapport:    Affect (typically observed):  Appropriate, Calm, Stable, Pleasant Orientation:   Alert and Oriented x4 Alcohol / Substance use:    Psych involvement (Current and /or in the community):     Discharge Needs  Concerns to be addressed:  Discharge Planning Concerns, Lack of Support, Financial / Insurance Concerns Readmission within  the last 30 days:  No Current discharge risk:  Lack of support system, Lives alone, Inadequate Financial Supports Barriers to Discharge:  Inadequate or no insurance, Continued Medical Work up   [facility], [place] 09/02/2015, 8:21 AM

## 2015-09-02 NOTE — Progress Notes (Signed)
Family Medicine Teaching Service Daily Progress Note Intern Pager: [phone]  Patient name: Jesse Murray Medical record number: [id number] Date of birth: 1961-05-23 Age: 54 y.o. Gender: male  Primary Care Provider: No PCP Per Patient Consultants: surgery Code Status: FULL  Patient overview and Major Events to Date: 12/14 CT showing abdominal mass and SBO 12/16 ex-lap with extensive abdominal mets, suspected pancreatic primary non-resectable, ileostomy 12/21: Diet advanced to full; Weaning off TPN; SW working on SNF (Pt wants to stay to Monday until Sister can get here)  Assessment and Plan: TRAVION FRYMIRE is a 54 y.o. male presenting with abdominal pain, as well as bilateral leg swelling and pain. PMH is significant for EtOH abuse.   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 Ex-lap 12/16 found primary to be most likely pancreatic that is invading hepatic flexure; unresectable but ileostomy was done to help alleviate symptoms.   - Pain control: Oxy IR 5-10mg  q4hrs prn; Dilaudid 1-2mg  IV q2hrs prn - Per surgery, continue robaxin - Palliative care continuing to follow pt - appreciate recs  - Nutrition consult for malnourishment - Weaning off TPN; Full Diet started 12/21 - PT/OT consulted - Oncology consulted, appreciate recommendations - Wound care following   #Leg pain/swelling likely 2/2 bilateral DVTs: Most likely DVT due to hypercoagulable state from malignancy and/or recent inactivity after lying in bed for three weeks. Well's criteria for DVT 7 (high risk for DVT). Well's criteria for PE 5.5 (moderate risk). Physical exam also consistent with DVT. Patient denies difficulty breathing. Both legs still markedly edematous - Depending on clot burden patient may be a candidate for IVC filter -  Consider CTA chest if difficulty breathing to rule out PE - Receiving heparin gtt for presumed DVT, Need to transition to Lovenox vs DOAC - Pt considering options  #Anemia: Hgb 9.7 on admission (12.3 one month ago). No signs of bleeding, so potentially just hemodilution as all cell lines have decreased. Also potentially related to malignancy. FOBT negative. Vitals are stable. PT elevated at 32, APTT elevated at 81. INR improved to 1.3.  - Hgb 9.3 (12/21) after 2u pRBCs; Was 7.2 (12/20).  - Daily H&H - Transfusion threshold <7  Thrombocytopenia - Plts 98 (12/21) down from 121 (12/20); 185 (admit, 12/14) - 4T score = 1; unlikely HIT - Continue to monitor while on Therapeutic Heparin  - Need to transition to Lovenox vs DOAC  #AKI: Resolved. Unsure of baseline Cr but one month ago was 0.69. On admission 1.31. Patient did receive IV contrast in ED for CT scan. Also with decreased PO intake likely a prerenal etiology as well. BUN/Cr ratio >20. Cr improved to 0.64 (12/20).  - Avoid nephrotoxic agents including contrast - I/Os  Social:  - SW Consulted: Working on SNF placement - Patient feels " he is getting kicked out of the hospital" wants sister to be here to help him make decisions.   FEN/GI: TPN weaning off, Red Diet, Protonix IV, D5NS + KCl @10mL /hr Prophylaxis:  heparin gtt - Considering DOAC vs Lovenox  Subjective:  Reports pain well controlled on current regimen and tolerating food (panckes for breakfast). He is concerned that people are "kicking him out of the hospital" and wants to wait until his sister gets here Monday (12/26). Considering going home with sister's help; "Monday I should be able to  walk out of here."   Objective: Temp:  [98.2 F (36.8 C)-99 F (37.2 C)] 98.8 F (37.1 C) (12/21 0442) Pulse Rate:  [99-108] 100 (12/21 0442) Resp:  [16-23] 22 (12/21 0828) BP: (103-118)/(44-73) 115/67 mmHg (12/21 0442) SpO2:  [93 %-99 %] 96 % (12/21 0828) FiO2 (%):  [99 %] 99 %  (12/20 2000) Physical Exam: General: lying in bed in NAD Cardiovascular: RRR, no murmurs appreciated Respiratory: CTAB, no wheezing, no increased WOB Abdomen: clean dressing over midline incision, right sided ileostomy with good output Extremities: 1+ pitting edema to knee on RLE with 3+ pitting edema of foot to ankle; 3+ edema of L foot to ankle, 1+ pitting edema to knee of LLE, both LE tender to palpation  Laboratory:  Recent Labs Lab 08/31/15 0441 08/31/15 1606 09/01/15 0538 09/02/15 0600  WBC 10.8*  --  12.7* 16.3*  HGB 7.0* 8.6* 7.2* 9.3*  HCT 22.7* 27.8* 22.7* 28.2*  PLT 132*  --  121* 98*    Recent Labs Lab 08/27/15 1149  08/30/15 0447 08/31/15 0441 09/01/15 0538 09/02/15 0600  NA  --   < > 142 139 134* 131*  K  --   < > 3.7 3.3* 3.6 3.8  CL  --   < > 115* 111 106 102  CO2  --   < > 22 23 22 22   BUN  --   < > 12 8 9 10   CREATININE  --   < > 0.81 0.70 0.64 0.56*  CALCIUM  --   < > 7.3* 7.6* 7.8* 7.8*  PROT 5.3*  --  4.6* 4.8*  --   --   BILITOT 1.6*  --  1.3* 1.6*  --   --   ALKPHOS 702*  --  387* 425*  --   --   ALT 22  --  18 15*  --   --   AST 35  --  30 26  --   --   GLUCOSE  --   < > 126* 123* 120* 113*  < > = values in this interval not displayed.  Imaging/Diagnostic Tests: None new   Olam Idler, MD 09/02/2015, 12:19 PM PGY-3, [place] Intern pager: [phone], text pages welcome

## 2015-09-02 NOTE — Telephone Encounter (Signed)
Spoke with pt's nurse in ref to [place]. F/u appt on 09/17/15@9 :30

## 2015-09-02 NOTE — Progress Notes (Signed)
Family Medicine Teaching Service Daily Progress Note Intern Pager: [phone]  Patient name: Jesse Murray Medical record number: [id number] Date of birth: 1960-09-28 Age: 54 y.o. Gender: male  Primary Care Provider: No PCP Per Patient Consultants: surgery Code Status: FULL  Patient overview and Major Events to Date: 12/14 CT showing abdominal mass and SBO 12/16 Ex-lap with extensive abdominal mets, suspected pancreatic primary non-resectable, ileostomy 12/21: Diet advanced to full; weaning off TPN  Assessment and Plan: Jesse Murray is a 54 y.o. male presenting with abdominal pain, as well as bilateral leg swelling and pain. PMH is significant for EtOH abuse.   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 Ex-lap 12/16 found primary to be most likely pancreatic that is invading hepatic flexure; unresectable but ileostomy was done to help alleviate symptoms.   - Pain control: Oxy IR 5-10mg  q4hrs prn; Dilaudid 1-2mg  IV q2hrs prn - Per surgery, continue robaxin - Palliative care continuing to follow pt - appreciate recs  - Nutrition consult for malnourishment - Weaning off TPN; Full Diet started 12/21 - PT/OT consulted - Oncology consulted, appreciate recommendations - Wound care following   #Leg pain/swelling likely 2/2 bilateral DVTs: Most likely DVT due to hypercoagulable state from malignancy and/or recent inactivity after lying in bed for three weeks. Well's criteria for DVT 7 (high risk for DVT). Well's criteria for PE 5.5 (moderate risk). Physical exam also consistent with DVT. Patient denies difficulty breathing. Both legs still markedly edematous - Consider CTA chest if difficulty breathing to rule out PE - Receiving heparin gtt for presumed DVT, Need to transition to Lovenox vs  DOAC  #Anemia: Hgb 9.7 on admission (12.3 one month ago). No signs of bleeding, so potentially just hemodilution as all cell lines have decreased. Also potentially related to malignancy. FOBT negative. Vitals are stable. PT elevated at 32, APTT elevated at 81. INR improved to 1.3. Hgb 9.3 (12/21) after 2u pRBCs. Stable at 15.8 (12/22).   - Daily H&H - Transfusion threshold <7  Thrombocytopenia: Plts 96 (12/22) down from 98 (12/21); 185 (admit, 12/14). 4T score = 1; unlikely HIT - Continue to monitor while on Therapeutic Heparin  - Need to transition to Lovenox vs DOAC  AKI: Resolved. Unsure of baseline Cr but one month ago was 0.69. On admission 1.31. Patient did receive IV contrast in ED for CT scan. Also with decreased PO intake likely a prerenal etiology as well. BUN/Cr ratio >20. Cr improved to 0.64 (12/20).  - Avoid nephrotoxic agents including contrast - I/Os  Social: Patient feels " he is getting kicked out of the hospital" wants sister to be here to help him make decisions.  - SW Consulted: Working on SNF placement  FEN/GI: TPN weaning off, Reg Diet, Protonix IV, D5NS + KCl @10mL /hr Prophylaxis:  heparin gtt - Considering DOAC vs Lovenox  Subjective:  Patient reports well-controlled pain, though still has some pain in his legs and abdomen. Did not want to discuss Lovenox vs Eliquis this AM, and asked me to leave him alone and come back later. Reported that he had good PO intake yesterday and ate most of his meals. Denied further complaints.   Objective: Temp:  [87.5 F (30.8 C)-98.2 F (36.8 C)] 98.2 F (36.8 C) (12/22 0529) Pulse Rate:  [69-99] 98 (12/22 0529) Resp:  [20-22] 20 (12/22 0529) BP: (  89-115)/(59-72) 89/59 mmHg (12/22 0529) SpO2:  [91 %-99 %] 91 % (12/22 0529) Weight:  [192 lb 1.6 oz (87.136 kg)] 192 lb 1.6 oz (87.136 kg) (12/22 0529) Physical Exam: General: lying in bed in NAD Cardiovascular: RRR, no murmurs appreciated Respiratory: CTAB, no wheezing, no  increased WOB Abdomen: clean dressing over midline incision, right sided ileostomy with good output Extremities: 1+ pitting edema to knee on RLE with 3+ pitting edema of foot to ankle; 3+ edema of L foot to ankle, 1+ pitting edema to knee of LLE, both LE tender to palpation  Laboratory:  Recent Labs Lab 09/01/15 0538 09/02/15 0600 09/03/15 0500  WBC 12.7* 16.3* 15.8*  HGB 7.2* 9.3* 8.8*  HCT 22.7* 28.2* 26.4*  PLT 121* 98* 96*    Recent Labs Lab 08/27/15 1149  08/30/15 0447 08/31/15 0441 09/01/15 0538 09/02/15 0600  NA  --   < > 142 139 134* 131*  K  --   < > 3.7 3.3* 3.6 3.8  CL  --   < > 115* 111 106 102  CO2  --   < > 22 23 22 22   BUN  --   < > 12 8 9 10   CREATININE  --   < > 0.81 0.70 0.64 0.56*  CALCIUM  --   < > 7.3* 7.6* 7.8* 7.8*  PROT 5.3*  --  4.6* 4.8*  --   --   BILITOT 1.6*  --  1.3* 1.6*  --   --   ALKPHOS 702*  --  387* 425*  --   --   ALT 22  --  18 15*  --   --   AST 35  --  30 26  --   --   GLUCOSE  --   < > 126* 123* 120* 113*  < > = values in this interval not displayed.  Imaging/Diagnostic Tests: None new   Verner Mould, MD 09/03/2015, 8:17 AM PGY-1, [place] Intern pager: [phone], text pages welcome

## 2015-09-02 NOTE — NC FL2 (Signed)
[place] LEVEL OF CARE SCREENING TOOL     IDENTIFICATION  Patient Name: Jesse Murray Birthdate: October 21, 1960 Sex: male Admission Date (Current Location): 08/25/2015  [facility] and [place] Number:  Herbalist and Address:  [facility], [place] [address], [place], [place] [address]      Provider Number: [id number]  Attending Physician Name and Address:  Kinnie Feil, MD  Relative Name and Phone Number:       Current Level of Care: Hospital Recommended Level of Care: [place] Prior Approval Number:    Date Approved/Denied:   PASRR Number: [id number]  Discharge Plan: SNF    Current Diagnoses: Patient Active Problem List   Diagnosis Date Noted  . Ileostomy in place [facility]) 09/02/2015  . Protein-calorie malnutrition, severe ([place]) 09/02/2015  . 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 SBO (small bowel obstruction) from colon cancer s/p colectomy/ileostomy 08/29/2015 09/02/2015  . Bilateral leg edema   . Malignant neoplasm of hepatic flexure ([place])   . Metastatic cancer ([place])   . Metastatic colon cancer to liver ([place]) 09/01/2015  . Leg pain, bilateral   . Absolute anemia   . Acute deep vein thrombosis (DVT) of proximal vein of lower extremity (HCC)   . Palliative care encounter 08/27/2015  . DNR (do not resuscitate) discussion 08/27/2015  . Pain, cancer 08/27/2015  . 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 Abdominal pain 08/31/2015    Orientation RESPIRATION BLADDER Height & Weight    Self, Time, Situation, Place  Normal Continent 5\' 8"  (172.7 cm) 160 lbs.  BEHAVIORAL SYMPTOMS/MOOD NEUROLOGICAL BOWEL NUTRITION STATUS      Colostomy TNA, Diet  AMBULATORY STATUS COMMUNICATION OF NEEDS Skin   Limited Assist Verbally Surgical wounds                       Personal Care Assistance Level of Assistance  Bathing, Dressing Bathing Assistance: Limited assistance   Dressing  Assistance: Limited assistance     Functional Limitations Info             SPECIAL CARE FACTORS FREQUENCY  PT (By licensed PT)     PT Frequency: 5x              Contractures      Additional Factors Info  Allergies, Code Status Code Status Info: Full Code Allergies Info: NKA           Current Medications (09/02/2015):  This is the current hospital active medication list Current Facility-Administered Medications  Medication Dose Route Frequency Provider Last Rate Last Dose  . 0.9 %  sodium chloride infusion   Intravenous Once Katheren Shams, DO      . acetaminophen (TYLENOL) tablet 1,000 mg  1,000 mg Oral TID Michael Boston, MD   1,000 mg at 09/02/15 1440  . alum & mag hydroxide-simeth (MAALOX/MYLANTA) 200-200-20 MG/5ML suspension 30 mL  30 mL Oral Q6H PRN Michael Boston, MD      . bismuth subsalicylate (PEPTO BISMOL) 262 MG/15ML suspension 30 mL  30 mL Oral Q8H PRN Michael Boston, MD      . dextrose 5 % and 0.9 % NaCl with KCl 20 mEq/L infusion   Intravenous Continuous Valeda Malm Rumbarger, RPH 10 mL/hr at 08/30/15 1210    . TPN (CLINIMIX-E) Adult   Intravenous Continuous TPN Priscella Mann, RPH 40 mL/hr at 09/02/15 1234     And  . fat emulsion  20 % infusion 240 mL  240 mL Intravenous Continuous TPN Priscella Mann, RPH 10 mL/hr at 09/01/15 1717 240 mL at 09/01/15 1717  . feeding supplement (ENSURE ENLIVE) (ENSURE ENLIVE) liquid 237 mL  237 mL Oral BID BM Michael Boston, MD   237 mL at 09/02/15 1443  . heparin ADULT infusion 100 units/mL (25000 units/250 mL)  2,250 Units/hr Intravenous Continuous Priscella Mann, RPH 22.5 mL/hr at 09/02/15 0823 2,250 Units/hr at 09/02/15 0823  . HYDROmorphone (DILAUDID) injection 1 mg  1 mg Intravenous Q4H PRN Todd D McDiarmid, MD      . lip balm (BLISTEX) ointment   Topical BID Kinnie Feil, MD      . loperamide (IMODIUM) capsule 2-4 mg  2-4 mg Oral Q8H PRN Michael Boston, MD      . LORazepam (ATIVAN) injection 0.5 mg  0.5 mg Intravenous  Q6H PRN Knox Royalty, NP   0.5 mg at 09/02/15 1333  . magic mouthwash  15 mL Oral QID PRN Michael Boston, MD      . menthol-cetylpyridinium (CEPACOL) lozenge 3 mg  1 lozenge Oral PRN Michael Boston, MD      . methocarbamol (ROBAXIN) 1,000 mg in dextrose 5 % 50 mL IVPB  1,000 mg Intravenous Q8H PRN [name], PA-C   1,000 mg at 08/31/15 2322  . oxyCODONE (Oxy IR/ROXICODONE) immediate release tablet 10 mg  10 mg Oral Q4H PRN Blane Ohara McDiarmid, MD   10 mg at 09/02/15 1440  . oxyCODONE (OXYCONTIN) 12 hr tablet 40 mg  40 mg Oral Q12H Todd D McDiarmid, MD      . phenol (CHLORASEPTIC) mouth spray 1 spray  1 spray Mouth/Throat PRN Johnathan Hausen, MD      . sodium chloride 0.9 % injection 10-40 mL  10-40 mL Intracatheter Q12H Kinnie Feil, MD   10 mL at 09/01/15 0803  . sodium chloride 0.9 % injection 10-40 mL  10-40 mL Intracatheter PRN Kinnie Feil, MD   10 mL at 09/02/15 0603     Discharge Medications: Please see discharge summary for a list of discharge medications.  Relevant Imaging Results:  Relevant Lab Results:   Additional Information Patient is being weened off of TPN and will not discharge on TPN.  SSN is [id number]  Ross Ludwig, LCSWA

## 2015-09-02 NOTE — Progress Notes (Addendum)
[place] NOTE   Pharmacy Consult for TPN; Heparin Indication: SBO, malnutrition in the setting of metastatic ca; Bilateral DVT  No Known Allergies  Patient Measurements: Height: 5' 8" (172.7 cm) Weight: 160 lb 8 oz (72.802 kg) IBW/kg (Calculated) : 68.4  Vital Signs: Temp: 98.8 F (37.1 C) (12/21 0442) Temp Source: Oral (12/21 0442) BP: 115/67 mmHg (12/21 0442) Pulse Rate: 100 (12/21 0442) Intake/Output from previous day: 12/20 0701 - 12/21 0700 In: 1160 [P.O.:240; I.V.:250; Blood:670] Out: 1390 [Urine:740; Stool:650] Intake/Output from this shift:    Labs:  Recent Labs  08/31/15 0441 08/31/15 1606 09/01/15 0538 09/02/15 0600  WBC 10.8*  --  12.7* 16.3*  HGB 7.0* 8.6* 7.2* 9.3*  HCT 22.7* 27.8* 22.7* 28.2*  PLT 132*  --  121* PENDING     Recent Labs  08/31/15 0441 09/01/15 0538  NA 139 134*  K 3.3* 3.6  CL 111 106  CO2 23 22  GLUCOSE 123* 120*  BUN 8 9  CREATININE 0.70 0.64  CALCIUM 7.6* 7.8*  MG 1.6* 1.7  PHOS 2.4* 3.6  PROT 4.8*  --   ALBUMIN 1.5*  --   AST 26  --   ALT 15*  --   ALKPHOS 425*  --   BILITOT 1.6*  --   PREALBUMIN 2.6*  --   TRIG 137  --    Estimated Creatinine Clearance: 103.3 mL/min (by C-G formula based on Cr of 0.64).    Recent Labs  09/01/15 1933 09/02/15 0017 09/02/15 0433  GLUCAP 95 97 119*   Insulin Requirements in the past 24 hours:  0 units SSI  Assessment: 3 yom presented to the hospital with abdominal pain, nausea and vomiting. Found to have metastatic cancer (colon vs pancreatic) with massive cecal dilitation. Pt has had significant weight loss and poor appetite over the past month. Palliative care is involved.   Surgeries/Procedures: 12/16 Exlap, partial colectomy, ileostomy and mucus fistula  GI: s/p exlap- ongoing weight loss and poor PO intake for several weeks. Prealbumin dropped further to 2.6. FOB negative, NG tube removed. LBM 12/20. PPI.  Advancing diet to  soft and weaning off TPN. 45-50% intake recorded. Palliative care on board.  Endo: No hx DM - has not required insulin, discontinued Lytes: K 3.8, phos 3.7, mag 1.5, CorCa ~9.8 Renal: SCr 0.56, UOP 0.9- D5NS + KCl 42mq at [place] [facility]/RA- required O2 overnight.  Cards: BP remains on softer end, HR nml-tachy Hepatobil: Alk phos rising at 425, Tbili up to 1.6 - possibly related to liver mets. Albumin low. Trigs remain normal at 137 Neuro: Hx EtOH abuse - Dilaudid PCA >> plan to d/c today and start po - pain scores 0-8 ID: Afebrile, WBC 12.7, no abx   AC/Heme: Heparin for bilateral DVT - level slightly reduced this AM. Hgb 7.2, plts 121- ABL anemia-transfused 12/20; no overt bleeding noted.  Best Practices: SCDs/IV heparin  TPN Access: PICC placed 12/17 TPN start date: 12/17>>12/21  Current Nutrition:  Clinimix E 5/15 @ 837mhr + lipids 20% at 1021mr - provides 100g protein (100% goal) and 1894 Kcal (95% goal) Clear Liquid Diet- 45% intake of dinner last night  Nutritional Goals:  Per RD 12/17: 2000-2200 kCal/day 95-110 grams of protein per day   Plan:  - Weaning TPN to off per surgery note - Decrease Clinimix E5/15 to 40 ml/hr + lipids 20% at 58m1m until 1800 and then discontinue. - MVI + TE daily in TPN - Continue D5NS  with 20KCl at [facility] - Magnesium 2 gram IV today for low level - Discontinue TPN labs, orders and consult.    -Increase heparin to 2250 units/hr -Daily HL and CBC -follow-up plan for oral anticoagulation  Sloan Leiter, PharmD, BCPS Clinical Pharmacist [phone] 09/02/2015 7:16 AM

## 2015-09-03 LAB — CBC
HCT: 26.4 % — ABNORMAL LOW (ref 39.0–52.0)
HEMOGLOBIN: 8.8 g/dL — AB (ref 13.0–17.0)
MCH: 28.9 pg (ref 26.0–34.0)
MCHC: 33.3 g/dL (ref 30.0–36.0)
MCV: 86.8 fL (ref 78.0–100.0)
PLATELETS: 96 10*3/uL — AB (ref 150–400)
RBC: 3.04 MIL/uL — ABNORMAL LOW (ref 4.22–5.81)
RDW: 16.3 % — AB (ref 11.5–15.5)
WBC: 15.8 10*3/uL — ABNORMAL HIGH (ref 4.0–10.5)

## 2015-09-03 LAB — HEPARIN LEVEL (UNFRACTIONATED): HEPARIN UNFRACTIONATED: 0.26 [IU]/mL — AB (ref 0.30–0.70)

## 2015-09-03 MED ORDER — LORAZEPAM 0.5 MG PO TABS
0.5000 mg | ORAL_TABLET | Freq: Four times a day (QID) | ORAL | Status: DC | PRN
Start: 2015-09-03 — End: 2015-09-06
  Administered 2015-09-04 – 2015-09-06 (×5): 0.5 mg via ORAL
  Filled 2015-09-03 (×5): qty 1

## 2015-09-03 MED ORDER — OXYCODONE HCL ER 15 MG PO T12A
60.0000 mg | EXTENDED_RELEASE_TABLET | Freq: Two times a day (BID) | ORAL | Status: DC
  Administered 2015-09-03 – 2015-09-04 (×4): 60 mg via ORAL
  Filled 2015-09-03 (×5): qty 4

## 2015-09-03 MED ORDER — RIVAROXABAN 15 MG PO TABS
15.0000 mg | ORAL_TABLET | Freq: Two times a day (BID) | ORAL | Status: DC
  Administered 2015-09-03 – 2015-09-06 (×7): 15 mg via ORAL
  Filled 2015-09-03 (×7): qty 1

## 2015-09-03 MED ORDER — KCL IN DEXTROSE-NACL 20-5-0.9 MEQ/L-%-% IV SOLN
INTRAVENOUS | Status: DC
Start: 2015-09-03 — End: 2015-09-04
  Administered 2015-09-03: 18:00:00 via INTRAVENOUS
  Filled 2015-09-03 (×5): qty 1000

## 2015-09-03 NOTE — Clinical Social Work Note (Signed)
CSW met with patient to discuss SNF search process and updated patient that he does not have a bed available yet.  Patient stated he would rather not go to a SNF for short term rehab, he said that he feels once the clots are cleared up then he feels he can go home.  Patient expressed that his sister will be in town on Monday, and she said she can stay with him for 2 or 3 years if he needs her to.  Patient expressed sadness of having been diagnosed with cancer, and stated he can only take it one day at a time.  Patient states he is hopeful he can get strong again in order to participate in chemo therapy.  CSW to continue to follow patient's progress.  Jesse Murray. [place], MSW, [place] 09/03/2015 3:10 PM

## 2015-09-03 NOTE — Progress Notes (Signed)
Family Medicine Teaching Service Daily Progress Note Intern Pager: [phone]  Patient name: Jesse Murray Medical record number: [id number] Date of birth: September 11, 1961 Age: 54 y.o. Gender: male  Primary Care Provider: No PCP Per Patient Consultants: surgery Code Status: FULL  Patient overview and Major Events to Date: 12/14 CT showing abdominal mass and SBO 12/16 Ex-lap with extensive abdominal mets, suspected pancreatic primary non-resectable, ileostomy 12/21: Diet advanced to full; weaning off TPN 12/22: Weaned off TPN; transitioned from heparin to Xarelto  Assessment and Plan: Jesse Murray is a 54 y.o. male presenting with abdominal pain, as well as bilateral leg swelling and pain. PMH is significant for EtOH abuse.   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 Ex-lap 12/16 found primary to be most likely pancreatic that is invading hepatic flexure; unresectable but ileostomy was done to help alleviate symptoms.   - Pain control: Oxy IR 5-10mg  q4hrs prn, oxycontin 12 hr 60 mg BID - Per surgery, continue robaxin - Palliative care continuing to follow pt - appreciate recs  - Nutrition consult for malnourishment - off TPN, on regular diet now - PT/OT consulted - Wound care following   #Leg pain/swelling likely 2/2 bilateral DVTs: Most likely DVT due to hypercoagulable state from malignancy and/or recent inactivity after lying in bed for three weeks. Well's criteria for DVT 7 (high risk for DVT). Well's criteria for PE 5.5 (moderate risk). Physical exam also consistent with DVT. Patient transitioned from heparin to Xarelto for long-term anticoagulation on 12/22.  - Consider CTA chest if difficulty breathing to rule out PE - Continue Xarelto  #Anemia: Hgb 9.7 on admission (12.3 one month ago).  No signs of bleeding, so potentially just hemodilution as all cell lines have decreased. Also potentially related to malignancy. FOBT negative. Vitals are stable. PT elevated at 32, APTT elevated at 81. INR improved to 1.3. Hgb 9.3 (12/21) after 2u pRBCs. Stable at 8.2 (12/23).   - Daily H&H - Transfusion threshold <7  Thrombocytopenia: Plts 100 (12/23) up from 96 (12/22); 185 on admit (12/14). 4T score = 1; unlikely HIT. Transitioned from heparin to Xarelto 12/22.  - Continue to monitor   AKI: Resolved. Unsure of baseline Cr but one month ago was 0.69. On admission 1.31. Patient did receive IV contrast in ED for CT scan. Also with decreased PO intake likely a prerenal etiology as well. BUN/Cr ratio >20. Cr improved to 0.57 (12/23).  - Avoid nephrotoxic agents including contrast - I/Os  Social: Patient feels " he is getting kicked out of the hospital" wants sister to be here to help him make decisions.  - SW Consulted: Working on SNF placement  FEN/GI: reg diet, KVO Prophylaxis: Xarelto  Subjective:  Patient very irritated this AM that people keep "bothering" him. Endorses some abdominal pain after coughing last night (says he choked on a sip of water). Improved leg pain.  Pt hypotensive late in the day yesterday so received 1L bolus. BP still hypotensive this AM at 90/60 but pt denies symptoms.   Objective: Temp:  [97.4 F (36.3 C)-98.4 F (36.9 C)] 98.4 F (36.9 C) (12/22 2040) Pulse Rate:  [91-94] 91 (12/22 2040) Resp:  [18-19] 19 (12/22 2040) BP: (100-102)/(64-65) 100/64 mmHg (12/22 2040) SpO2:  [97 %-98 %] 98 % (12/22 2040) Physical Exam: General: ill-appearing, cachectic, lying in bed in  NAD Cardiovascular: declined exam, normal work of breathing on RA Respiratory: declined exam Abdomen: declined exam Extremities: 1+ pitting edema to knee on RLE with 3+ pitting edema of foot to ankle; 3+ edema of L foot to ankle, 1+ pitting edema to knee of LLE, both LE tender to  palpation  Laboratory:  Recent Labs Lab 09/02/15 0600 09/03/15 0500 09/04/15 0453  WBC 16.3* 15.8* 12.7*  HGB 9.3* 8.8* 8.2*  HCT 28.2* 26.4* 25.5*  PLT 98* 96* 100*    Recent Labs Lab 08/30/15 0447 08/31/15 0441 09/01/15 0538 09/02/15 0600 09/04/15 0453  NA 142 139 134* 131* 134*  K 3.7 3.3* 3.6 3.8 4.2  CL 115* 111 106 102 103  CO2 22 23 22 22 23   BUN 12 8 9 10 8   CREATININE 0.81 0.70 0.64 0.56* 0.57*  CALCIUM 7.3* 7.6* 7.8* 7.8* 7.8*  PROT 4.6* 4.8*  --   --   --   BILITOT 1.3* 1.6*  --   --   --   ALKPHOS 387* 425*  --   --   --   ALT 18 15*  --   --   --   AST 30 26  --   --   --   GLUCOSE 126* 123* 120* 113* 76    Imaging/Diagnostic Tests: None new   Verner Mould, MD 09/04/2015, 7:49 AM PGY-1, [place] Intern pager: [phone], text pages welcome

## 2015-09-03 NOTE — Progress Notes (Signed)
ANTICOAGULATION CONSULT NOTE - Follow Up Consult  Pharmacy Consult for heparin Indication: DVT  Labs:  Recent Labs  09/01/15 0538 09/02/15 0600 09/03/15 0500  HGB 7.2* 9.3* 8.8*  HCT 22.7* 28.2* 26.4*  PLT 121* 98* 96*  HEPARINUNFRC 0.34 0.29* 0.26*  CREATININE 0.64 0.56*  --      Assessment: 54yo male subtherapeutic on heparin despite rate increase.  Goal of Therapy:  Heparin level 0.3-0.7 units/ml   Plan:  Will increase heparin gtt by 1-2 units/kg/hr to 2400 units/hr and check level in Day, PharmD, BCPS  09/03/2015,6:09 AM

## 2015-09-03 NOTE — Progress Notes (Signed)
Progress Note: General Surgery Service   Subjective: Tolerating diet, pain controlled, ambulating  Objective: Vital signs in last 24 hours: Temp:  [87.5 F (30.8 C)-98.2 F (36.8 C)] 98.2 F (36.8 C) (12/22 0529) Pulse Rate:  [69-99] 98 (12/22 0529) Resp:  [20] 20 (12/22 0529) BP: (89-115)/(59-72) 89/59 mmHg (12/22 0529) SpO2:  [91 %-96 %] 91 % (12/22 0529) Weight:  [87.136 kg (192 lb 1.6 oz)] 87.136 kg (192 lb 1.6 oz) (12/22 0529) Last BM Date: 09/02/15  Intake/Output from previous day: 12/21 0701 - 12/22 0700 In: 829 [P.O.:360; I.V.:469] Out: 3125 [Urine:2325; Stool:800] Intake/Output this shift: Total I/O In: 51 [I.V.:51] Out: -   Lungs: CTAB  Cardiovascular: RRR  Abd: soft, ATTP, ND, wounds c/d/i, ostomy with liquid stool in bag  Neuro: AOx4  Lab Results: CBC   Recent Labs  09/02/15 0600 09/03/15 0500  WBC 16.3* 15.8*  HGB 9.3* 8.8*  HCT 28.2* 26.4*  PLT 98* 96*   BMET  Recent Labs  09/01/15 0538 09/02/15 0600  NA 134* 131*  K 3.6 3.8  CL 106 102  CO2 22 22  GLUCOSE 120* 113*  BUN 9 10  CREATININE 0.64 0.56*  CALCIUM 7.8* 7.8*   PT/INR No results for input(s): LABPROT, INR in the last 72 hours. ABG No results for input(s): PHART, HCO3 in the last 72 hours.  Invalid input(s): PCO2, PO2  Studies/Results:  Anti-infectives: Anti-infectives    Start     Dose/Rate Route Frequency Ordered Stop   09/09/2015 0815  ceFAZolin (ANCEF) IVPB 2 g/50 mL premix     2 g 100 mL/hr over 30 Minutes Intravenous  Once 09/04/2015 0804 09/01/2015 1145   08/27/2015 0815  metroNIDAZOLE (FLAGYL) IVPB 500 mg    Comments:  OR holding area   500 mg 100 mL/hr over 60 Minutes Intravenous  Once 08/29/2015 0804 08/13/2015 0919      Medications: Scheduled Meds: . sodium chloride   Intravenous Once  . acetaminophen  1,000 mg Oral TID  . feeding supplement (ENSURE ENLIVE)  237 mL Oral BID BM  . lip balm   Topical BID  . oxyCODONE  60 mg Oral Q12H  . Rivaroxaban  15 mg  Oral BID WC  . sodium chloride  10-40 mL Intracatheter Q12H   Continuous Infusions: . dextrose 5 % and 0.9 % NaCl with KCl 20 mEq/L 100 mL/hr at 09/03/15 0845   PRN Meds:.alum & mag hydroxide-simeth, bismuth subsalicylate, loperamide, LORazepam, magic mouthwash, menthol-cetylpyridinium, methocarbamol (ROBAXIN)  IV, oxyCODONE, phenol, sodium chloride  Assessment/Plan: Patient Active Problem List   Diagnosis Date Noted  . Ileostomy in place [facility]) 09/02/2015  . Protein-calorie malnutrition, severe ([place]) 09/02/2015  . 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 SBO (small bowel obstruction) from colon cancer s/p colectomy/ileostomy 08/29/2015 09/02/2015  . Bilateral leg edema   . Malignant neoplasm of hepatic flexure ([place])   . Metastatic cancer ([place])   . Metastatic colon cancer to liver ([place]) 09/01/2015  . Leg pain, bilateral   . Absolute anemia   . Acute deep vein thrombosis (DVT) of proximal vein of lower extremity (HCC)   . Palliative care encounter 08/27/2015  . DNR (do not resuscitate) discussion 08/27/2015  . Pain, cancer 08/27/2015  . 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 Abdominal pain 09/04/2015   s/p Procedure(s): EXPLORATORY LAPAROTOMY OSTOMY PARTIAL COLECTOMY ILEOSTOMY 09/11/2015 -tolerating diet, off TPN -ostomy with 822ml out yesterday, will continue to follow for high output  -wound continue maintenance fluids at  this time -continue heparin at this time, will follow HGB, received blood 2 days ago -WBC improved today from yesterday, no signs of infection   LOS: 8 days   Mickeal Skinner, MD Pg# [phone] [facility]

## 2015-09-03 NOTE — Discharge Instructions (Addendum)
[facility]      [facility] °[phone] ° °OPEN ABDOMINAL SURGERY: POST OP INSTRUCTIONS ° °Always review your discharge instruction sheet given to you by the facility where your surgery was performed. ° °IF YOU HAVE DISABILITY OR FAMILY LEAVE FORMS, YOU MUST BRING THEM TO THE OFFICE FOR PROCESSING.  PLEASE DO NOT GIVE THEM TO YOUR DOCTOR. ° °1. A prescription for pain medication may be given to you upon discharge.  Take your pain medication as prescribed, if needed.  If narcotic pain medicine is not needed, then you may take acetaminophen (Tylenol) or ibuprofen (Advil) as needed. °2. Take your usually prescribed medications unless otherwise directed. °3. If you need a refill on your pain medication, please contact your pharmacy. They will contact our office to request authorization.  Prescriptions will not be filled after 5pm or on week-ends. °4. You should follow a light diet the first few days after arrival home, such as soup and crackers, pudding, etc.unless your doctor has advised otherwise. A high-fiber, low fat diet can be resumed as tolerated.   Be sure to include lots of fluids daily. Most patients will experience some swelling and bruising on the chest and neck area.  Ice packs will help.  Swelling and bruising can take several days to resolve °5. Most patients will experience some swelling and bruising in the area of the incision. Ice pack will help. Swelling and bruising can take several days to resolve..  °6. It is common to experience some constipation if taking pain medication after surgery.  Increasing fluid intake and taking a stool softener will usually help or prevent this problem from occurring.  A mild laxative (Milk of Magnesia or Miralax) should be taken according to package directions if there are no bowel movements after 48 hours. °7.  You may have steri-strips (small skin tapes) in place directly over the incision.  These strips should be left on the skin for 7-10 days.  If your  surgeon used skin glue on the incision, you may shower in 24 hours.  The glue will flake off over the next 2-3 weeks.  Any sutures or staples will be removed at the office during your follow-up visit. You may find that a light gauze bandage over your incision may keep your staples from being rubbed or pulled. You may shower and replace the bandage daily. °8. ACTIVITIES:  You may resume regular (light) daily activities beginning the next day--such as daily self-care, walking, climbing stairs--gradually increasing activities as tolerated.  You may have sexual intercourse when it is comfortable.  Refrain from any heavy lifting or straining until approved by your doctor. °a. You may drive when you no longer are taking prescription pain medication, you can comfortably wear a seatbelt, and you can safely maneuver your car and apply brakes °b. Return to Work: ___________________________________ °9. You should see your doctor in the office for a follow-up appointment approximately two weeks after your surgery.  Make sure that you call for this appointment within a day or two after you arrive home to insure a convenient appointment time. °OTHER INSTRUCTIONS:  °_____________________________________________________________ °_____________________________________________________________ ° °WHEN TO CALL YOUR DOCTOR: °1. Fever over 101.0 °2. Inability to urinate °3. Nausea and/or vomiting °4. Extreme swelling or bruising °5. Continued bleeding from incision. °6. Increased pain, redness, or drainage from the incision. °7. Difficulty swallowing or breathing °8. Muscle cramping or spasms. °9. Numbness or tingling in hands or feet or around lips. ° °The clinic staff is available to   answer your questions during regular business hours.  Please dont hesitate to call and ask to speak to one of the nurses if you have concerns.  For further questions, please visit [email or web address]    Ostomy Support Information  Yes,  Theresia Majors heard that people get along just fine with only one of their eyes, or one of their lungs, or one of their kidneys. But you also know that you have only one intestine and only one bladder, and that leaves you feeling awfully empty, both physically and emotionally: You think no other people go around without part of their intestine with the ends of their intestines sticking out through their abdominal walls.  Well, you are wrong! There are nearly three quarters of a million people in the [place] who have an ostomy; people who have had surgery to remove all or part of their colons or bladders. There is even a national association, the [place] [employer] [place] with over 350 local affiliated support groups that are organized by volunteers who provide peer support and counseling. Juan Quam has a toll free telephone num-ber, [phone] and an educational,  interactive website, www.ostomy.org   An ostomy is an opening in the belly (abdominal wall) made by surgery. Ostomates are people who have had this procedure. The opening (stoma) allows the kidney or bowel to discharge waste. An external pouch covers the stoma to collect waste. Pouches are are a simple bag and are odor free. Different companies have disposable or reusable pouches to fit one's lifestyle. An ostomy can either be temporary or permanent.  THERE ARE THREE MAIN TYPES OF OSTOMIES  Colostomy. A colostomy is a surgically created opening in the large intestine (colon).  Ileostomy. An ileostomy is a surgically created opening in the small intestine.  Urostomy. A urostomy is a surgically created opening to divert urine away from the bladder. FREQUENTLY ASKED QUESTIONS   Why havent you met any of these folks who have an ostomy?  Well, maybe you have! You just did not recognize them because an ostomy doesn't show. It can be kept secret if you wish. Why, maybe some of your best friends, office associates or neighbors have an ostomy ... you  never can tell.   People facing ostomy surgery have many quality-of-life questions like:  Will you bulge? Smell? Make noises? Will you feel waste leaving your body? Will you be a captive of the toilet? Will you starve? Be a social outcast? Get/stay married? Have babies? Easily bathe, go swimming, bend over?  OK, lets look at what you can expect:  Will you bulge?  Remember, without part of the intestine or bladder, and its contents, you should have a flatter tummy than before. You can expect to wear, with little exception, what you wore before surgery ... and this in-cludes tight clothing and bathing suits.  Will you smell?  Today, thanks to modern odor proof pouching systems, you can walk into an ostomy support group meeting and not smell anything that is foul or offensive. And, for those with an ileostomy or colostomy who are concerned about odor when emptying their pouch, there are in-pouch deodorants that can be used to eliminate any waste odors that may exist.  Will you make noises?  Everyone produces gas, especially if they are an air-swallower. But intestinal sounds that occur from time to time are no differ-ent than a gurgling tummy, and quite often your clothing will muffle any sounds.   Will you feel the waste discharges?  For those  with a colostomy or ileostomy there might be a slight pressure when waste leaves your body, but understand that the intestines have no nerve endings, so there will be no unpleasant sensations. Those with a urostomy will probably be unaware of any kidney drainage.  Will you be a captive of the toilet?  Immediately post-op you will spend more time in the bathroom than you will after your body recovers from surgery. Every person is different, but on average those with an ileostomy or urostomy may empty their pouches 4 to 6 times a day; a little  less if you have a colostomy. The average wear time between pouch system changes is 3 to 5 days and the changing process  should take less than 30 minutes.  Will I need to be on a special diet? Most people return to their normal diet when they have recovered from surgery. Be sure to chew your food well, eat a well-balanced diet and drink plenty of fluids. If you experience problems with a certain food, wait a couple of weeks and try it again. Will there be odor and noises? Pouching systems are designed to be odor-proof or odor-resistant. There are deodorants that can be used in the pouch. Medications are also available to help reduce odor. Limit gas-producing foods and carbonated beverages. You will experience less gas and fewer noises as you heal from surgery. How much time will it take to care for my ostomy? At first, you may spend a lot of time learning about your ostomy and how to take care of it. As you become more comfortable and skilled at changing the pouching system, it will take very little time to care for it.  Will I be able to return to work? People with ostomies can perform most jobs. As soon as you have healed from surgery, you should be able to return to work. Heavy lifting (more than 10 pounds) may be discouraged.  What about intimacy? Sexual relationships and intimacy are important and fulfilling aspects of your life. They should continue after ostomy surgery. Intimacy-related concerns should be discussed openly between you and your partner.  Can I wear regular clothing? You do not need to wear special clothing. Ostomy pouches are fairly flat and barely noticeable. Elastic undergarments will not hurt the stoma or prevent the ostomy from functioning.  Can I participate in sports? An ostomy should not limit your involvement in sports. Many people with ostomies are runners, skiers, swimmers or participate in other active lifestyles. Talk with your caregiver first before doing heavy physical activity.  Will you starve?  Not if you follow doctors orders at each stage of your post-op adjustment. There is  no such thing as an ostomy diet. Some people with an ostomy will be able to eat and tolerate anything; others may find diffi-culty with some foods. Each person is an individual and must determine, by trial, what is best for them. A good practice for all is to drink plenty of water.  Will you be a social outcast?  Have you met anyone who has an ostomy and is a social outcast? Why should you be the first? Only your attitude and self image will effect how you are treated. No confi-dent person is an Occupational psychologist.   PROFESSIONAL HELP  Resources are available if you need help or have questions about your ostomy.    Specially trained nurses called Wound, Ostomy Continence Nurses (WOCN) are available for consultation in most major medical centers.   Consider getting  an ostomy consult with Cena Benton at [facility] to help troubleshoot stoma pouch fittings and other issues with your ostomy: [phone]   The [employer] Ostomy Association (UOA) is a group made up of many local chapters throughout the [place]. These local groups hold meetings and provide support to prospective and existing ostomates. They sponsor educational events and have qualified visitors to make personal or telephone visits. Contact the UOA for the chapter nearest you and for other educational publications.  More detailed information can be found in Colostomy Guide, a publication of the [employer] (UOA). Contact UOA at 1-(830) 559-9297 or visit their web site at [email or web address]://[email or web address]/. The website contains links to other sites, suppliers and resources. Document Released: 09/01/2003 Document Revised: 11/21/2011 Document Reviewed: 12/31/2008 [facility] [place] Patient Information 2013 [place].   Information on my medicine - XARELTO (rivaroxaban)  This medication education was reviewed with me or my healthcare representative as part of my discharge preparation.  The pharmacist that spoke with me during my hospital stay  was:  Brain Hilts, [place]? Xarelto was prescribed to treat blood clots that may have been found in the veins of your legs (deep vein thrombosis) or in your lungs (pulmonary embolism) and to reduce the risk of them occurring again.  What do you need to know about Xarelto? The starting dose is one 15 mg tablet taken TWICE daily with food for the FIRST 21 DAYS then on 08/25/15  the dose is changed to one 20 mg tablet taken ONCE A DAY with your evening meal.  DO NOT stop taking Xarelto without talking to the health care provider who prescribed the medication.  Refill your prescription for 20 mg tablets before you run out.  After discharge, you should have regular check-up appointments with your healthcare provider that is prescribing your Xarelto.  In the future your dose may need to be changed if your kidney function changes by a significant amount.  What do you do if you miss a dose? If you are taking Xarelto TWICE DAILY and you miss a dose, take it as soon as you remember. You may take two 15 mg tablets (total 30 mg) at the same time then resume your regularly scheduled 15 mg twice daily the next day.  If you are taking Xarelto ONCE DAILY and you miss a dose, take it as soon as you remember on the same day then continue your regularly scheduled once daily regimen the next day. Do not take two doses of Xarelto at the same time.   Important Safety Information Xarelto is a blood thinner medicine that can cause bleeding. You should call your healthcare provider right away if you experience any of the following: ? Bleeding from an injury or your nose that does not stop. ? Unusual colored urine (red or dark brown) or unusual colored stools (red or black). ? Unusual bruising for unknown reasons. ? A serious fall or if you hit your head (even if there is no bleeding).  Some medicines may interact with Xarelto and might increase your risk of bleeding  while on Xarelto. To help avoid this, consult your healthcare provider or pharmacist prior to using any new prescription or non-prescription medications, including herbals, vitamins, non-steroidal anti-inflammatory drugs (NSAIDs) and supplements.  This website has more information on Xarelto: [email or web address]

## 2015-09-03 NOTE — Progress Notes (Signed)
Daily Progress Note   Patient Name: Jesse Murray       Date: 09/03/2015 DOB: 04-22-1961  Age: 54 y.o. MRN#: [id number] Attending Physician: Kinnie Feil, MD Primary Care Physician: No PCP Per Patient Admit Date: 08/15/2015  Reason for Consultation/Follow-up: Establishing goals of care, Non pain symptom management, Pain control and Psychosocial/spiritual support  Subjective:  -Today  I sat quietly with patient and assured him I would support him and help him navigate this difficult situation.  He accepted a back rub and hand massage from me.  He initial irritation dissipated.  He is overwhlmed with the situation and decisions   -at this point in time Jesse Murray remains hopeful for improvement and is open to chemotherapy to treat his cancer.  However to me he is weaker everyday and today is intermittently confused.  He is high risk of decompensation inspite of the current medical interventions   He is hopeful that once his sister/Jesse Murray gets to [facility] ( she is coming on Monday the 26th), she will be a great support.. Attempted to call her today without success   Length of Stay: 8 days  Current Medications: Scheduled Meds:  . sodium chloride   Intravenous Once  . acetaminophen  1,000 mg Oral TID  . feeding supplement (ENSURE ENLIVE)  237 mL Oral BID BM  . lip balm   Topical BID  . oxyCODONE  60 mg Oral Q12H  . Rivaroxaban  15 mg Oral BID WC  . sodium chloride  10-40 mL Intracatheter Q12H    Continuous Infusions: . dextrose 5 % and 0.9 % NaCl with KCl 20 mEq/L      PRN Meds: alum & mag hydroxide-simeth, bismuth subsalicylate, loperamide, LORazepam, magic mouthwash, menthol-cetylpyridinium, methocarbamol (ROBAXIN)  IV, oxyCODONE, phenol, sodium chloride  Physical Exam: Physical  Exam  Constitutional: He appears cachectic. He appears ill.  HENT:  Mouth/Throat: Mucous membranes are normal. No oropharyngeal exudate.  Cardiovascular: Normal rate, regular rhythm and normal heart sounds.   Pulmonary/Chest: He has decreased breath sounds in the right lower field and the left lower field.  Abdominal: There is generalized tenderness.  - noted iliostomy  Skin:  Warm and dry                Vital Signs: BP 89/59 mmHg  Pulse 98  Temp(Src) 98.2 F (36.8 C) (Oral)  Resp 20  Ht 5\' 8"  (1.727 m)  Wt 87.136 kg (192 lb 1.6 oz)  BMI 29.22 kg/m2  SpO2 91% SpO2: SpO2: 91 % O2 Device: O2 Device: Not Delivered O2 Flow Rate: O2 Flow Rate (L/min): 0 L/min  Intake/output summary:   Intake/Output Summary (Last 24 hours) at 09/03/15 0900 Last data filed at 09/03/15 0800  Gross per 24 hour  Intake    760 ml  Output   3025 ml  Net  -2265 ml   LBM: Last BM Date: 09/02/15 Baseline Weight: Weight: 68.04 kg (150 lb) Most recent weight: Weight: 87.136 kg (192 lb 1.6 oz)       Palliative Assessment/Data: Flowsheet Rows        Most Recent Value   Intake Tab    Referral Department  -- [Family Medicine]   Unit at Time of Referral  Med/Surg Unit   Palliative Care Primary Diagnosis  Cancer   Date Notified  09/09/2015   Palliative Care Type  New Palliative care   Reason for referral  Clarify Goals of Care   Date of Admission  08/25/2015   Date first seen by Palliative Care  09/11/2015   # of days Palliative referral response time  0 Day(s)   # of days IP prior to Palliative referral  0   Clinical Assessment    Psychosocial & Spiritual Assessment    Palliative Care Outcomes       Additional Data Reviewed: CBC    Component Value Date/Time   WBC 15.8* 09/03/2015 0500   WBC 11.1* 07/14/2015 1745   RBC 3.04* 09/03/2015 0500   RBC 4.22* 07/14/2015 1745   HGB 8.8* 09/03/2015 0500   HGB 12.3* 07/14/2015 1745   HCT 26.4* 09/03/2015 0500   HCT 36.5* 07/14/2015 1745   PLT 96*  09/03/2015 0500   MCV 86.8 09/03/2015 0500   MCV 86.3 07/14/2015 1745   MCH 28.9 09/03/2015 0500   MCH 29.2 07/14/2015 1745   MCHC 33.3 09/03/2015 0500   MCHC 33.8 07/14/2015 1745   RDW 16.3* 09/03/2015 0500   LYMPHSABS 1.6 08/31/2015 0441   MONOABS 0.5 08/31/2015 0441   EOSABS 0.4 08/31/2015 0441   BASOSABS 0.1 08/31/2015 0441    CMP     Component Value Date/Time   NA 131* 09/02/2015 0600   K 3.8 09/02/2015 0600   CL 102 09/02/2015 0600   CO2 22 09/02/2015 0600   GLUCOSE 113* 09/02/2015 0600   BUN 10 09/02/2015 0600   CREATININE 0.56* 09/02/2015 0600   CREATININE 0.69* 07/14/2015 1732   CALCIUM 7.8* 09/02/2015 0600   PROT 4.8* 08/31/2015 0441   ALBUMIN 1.5* 08/31/2015 0441   AST 26 08/31/2015 0441   ALT 15* 08/31/2015 0441   ALKPHOS 425* 08/31/2015 0441   BILITOT 1.6* 08/31/2015 0441   GFRNONAA >60 09/02/2015 0600   GFRAA >60 09/02/2015 0600       Problem List:  Patient Active Problem List   Diagnosis Date Noted  . Ileostomy in place [facility]) 09/02/2015  . Protein-calorie malnutrition, severe ([place]) 09/02/2015  . 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 SBO (small bowel obstruction) from colon cancer s/p colectomy/ileostomy 08/29/2015 09/02/2015  . Bilateral leg edema   . Malignant neoplasm of hepatic flexure ([place])   . Metastatic cancer ([place])   . 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 Leg pain, bilateral   . Absolute anemia   . Acute deep vein thrombosis (DVT) of proximal  vein of lower extremity ([place])   . Palliative care encounter 08/27/2015  . DNR (do not resuscitate) discussion 08/27/2015  . Pain, cancer 08/27/2015  . 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 Abdominal pain 09/06/2015     Palliative Care Assessment & Plan    1.Code Status:  Full code-encouraged to consider DNR status knowing outcomes in like patients .    Code Status Orders        Start     Ordered   09/12/2015 1500  Full code    Continuous     08/14/2015 1459       2. Goals of Care/Additional Recommendations:  Continue current treatment plan, patient is hopeful for improvement and the possibility of treatment for his cancer  Desire for further Chaplaincy support:no-strong community church support  Psycho-social Needs: Education on Hospice and Medicaid/Financial Assistance  3. Symptom Management:       1.  Pain: Discussed with Family Medicine pain management strategies.  All pain medications are converted to oral agents.  Continue to access for efficacy  4. Palliative Prophylaxis:    Aspiration, Bowel Regimen, Delirium Protocol, Frequent Pain Assessment, Oral Care and Turn Reposition  5. Prognosis:  Likely less than six months.  I worry that Jesse Murray will be unable to ralley after this surgery and get strong enough to tolerate palliative chemotherapy.  Nutrition will be crititcal and his current albumin is 1.5.   He has very little social support, lives alone, his sister plans to come from out of town on Monday.   6. Discharge Planning:  Pending   Care plan was discussed with Family Medicine/ Dr Gerlean Ren and Dr McDermod  Thank you for allowing the Palliative Medicine Team to assist in the care of this patient.   Time In: 0800 Time Out: 0835 Total Time 35 min Prolonged Time Billed  no         Knox Royalty, NP  09/03/2015, 9:00 AM  Please contact Palliative Medicine Team phone at [phone] for questions and concerns.

## 2015-09-03 NOTE — Progress Notes (Signed)
Physical Therapy Treatment Patient Details Name: Jesse Murray MRN: [id number] DOB: 11/30/1960 Today's Date: 09/03/2015    History of Present Illness 54 y.o. male admitted to [facility] on 08/31/2015 with abdominal pain found to have pancreatic mets and SBO s/p exp lap, colectomy and ilieostomy, mucus fistula on 08/16/2015.  Pt with significant PMHx of DVT R leg dx ~08/22/15, and L knee arthroscopy.      PT Comments    Pt needed a bit of encouragement to participate today as he reported he was fearful of falling because he is so shaky.  Extra precautions taken and pt did well and was able to progress gait distance further down the hallway.  HEP exercises reviewed.  PT will continue to follow acutely.    Follow Up Recommendations  SNF     Equipment Recommendations  Rolling walker with 5" wheels    Recommendations for Other Services   NA     Precautions / Restrictions Precautions Precautions: Fall Precaution Comments: abdominal wound and ostomy    Mobility  Bed Mobility               General bed mobility comments: pt up in recliner chair  Transfers Overall transfer level: Needs assistance Equipment used: Rolling walker (2 wheeled) Transfers: Sit to/from Stand Sit to Stand: Min guard         General transfer comment: Min guard assist for safety and balance during transitions. Verbal cues for safe hand placement.  Good, slow descent to sit.   Ambulation/Gait Ambulation/Gait assistance: Min guard Ambulation Distance (Feet): 55 Feet Assistive device: Rolling walker (2 wheeled) Gait Pattern/deviations: Step-through pattern;Shuffle;Trunk flexed Gait velocity: decreased Gait velocity interpretation: Below normal speed for age/gender General Gait Details: Verbal cues for upright posture, min guard assist for safety.  Chair pulled behind to encourage increased gait distance due to pt's fear of fatigue and falling.         Balance Overall balance assessment: Needs  assistance Sitting-balance support: Feet supported;No upper extremity supported Sitting balance-Leahy Scale: Good     Standing balance support: Bilateral upper extremity supported;Single extremity supported;No upper extremity supported Standing balance-Leahy Scale: Fair                      Cognition Arousal/Alertness: Awake/alert Behavior During Therapy: Flat affect Overall Cognitive Status: Within Functional Limits for tasks assessed                      Exercises General Exercises - Lower Extremity Long Arc Quad: AROM;Both;10 reps Heel Slides: AROM;Both;10 reps Hip ABduction/ADduction: AROM;Both;10 reps (adduction against pillow.  ) Hip Flexion/Marching: AROM;Both;10 reps Toe Raises: AROM;Both;10 reps Heel Raises: AROM;Both;10 reps        Pertinent Vitals/Pain Pain Assessment: 0-10 Pain Score: 7  Pain Location: bil legs and abdomen Pain Descriptors / Indicators: Aching;Burning Pain Intervention(s): Limited activity within patient's tolerance;Monitored during session;Repositioned           PT Goals (current goals can now be found in the care plan section) Acute Rehab PT Goals Patient Stated Goal: none stated today. Progress towards PT goals: Progressing toward goals    Frequency  Min 2X/week (per department protocol for SNF placement)    PT Plan Frequency needs to be updated       End of Session Equipment Utilized During Treatment: Gait belt Activity Tolerance: Patient limited by pain;Patient limited by fatigue Patient left: in chair;with call bell/phone within reach     Time: [phone]  PT Time Calculation (min) (ACUTE ONLY): 22 min  Charges:  $Gait Training: 8-22 mins                      Jesse Murray, PT, DPT [phone]   09/03/2015, 11:34 AM

## 2015-09-03 NOTE — Progress Notes (Signed)
Nutrition Follow-up  DOCUMENTATION CODES:   Severe malnutrition in context of chronic illness  INTERVENTION:   -Continue Ensure Enlive po BID, each supplement provides 350 kcal and 20 grams of protein  NUTRITION DIAGNOSIS:   Inadequate oral intake related to altered GI function as evidenced by meal completion < 50%.  Progressing  GOAL:   Patient will meet greater than or equal to 90% of their needs  Progressing  MONITOR:   PO intake, Supplement acceptance, Labs, Weight trends, Skin, I & O's  REASON FOR ASSESSMENT:   Consult New TPN/TNA  ASSESSMENT:   Jesse Murray is a 54 y.o. male presenting with abdominal pain, as well as bilateral leg swelling and pain. PMH is significant for EtOH abuse.   S/p Procedure(s) 08/13/2015: EXPLORATORY LAPAROTOMY PARTIAL COLECTOMY ILEOSTOMY MUCUS FISTULA  Pt working with PT at time of visit; ambulating hallways. PT reports that pt is making good progress, however, has flat affect when trying to engage pt in conversation.   NGT d/c on 08/31/15. TPN d/c on 09/02/15. Pt has been advanced to a soft diet. Appetite is fair; meal completion 45-50%. Ensure supplements have been ordered and pt is accepting well.   Per surgical note, continuing to monitor ostomy output. Noted >525 ml output within 24 hours per doc flowsheets.  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 Pt is considering palliative chemotherapy.   Palliative care team following; pt is awaiting for sister (who resides out of town) to assist with further [place] discussions.   Labs reviewed: Na: 131, Mg: 1.5 (on IV supplementation).   Diet Order:  DIET SOFT Room service appropriate?: Yes; Fluid consistency:: Thin  Skin:  Reviewed, no issues  Last BM:  09/03/15  Height:   Ht Readings from Last 1 Encounters:  09/01/2015 5\' 8"  (1.727 m)    Weight:   Wt Readings  from Last 1 Encounters:  09/03/15 192 lb 1.6 oz (87.136 kg)    Ideal Body Weight:  70 kg  BMI:  Body mass index is 29.22 kg/(m^2).  Estimated Nutritional Needs:   Kcal:  2000-2200  Protein:  95-110 grams  Fluid:  2.0-2.2 L  EDUCATION NEEDS:   No education needs identified at this time  Maxima Skelton A. Jimmye Norman, RD, LDN, CDE Pager: [phone] After hours Pager: [phone]

## 2015-09-04 DIAGNOSIS — Z0271 Encounter for disability determination: Secondary | ICD-10-CM

## 2015-09-04 LAB — BASIC METABOLIC PANEL
Anion gap: 8 (ref 5–15)
BUN: 8 mg/dL (ref 6–20)
CHLORIDE: 103 mmol/L (ref 101–111)
CO2: 23 mmol/L (ref 22–32)
Calcium: 7.8 mg/dL — ABNORMAL LOW (ref 8.9–10.3)
Creatinine, Ser: 0.57 mg/dL — ABNORMAL LOW (ref 0.61–1.24)
GFR calc Af Amer: >60 mL/min (ref >60)
GFR calc non Af Amer: >60 mL/min (ref >60)
GLUCOSE: 76 mg/dL (ref 65–99)
POTASSIUM: 4.2 mmol/L (ref 3.5–5.1)
Sodium: 134 mmol/L — ABNORMAL LOW (ref 135–145)

## 2015-09-04 LAB — CBC
HCT: 25.5 % — ABNORMAL LOW (ref 39.0–52.0)
HEMOGLOBIN: 8.2 g/dL — AB (ref 13.0–17.0)
MCH: 28.5 pg (ref 26.0–34.0)
MCHC: 32.2 g/dL (ref 30.0–36.0)
MCV: 88.5 fL (ref 78.0–100.0)
Platelets: 100 10*3/uL — ABNORMAL LOW (ref 150–400)
RBC: 2.88 MIL/uL — AB (ref 4.22–5.81)
RDW: 16.7 % — ABNORMAL HIGH (ref 11.5–15.5)
WBC: 12.7 10*3/uL — ABNORMAL HIGH (ref 4.0–10.5)

## 2015-09-04 MED ORDER — DEXTROSE-NACL 5-0.45 % IV SOLN: INTRAVENOUS | Status: DC

## 2015-09-04 MED ORDER — OXYCODONE HCL 5 MG PO TABS
15.0000 mg | ORAL_TABLET | ORAL | Status: DC | PRN
  Administered 2015-09-04 – 2015-09-05 (×3): 15 mg via ORAL
  Filled 2015-09-04 (×3): qty 3

## 2015-09-04 MED ORDER — KCL IN DEXTROSE-NACL 20-5-0.45 MEQ/L-%-% IV SOLN
INTRAVENOUS | Status: DC
  Administered 2015-09-04 – 2015-09-06 (×6): via INTRAVENOUS
  Administered 2015-09-06: 1000 mL via INTRAVENOUS
  Filled 2015-09-04 (×10): qty 1000

## 2015-09-04 NOTE — Progress Notes (Signed)
Family Medicine Teaching Service Daily Progress Note Intern Pager: [phone]  Patient name: Jesse Murray Medical record number: [id number] Date of birth: Dec 16, 1960 Age: 54 y.o. Gender: male  Primary Care Provider: No PCP Per Patient Consultants: surgery Code Status: FULL  Patient overview and Major Events to Date: 12/14 CT showing abdominal mass and SBO 12/16 Ex-lap with extensive abdominal mets, suspected pancreatic primary non-resectable, ileostomy 12/21: Diet advanced to full; weaning off TPN 12/22: Weaned off TPN; transitioned from heparin to Xarelto  Assessment and Plan: Jesse Murray is a 54 y.o. male presenting with abdominal pain, as well as bilateral leg swelling and pain. PMH is significant for EtOH abuse.   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 Ex-lap 12/16 found primary to be most likely pancreatic that is invading hepatic flexure; unresectable but ileostomy was done to help alleviate symptoms.   - Pain control: oxycontin 30 mg PO BID, oxycodone 15 mg q4 PO PRN (first step for breakthrough pain), hydrocodone 1 mg IV q4 PRN (second step for breakthrough pain). Consider decreasing oxy IR back to 10 mg to improve nausea.  - Per surgery, continue robaxin - Palliative care continuing to follow pt - appreciate recs  - PT/OT consulted - Wound care following  - Per surgery, consider imodium if stools in ostomy continue to be loose  Leg pain/swelling likely 2/2 bilateral DVTs: Most likely DVT due to hypercoagulable state from malignancy and/or recent inactivity after lying in bed for three weeks. Well's criteria for DVT 7 (high risk for DVT). Well's criteria for PE 5.5 (moderate risk). Physical exam also consistent with DVT. Patient transitioned from heparin to Xarelto for long-term  anticoagulation on 12/22.  - Consider CTA chest if difficulty breathing to rule out PE - Continue Xarelto  Hypotension: Most likely due to volume losses from high ileostomy output and inadequate PO intake (patient off TPN now but only eating 25% of meals). BPs still soft overnight (112/64, 104/69), but improving.  - IVF increased to maintenance - continue D5 1/2NS + KCl 20 @125  mL/hr - Encourage increased PO intake  Pressure ulcer: Stage II in gluteal cleft with serosanguinous drainage. Patient at increased risk due to malnutrition, relative immobility, and increased moisture. Foam island added 12/23.  - Wound care continuing to follow - will change foam island q3 days - Prescribed pressure-distributing chair cushion  Anemia: Hgb 9.7 on admission (12.3 one month ago). No signs of bleeding, so potentially just hemodilution as all cell lines have decreased. Also potentially related to malignancy. FOBT negative. Vitals are stable. PT elevated at 32, APTT elevated at 81. INR improved to 1.3. Hgb 9.3 (12/21) after 2u pRBCs. Stable at 8.1 (12/24).   - Daily H&H - Transfusion threshold <7  Thrombocytopenia: Plts decreased to 80 (12/24) from 100 (12/23); 185 on admit (12/14). 4T score = 1; unlikely HIT. Transitioned from heparin to Xarelto 12/22.  - Continue to monitor   AKI: Resolved. Unsure of baseline Cr but one month ago was 0.69. On admission 1.31. Patient did receive IV contrast in ED for CT scan. Also with decreased PO intake likely a prerenal etiology as well. BUN/Cr ratio >20. Cr improved to 0.57 (12/23).  - Avoid nephrotoxic agents including contrast - I/Os  Social: Patient has agreed to SNF placement with goal of regaining strength to became candidate for palliative  chemo. Does have sister coming to stay with him on 12/26 that could potentially help long-term.  - SW Consulted: Working on SNF placement  FEN/GI: reg diet,  D5 1/2NS + KCl 20 @125  mL/hr Prophylaxis: Xarelto  Dispo: SNF  pending stabilization of BP with PO intake alone  Subjective:  Patient reporting nausea after taking increased dose (15 mg) of oxy IR both last night and this AM (dose was increased yesterday from 10 mg q4 PRN to 15 mg q4 PRN). Is requesting to be switched back to Dilaudid for pain management, however patient has done well with pain control without Dilaudid for past two days. Reports improvement of leg pain.   Objective: Temp:  [97.5 F (36.4 C)-98.1 F (36.7 C)] 98.1 F (36.7 C) (12/24 0515) Pulse Rate:  [85-92] 88 (12/24 0515) Resp:  [17-18] 18 (12/24 0515) BP: (89-112)/(64-69) 104/69 mmHg (12/24 0515) SpO2:  [91 %-97 %] 91 % (12/24 0515) Physical Exam: General: ill-appearing, cachectic, lying in bed in NAD Cardiovascular: RRR, no murmurs appreciated Respiratory: CTAB, no wheezes Abdomen: +BS, mildly TTP diffusely Extremities: 1+ pitting edema to knee on RLE with 3+ pitting edema of foot to ankle; 3+ edema of L foot to ankle, 1+ pitting edema to knee of LLE, no tenderness to palpation bilaterally  Laboratory:  Recent Labs Lab 09/03/15 0500 09/04/15 0453 09/05/15 0503  WBC 15.8* 12.7* 12.7*  HGB 8.8* 8.2* 8.1*  HCT 26.4* 25.5* 24.9*  PLT 96* 100* 80*    Recent Labs Lab 08/30/15 0447 08/31/15 0441 09/01/15 0538 09/02/15 0600 09/04/15 0453  NA 142 139 134* 131* 134*  K 3.7 3.3* 3.6 3.8 4.2  CL 115* 111 106 102 103  CO2 22 23 22 22 23   BUN 12 8 9 10 8   CREATININE 0.81 0.70 0.64 0.56* 0.57*  CALCIUM 7.3* 7.6* 7.8* 7.8* 7.8*  PROT 4.6* 4.8*  --   --   --   BILITOT 1.3* 1.6*  --   --   --   ALKPHOS 387* 425*  --   --   --   ALT 18 15*  --   --   --   AST 30 26  --   --   --   GLUCOSE 126* 123* 120* 113* 76    Imaging/Diagnostic Tests: None new   Verner Mould, MD 09/05/2015, 6:42 AM PGY-1, [place] Intern pager: [phone], text pages welcome

## 2015-09-04 NOTE — Consult Note (Addendum)
WOC ostomy follow up Stoma type/location: RLQ, ileostomy with mucous fistula  Stomal assessment/size: 1 3/8" slightly oval, budded Peristomal assessment: NA Treatment options for stomal/peristomal skin: NA Output high volume liquid stool Ostomy pouching: 2pc. 2 3/4" in place with good seal.   Education provided:  Explained goal for patient to learn to empty pouch over next few days.  He reports "I know how to do it". NT in the room preparing to give patient bath.  Asked that staff start to assist patient to empty rather than emptying pouch.   Plans possible to go to rehab, will need continued support for ostomy care.  He is not yet learned pouch change.  Enrolled patient in [place] Start Discharge program: Yes  [place] team will follow along with you for support with ostomy care. Marica Otter RN,CWOCN [phone]  NT called [place] nurse back in the room during bath to assess area in the gluteal cleft.  Linear area just at the proximal edge of the gluteal fold.  Fissure related to moisture, serosanguinous drainage. Silicone foam applied into the gluteal skin fold. Explained rationale to the patient.  Change every 3 days.  Notified bedside nurse, added to documentation flow sheet.  Marica Otter RN, CWOCN

## 2015-09-04 NOTE — Progress Notes (Signed)
[facility] Progress Note  7 Days Post-Op  Subjective: He's mad I don't have a business card.  Says he's not having much pain, no N/V.  Ambulating well.  Ostomy output stable, its thickening up.  Good flatus and output.  Midline wound with staples in place and no significant output  Objective: Vital signs in last 24 hours: Temp:  [97.4 F (36.3 C)-98.4 F (36.9 C)] 98.1 F (36.7 C) (12/23 0539) Pulse Rate:  [82-94] 82 (12/23 0539) Resp:  [18-19] 19 (12/23 0539) BP: (90-102)/(60-65) 90/60 mmHg (12/23 0539) SpO2:  [97 %-98 %] 98 % (12/23 0539) Last BM Date: 09/04/15  Intake/Output from previous day: 12/22 0701 - 12/23 0700 In: 1506 [P.O.:360; I.V.:1146] Out: 825 [Urine:500; Stool:325] Intake/Output this shift: Total I/O In: 120 [P.O.:120] Out: 200 [Urine:200]  PE: Gen:  Alert, NAD, pleasant Abd: Soft, NT/ND, +BS, no HSM, incisions C/D/I with staples in place, ileostomy pink budded with good stool and BM output   Lab Results:   Recent Labs  09/03/15 0500 09/04/15 0453  WBC 15.8* 12.7*  HGB 8.8* 8.2*  HCT 26.4* 25.5*  PLT 96* 100*   BMET  Recent Labs  09/02/15 0600 09/04/15 0453  NA 131* 134*  K 3.8 4.2  CL 102 103  CO2 22 23  GLUCOSE 113* 76  BUN 10 8  CREATININE 0.56* 0.57*  CALCIUM 7.8* 7.8*   PT/INR No results for input(s): LABPROT, INR in the last 72 hours. CMP     Component Value Date/Time   NA 134* 09/04/2015 0453   K 4.2 09/04/2015 0453   CL 103 09/04/2015 0453   CO2 23 09/04/2015 0453   GLUCOSE 76 09/04/2015 0453   BUN 8 09/04/2015 0453   CREATININE 0.57* 09/04/2015 0453   CREATININE 0.69* 07/14/2015 1732   CALCIUM 7.8* 09/04/2015 0453   PROT 4.8* 08/31/2015 0441   ALBUMIN 1.5* 08/31/2015 0441   AST 26 08/31/2015 0441   ALT 15* 08/31/2015 0441   ALKPHOS 425* 08/31/2015 0441   BILITOT 1.6* 08/31/2015 0441   GFRNONAA >60 09/04/2015 0453   GFRAA >60 09/04/2015 0453   Lipase     Component Value Date/Time   LIPASE 31  08/14/2015 0940       Studies/Results: No results found.  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   -Oncology and palliative care following -Pain control per palliative -WOC nursing following for ostomy/mucous fistula teaching -Staples out 14 days post op, he can have these removed if he's still here or at the SNF.  Will arrange for OP follow up with Dr. Ninfa Linden  Path report shows: Colon, segmental resection for tumor, Right - MULTIPLE FOCI OF SEROSAL BASED ADENOCARCINOMA, CONSISTENT WITH METASTATIC COLORECTAL ADENOCARCINOMA, POORLY DIFFERENTIATED. - METASTATIC CARCINOMA IN 10 OF 25 LYMPH NODES (  10/25). - LYMPHOVASCULAR INVASION IS IDENTIFIED, DIFFUSE. - PERINEURAL INVASION IS IDENTIFIED. - SEE COMMENT.  Liver, left adrenal and mesenteric nodal metastasis by CT Bilateral common femoral venous thrombosis by CT Small intermediate lung nodules Hyponatremia Acute renal insuffiencey Elevated LFT's related to liver metastasis Weight loss 30-40 lbs over 1.5 months PCM & deconditioning - prealbumin 2.6 on TPN, PT DVT treatment - On xarelto ABL Anemia - Hgb 8.2 this am, had blood on 12/20  Disp - SNF at discharge per therapies    LOS: 9 days    [name] 09/04/2015, 10:23 AM Pager: [phone]

## 2015-09-05 LAB — CBC
HCT: 24.9 % — ABNORMAL LOW (ref 39.0–52.0)
HEMOGLOBIN: 8.1 g/dL — AB (ref 13.0–17.0)
MCH: 28.7 pg (ref 26.0–34.0)
MCHC: 32.5 g/dL (ref 30.0–36.0)
MCV: 88.3 fL (ref 78.0–100.0)
PLATELETS: 80 10*3/uL — AB (ref 150–400)
RBC: 2.82 MIL/uL — AB (ref 4.22–5.81)
RDW: 16.9 % — ABNORMAL HIGH (ref 11.5–15.5)
WBC: 12.7 10*3/uL — AB (ref 4.0–10.5)

## 2015-09-05 MED ORDER — HYDROMORPHONE HCL 2 MG PO TABS
2.0000 mg | ORAL_TABLET | ORAL | Status: DC | PRN
  Administered 2015-09-05: 4 mg via ORAL
  Administered 2015-09-05 (×2): 2 mg via ORAL
  Filled 2015-09-05 (×2): qty 1
  Filled 2015-09-05: qty 2

## 2015-09-05 MED ORDER — ONDANSETRON HCL 4 MG/2ML IJ SOLN
4.0000 mg | Freq: Four times a day (QID) | INTRAMUSCULAR | Status: DC | PRN
  Administered 2015-09-07 – 2015-09-09 (×3): 4 mg via INTRAVENOUS
  Filled 2015-09-05 (×3): qty 2

## 2015-09-05 MED ORDER — OXYCODONE HCL 5 MG PO TABS
10.0000 mg | ORAL_TABLET | ORAL | Status: DC | PRN
Start: 2015-09-05 — End: 2015-09-05

## 2015-09-05 MED ORDER — LORAZEPAM 1 MG PO TABS
1.0000 mg | ORAL_TABLET | Freq: Once | ORAL | Status: AC
  Administered 2015-09-05: 1 mg via ORAL
  Filled 2015-09-05: qty 1

## 2015-09-05 MED ORDER — MORPHINE SULFATE (PF) 2 MG/ML IV SOLN
1.0000 mg | INTRAVENOUS | Status: DC | PRN
  Administered 2015-09-05 – 2015-09-06 (×2): 1 mg via INTRAVENOUS
  Filled 2015-09-05 (×2): qty 1

## 2015-09-05 NOTE — Progress Notes (Signed)
Patient  Complaining of shortness of breath and anxiety.Pt tachypneic,tachycardic,restless. Applied O2 2liters;administered Morphine and Ativan as ordered.  Notified Dr. [name]. One time dose of 1mg  PO Ativan ordered.

## 2015-09-05 NOTE — Progress Notes (Signed)
Pt still has no bed offers.  SW to continue to follow for d/c assistance.  Bernita Raisin, [place] Social Work [phone]

## 2015-09-05 NOTE — Progress Notes (Signed)
Patient ID: Jesse Murray, male   DOB: 30-Oct-1960, 54 y.o.   MRN: [id number]     [facility]      [address] [place], [place] [address]    Phone: [phone] FAX: [phone]     Subjective: Nausea after pain meds. Appetite is okay, doesn't like the food. Change to regular to give him more options. Drinking 3 ensures Sitting up in chair.   Objective:  Vital signs:  Filed Vitals:   09/04/15 0539 09/04/15 1717 09/04/15 2106 09/05/15 0515  BP: 90/60 89/66 112/64 104/69  Pulse: 82 92 85 88  Temp: 98.1 F (36.7 C) 98 F (36.7 C) 97.5 F (36.4 C) 98.1 F (36.7 C)  TempSrc: Oral Oral Oral Oral  Resp: _0 Height:      Weight:      SpO2: 98% 95% 97% 91%    Last BM Date: 09/05/15  Intake/Output   Yesterday:  12/23 0701 - 12/24 0700 In: 9244 [P.O.:1140; I.V.:2429] Out: 1575 [Urine:1575] This shift:  Total I/O In: -  Out: 575 [Urine:425; Stool:150]   Physical Exam: General: Pt awake/alert/oriented x4 in no acute distress  Abdomen: Soft.  Nondistended.   Mildly tender at incisions only. Midline incision c/d/i.  RLQ ostomy functioning, semi solid stool.  No evidence of peritonitis.  No incarcerated hernias.    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[place])   SBO (small bowel obstruction) from colon cancer s/p colectomy/ileostomy 08/29/2015   Bilateral leg edema   Malignant neoplasm of hepatic flexure ([place])   Metastatic cancer ([place])    Results:   Labs: Results for orders placed or performed during the hospital encounter  of 08/17/2015 (from the past 48 hour(s))  CBC     Status: Abnormal   Collection Time: 09/04/15  4:53 AM  Result Value Ref Range   WBC 12.7 (H) 4.0 - 10.5 K/uL   RBC 2.88 (L) 4.22 - 5.81 MIL/uL   Hemoglobin 8.2 (L) 13.0 - 17.0 g/dL   HCT 25.5 (L) 39.0 - 52.0 %   MCV 88.5 78.0 - 100.0 fL   MCH 28.5 26.0 - 34.0 pg   MCHC 32.2 30.0 - 36.0 g/dL   RDW 16.7 (H) 11.5 - 15.5 %   Platelets 100 (L) 150 - 400 K/uL    Comment: CONSISTENT WITH PREVIOUS RESULT  Basic metabolic panel     Status: Abnormal   Collection Time: 09/04/15  4:53 AM  Result Value Ref Range   Sodium 134 (L) 135 - 145 mmol/L   Potassium 4.2 3.5 - 5.1 mmol/L   Chloride 103 101 - 111 mmol/L   CO2 23 22 - 32 mmol/L   Glucose, Bld 76 65 - 99 mg/dL   BUN 8 6 - 20 mg/dL   Creatinine, Ser 0.57 (L) 0.61 - 1.24 mg/dL   Calcium 7.8 (L) 8.9 - 10.3 mg/dL   GFR calc non Af Amer >60 >60 mL/min   GFR calc Af Amer >60 >60 mL/min    Comment: (NOTE) The eGFR has been calculated  using the CKD EPI equation. This calculation has not been validated in all clinical situations. eGFR's persistently <60 mL/min signify possible Chronic Kidney Disease.    Anion gap 8 5 - 15  CBC     Status: Abnormal   Collection Time: 09/05/15  5:03 AM  Result Value Ref Range   WBC 12.7 (H) 4.0 - 10.5 K/uL   RBC 2.82 (L) 4.22 - 5.81 MIL/uL   Hemoglobin 8.1 (L) 13.0 - 17.0 g/dL   HCT 24.9 (L) 39.0 - 52.0 %   MCV 88.3 78.0 - 100.0 fL   MCH 28.7 26.0 - 34.0 pg   MCHC 32.5 30.0 - 36.0 g/dL   RDW 16.9 (H) 11.5 - 15.5 %   Platelets 80 (L) 150 - 400 K/uL    Comment: CONSISTENT WITH PREVIOUS RESULT    Imaging / Studies: No results found.  Medications / Allergies:  Scheduled Meds: . acetaminophen  1,000 mg Oral TID  . feeding supplement (ENSURE ENLIVE)  237 mL Oral BID BM  . lip balm   Topical BID  . oxyCODONE  60 mg Oral Q12H  . Rivaroxaban  15 mg Oral BID WC  . sodium chloride  10-40 mL Intracatheter Q12H   Continuous Infusions: . dextrose 5 % and  0.45 % NaCl with KCl 20 mEq/L 125 mL/hr at 09/05/15 0514   PRN Meds:.alum & mag hydroxide-simeth, bismuth subsalicylate, loperamide, LORazepam, magic mouthwash, menthol-cetylpyridinium, methocarbamol (ROBAXIN)  IV, oxyCODONE, phenol, sodium chloride  Antibiotics: Anti-infectives    Start     Dose/Rate Route Frequency Ordered Stop   08/25/2015 0815  ceFAZolin (ANCEF) IVPB 2 g/50 mL premix     2 g 100 mL/hr over 30 Minutes Intravenous  Once 08/21/2015 0804 09/06/2015 1145   08/14/2015 0815  metroNIDAZOLE (FLAGYL) IVPB 500 mg    Comments:  OR holding area   500 mg 100 mL/hr over 60 Minutes Intravenous  Once 08/21/2015 0804 09/08/2015 0919        Assessment/Plan Stage IV adenocarcinoma of colon POD#8 exploratory laparotomy, partial colectomy, ileostomy---Dr. Ninfa Linden  -continue to encourage POs, supplement shakes, mobilize -ileostomy, output is decreasing -consider changing oxycodone to dilaudid d/t nausea.  He does not have an ileus and nausea correlates with taking oxy -DC staples on POD#14 DVT-Xarelto PCM-on POs, supplement shakes Dispo-stable for DC from a surgical standpoint.  Awaiting SNF bed.    Erby Pian, ANP-BC [facility]   09/05/2015 9:05 AM

## 2015-09-06 ENCOUNTER — Inpatient Hospital Stay (HOSPITAL_COMMUNITY): Payer: Medicaid Other

## 2015-09-06 DIAGNOSIS — F05 Delirium due to known physiological condition: Secondary | ICD-10-CM

## 2015-09-06 DIAGNOSIS — R410 Disorientation, unspecified: Secondary | ICD-10-CM | POA: Insufficient documentation

## 2015-09-06 DIAGNOSIS — C189 Malignant neoplasm of colon, unspecified: Secondary | ICD-10-CM | POA: Insufficient documentation

## 2015-09-06 DIAGNOSIS — R0682 Tachypnea, not elsewhere classified: Secondary | ICD-10-CM | POA: Insufficient documentation

## 2015-09-06 DIAGNOSIS — R509 Fever, unspecified: Secondary | ICD-10-CM | POA: Insufficient documentation

## 2015-09-06 DIAGNOSIS — Z933 Colostomy status: Secondary | ICD-10-CM | POA: Insufficient documentation

## 2015-09-06 LAB — CBC WITH DIFFERENTIAL/PLATELET
BASOS ABS: 0.1 10*3/uL (ref 0.0–0.1)
Basophils Relative: 0 %
EOS PCT: 1 %
Eosinophils Absolute: 0.2 10*3/uL (ref 0.0–0.7)
HEMATOCRIT: 23.2 % — AB (ref 39.0–52.0)
HEMOGLOBIN: 7.6 g/dL — AB (ref 13.0–17.0)
LYMPHS ABS: 1.7 10*3/uL (ref 0.7–4.0)
LYMPHS PCT: 11 %
MCH: 28.6 pg (ref 26.0–34.0)
MCHC: 32.8 g/dL (ref 30.0–36.0)
MCV: 87.2 fL (ref 78.0–100.0)
Monocytes Absolute: 0.9 10*3/uL (ref 0.1–1.0)
Monocytes Relative: 6 %
NEUTROS ABS: 13.2 10*3/uL — AB (ref 1.7–7.7)
NEUTROS PCT: 82 %
PLATELETS: 67 10*3/uL — AB (ref 150–400)
RBC: 2.66 MIL/uL — AB (ref 4.22–5.81)
RDW: 17 % — ABNORMAL HIGH (ref 11.5–15.5)
WBC: 16.1 10*3/uL — AB (ref 4.0–10.5)

## 2015-09-06 LAB — COMPREHENSIVE METABOLIC PANEL
ALT: 24 U/L (ref 17–63)
ANION GAP: 10 (ref 5–15)
AST: 48 U/L — ABNORMAL HIGH (ref 15–41)
Albumin: 1.5 g/dL — ABNORMAL LOW (ref 3.5–5.0)
Alkaline Phosphatase: 1099 U/L — ABNORMAL HIGH (ref 38–126)
BUN: <5 mg/dL — ABNORMAL LOW (ref 6–20)
CHLORIDE: 104 mmol/L (ref 101–111)
CO2: 21 mmol/L — AB (ref 22–32)
Calcium: 7.8 mg/dL — ABNORMAL LOW (ref 8.9–10.3)
Creatinine, Ser: 0.66 mg/dL (ref 0.61–1.24)
Glucose, Bld: 112 mg/dL — ABNORMAL HIGH (ref 65–99)
POTASSIUM: 4.1 mmol/L (ref 3.5–5.1)
SODIUM: 135 mmol/L (ref 135–145)
Total Bilirubin: 3.9 mg/dL — ABNORMAL HIGH (ref 0.3–1.2)
Total Protein: 4.9 g/dL — ABNORMAL LOW (ref 6.5–8.1)

## 2015-09-06 LAB — CBC
HCT: 25.3 % — ABNORMAL LOW (ref 39.0–52.0)
Hemoglobin: 8.3 g/dL — ABNORMAL LOW (ref 13.0–17.0)
MCH: 28.7 pg (ref 26.0–34.0)
MCHC: 32.8 g/dL (ref 30.0–36.0)
MCV: 87.5 fL (ref 78.0–100.0)
PLATELETS: 84 10*3/uL — AB (ref 150–400)
RBC: 2.89 MIL/uL — AB (ref 4.22–5.81)
RDW: 16.8 % — AB (ref 11.5–15.5)
WBC: 16.8 10*3/uL — ABNORMAL HIGH (ref 4.0–10.5)

## 2015-09-06 LAB — URINALYSIS, ROUTINE W REFLEX MICROSCOPIC
Glucose, UA: NEGATIVE mg/dL
HGB URINE DIPSTICK: NEGATIVE
Ketones, ur: NEGATIVE mg/dL
Leukocytes, UA: NEGATIVE
NITRITE: NEGATIVE
PROTEIN: NEGATIVE mg/dL
Specific Gravity, Urine: 1.014 (ref 1.005–1.030)
pH: 6 (ref 5.0–8.0)

## 2015-09-06 LAB — PROTIME-INR
INR: 6.77 — AB (ref 0.00–1.49)
PROTHROMBIN TIME: 56.4 s — AB (ref 11.6–15.2)

## 2015-09-06 LAB — APTT: aPTT: 64 seconds — ABNORMAL HIGH (ref 24–37)

## 2015-09-06 LAB — MRSA PCR SCREENING: MRSA BY PCR: NEGATIVE

## 2015-09-06 LAB — BILIRUBIN, FRACTIONATED(TOT/DIR/INDIR)
BILIRUBIN INDIRECT: 1.7 mg/dL — AB (ref 0.3–0.9)
Bilirubin, Direct: 2.4 mg/dL — ABNORMAL HIGH (ref 0.1–0.5)
Total Bilirubin: 4.1 mg/dL — ABNORMAL HIGH (ref 0.3–1.2)

## 2015-09-06 LAB — LACTIC ACID, PLASMA: LACTIC ACID, VENOUS: 1.7 mmol/L (ref 0.5–2.0)

## 2015-09-06 MED ORDER — SODIUM CHLORIDE 0.9 % IV BOLUS (SEPSIS)
500.0000 mL | Freq: Once | INTRAVENOUS | Status: AC
  Administered 2015-09-06: 500 mL via INTRAVENOUS

## 2015-09-06 MED ORDER — PIPERACILLIN-TAZOBACTAM 3.375 G IVPB 30 MIN
3.3750 g | Freq: Once | INTRAVENOUS | Status: AC
  Administered 2015-09-06: 3.375 g via INTRAVENOUS
  Filled 2015-09-06: qty 50

## 2015-09-06 MED ORDER — LORAZEPAM 2 MG/ML IJ SOLN
0.5000 mg | Freq: Every day | INTRAMUSCULAR | Status: DC
  Administered 2015-09-06 – 2015-09-12 (×7): 0.5 mg via INTRAVENOUS
  Filled 2015-09-06 (×8): qty 1

## 2015-09-06 MED ORDER — HALOPERIDOL LACTATE 5 MG/ML IJ SOLN
1.0000 mg | Freq: Four times a day (QID) | INTRAMUSCULAR | Status: AC | PRN
  Administered 2015-09-06 – 2015-09-07 (×2): 1 mg via INTRAVENOUS
  Filled 2015-09-06 (×2): qty 1

## 2015-09-06 MED ORDER — HALOPERIDOL LACTATE 5 MG/ML IJ SOLN
2.0000 mg | Freq: Once | INTRAMUSCULAR | Status: AC
  Administered 2015-09-06: 2 mg via INTRAVENOUS
  Filled 2015-09-06: qty 1

## 2015-09-06 MED ORDER — IOHEXOL 350 MG/ML SOLN
100.0000 mL | Freq: Once | INTRAVENOUS | Status: AC | PRN
  Administered 2015-09-06: 100 mL via INTRAVENOUS

## 2015-09-06 MED ORDER — ACETAMINOPHEN 650 MG RE SUPP
650.0000 mg | RECTAL | Status: DC | PRN
  Administered 2015-09-06 (×2): 650 mg via RECTAL
  Filled 2015-09-06 (×4): qty 1

## 2015-09-06 MED ORDER — HYDROMORPHONE HCL 1 MG/ML IJ SOLN: 2.0000 mg | INTRAMUSCULAR | Status: DC

## 2015-09-06 MED ORDER — VANCOMYCIN HCL IN DEXTROSE 1-5 GM/200ML-% IV SOLN
1000.0000 mg | Freq: Three times a day (TID) | INTRAVENOUS | Status: DC
  Administered 2015-09-06 – 2015-09-08 (×6): 1000 mg via INTRAVENOUS
  Filled 2015-09-06 (×8): qty 200

## 2015-09-06 MED ORDER — HYDROMORPHONE HCL 2 MG PO TABS
4.0000 mg | ORAL_TABLET | Freq: Four times a day (QID) | ORAL | Status: DC
  Administered 2015-09-06: 4 mg via ORAL
  Filled 2015-09-06: qty 2

## 2015-09-06 MED ORDER — IOHEXOL 300 MG/ML  SOLN
50.0000 mL | INTRAMUSCULAR | Status: AC
  Administered 2015-09-06: 25 mL via ORAL

## 2015-09-06 MED ORDER — HYDROMORPHONE HCL 1 MG/ML IJ SOLN
2.0000 mg | Freq: Four times a day (QID) | INTRAMUSCULAR | Status: DC
  Administered 2015-09-06 – 2015-09-08 (×9): 2 mg via INTRAVENOUS
  Filled 2015-09-06 (×9): qty 2

## 2015-09-06 MED ORDER — RIVAROXABAN 15 MG PO TABS: 15.0000 mg | ORAL_TABLET | Freq: Two times a day (BID) | ORAL | Status: DC

## 2015-09-06 MED ORDER — PIPERACILLIN-TAZOBACTAM 3.375 G IVPB
3.3750 g | Freq: Three times a day (TID) | INTRAVENOUS | Status: DC
  Administered 2015-09-06 – 2015-09-09 (×9): 3.375 g via INTRAVENOUS
  Filled 2015-09-06 (×11): qty 50

## 2015-09-06 MED ORDER — ACETAMINOPHEN 500 MG PO TABS
500.0000 mg | ORAL_TABLET | ORAL | Status: DC | PRN
  Filled 2015-09-06: qty 1

## 2015-09-06 NOTE — Progress Notes (Addendum)
MD paged to notify regarding results of INR of 6.77/PT 56.4.

## 2015-09-06 NOTE — Progress Notes (Signed)
Patient ID: Jesse Murray, male   DOB: [birth year], 54 y.o.   MRN: [id number]     [facility]      [address] [place], [place] [address]    Phone: [phone] FAX: [phone]     Subjective: Ostomy functioning, 32m recorded. Overnight events noted. Pt is agitated.  Tachypnic, tachycardic, BP is soft. C/o sob started yesterday. Breathing is labored.   Objective:  Vital signs:  Filed Vitals:   09/06/15 0552 09/06/15 0640 09/06/15 0700 09/06/15 0800  BP: 133/83  120/69 136/78  Pulse: 129 125 130 124  Temp: 101.7 F (38.7 C)  102.4 F (39.1 C) 99 F (37.2 C)  TempSrc:   Axillary Oral  Resp: '26 27  30  '$ Height:      Weight:    84.414 kg (186 lb 1.6 oz)  SpO2: 95% 94% 98% 96%    Last BM Date: 09/05/15  Intake/Output   Yesterday:  12/24 0701 - 12/25 0700 In: 3514.2 [P.O.:360; I.V.:3154.2] Out: 1925 [[EHOZY:2482 Stool:350] This shift:    I/O last 3 completed shifts: In: 5640.2 [P.O.:600; I.V.:5040.2] Out: 2825 [Urine:2475; Stool:350]   Physical Exam: General: Pt awake/alert/oriented x4 in no acute distress  Abdomen: Soft. Nondistended. Mildly tender at incisions only. Midline incision c/d/i. RLQ stoma is pink and viable. No evidence of peritonitis. No incarcerated hernias.   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 Active Problems:   Abdominal pain   Palliative care encounter   DNR (do not resuscitate) discussion   Pain, cancer   Acute deep vein thrombosis (DVT) of proximal vein of lower extremity (HCC)   Absolute anemia   Leg pain, bilateral   Ileostomy in place ([facility]   Protein-calorie malnutrition, severe ([facility] [place]   Adenocarcinoma carcinomatosis ([facility]   SBO (small bowel obstruction) from colon cancer s/p colectomy/ileostomy 08/29/2015   Bilateral leg edema   Malignant neoplasm of hepatic flexure ([facility]   Metastatic cancer ([facility]    Results:    Labs: Results for orders placed or performed during the hospital encounter of 09/01/2015 (from the past 48 hour(s))  CBC     Status: Abnormal   Collection Time: 09/05/15  5:03 AM  Result Value Ref Range   WBC 12.7 (H) 4.0 - 10.5 K/uL   RBC 2.82 (L) 4.22 - 5.81 MIL/uL   Hemoglobin 8.1 (L) 13.0 - 17.0 g/dL   HCT 24.9 (L) 39.0 - 52.0 %   MCV 88.3 78.0 - 100.0 fL   MCH 28.7 26.0 - 34.0 pg   MCHC 32.5 30.0 - 36.0 g/dL   RDW 16.9 (H) 11.5 - 15.5 %   Platelets 80 (L) 150 - 400 K/uL    Comment: CONSISTENT WITH PREVIOUS RESULT  CBC     Status: Abnormal   Collection Time: 09/06/15  4:04 AM  Result Value Ref Range   WBC 16.8 (H) 4.0 - 10.5 K/uL   RBC 2.89 (L) 4.22 - 5.81 MIL/uL   Hemoglobin 8.3 (L) 13.0 - 17.0 g/dL   HCT 25.3 (L) 39.0 - 52.0 %   MCV 87.5 78.0 - 100.0 fL   MCH 28.7 26.0 - 34.0 pg   MCHC 32.8 30.0 - 36.0 g/dL   RDW 16.8 (H) 11.5 - 15.5 %   Platelets 84 (L) 150 - 400 K/uL    Comment: CONSISTENT WITH PREVIOUS RESULT  Comprehensive metabolic panel     Status: Abnormal   Collection Time: 09/06/15  4:04 AM  Result Value Ref Range   Sodium 135 135 - 145 mmol/L   Potassium 4.1 3.5 - 5.1 mmol/L   Chloride 104 101 - 111 mmol/L   CO2 21 (L) 22 - 32 mmol/L   Glucose, Bld 112 (H) 65 - 99 mg/dL   BUN <5 (L) 6 - 20 mg/dL   Creatinine, Ser 0.66 0.61 - 1.24 mg/dL   Calcium 7.8 (L) 8.9 - 10.3 mg/dL   Total Protein 4.9 (L) 6.5 - 8.1 g/dL   Albumin 1.5 (L) 3.5 - 5.0 g/dL   AST 48 (H) 15 - 41 U/L   ALT 24 17 - 63 U/L   Alkaline Phosphatase 1099 (H) 38 - 126 U/L   Total Bilirubin 3.9 (H) 0.3 - 1.2 mg/dL   GFR calc non Af Amer >60 >60 mL/min   GFR calc Af Amer >60 >60 mL/min    Comment: (NOTE) The eGFR has been calculated using the CKD EPI equation. This calculation has not been validated in all clinical situations. eGFR's persistently <60 mL/min signify possible Chronic Kidney Disease.    Anion gap 10 5 - 15  Lactic acid, plasma     Status: None   Collection Time: 09/06/15   4:04 AM  Result Value Ref Range   Lactic Acid, Venous 1.7 0.5 - 2.0 mmol/L    Imaging / Studies: Dg Chest Port 1 View  09/06/2015  CLINICAL DATA:  Acute onset of fever and confusion. Initial encounter. EXAM: PORTABLE CHEST 1 VIEW COMPARISON:  Chest radiograph performed 08/29/2015 FINDINGS: Mild bibasilar opacities may reflect atelectasis or mild pneumonia. No pleural effusion or pneumothorax is seen. The cardiomediastinal silhouette is normal in size. No acute osseous abnormalities are identified. A right PICC is noted ending about the distal SVC. IMPRESSION: Mild bibasilar airspace opacities may reflect atelectasis or mild pneumonia. Electronically Signed   By: Garald Balding M.D.   On: 09/06/2015 06:13    Medications / Allergies:  Scheduled Meds: . feeding supplement (ENSURE ENLIVE)  237 mL Oral BID BM  .  HYDROmorphone (DILAUDID) injection  2 mg Intravenous Q6H  . lip balm   Topical BID  . piperacillin-tazobactam  3.375 g Intravenous Once  . piperacillin-tazobactam (ZOSYN)  IV  3.375 g Intravenous Q8H  . Rivaroxaban  15 mg Oral BID WC  . sodium chloride  10-40 mL Intracatheter Q12H  . vancomycin  1,000 mg Intravenous Q8H   Continuous Infusions: . dextrose 5 % and 0.45 % NaCl with KCl 20 mEq/L 125 mL/hr at 09/06/15 0428   PRN Meds:.acetaminophen, acetaminophen, alum & mag hydroxide-simeth, bismuth subsalicylate, loperamide, LORazepam, magic mouthwash, menthol-cetylpyridinium, methocarbamol (ROBAXIN)  IV, morphine injection, ondansetron (ZOFRAN) IV, phenol, sodium chloride  Antibiotics: Anti-infectives    Start     Dose/Rate Route Frequency Ordered Stop   09/06/15 1400  piperacillin-tazobactam (ZOSYN) IVPB 3.375 g     3.375 g 12.5 mL/hr over 240 Minutes Intravenous Every 8 hours 09/06/15 0841     09/06/15 1000  vancomycin (VANCOCIN) IVPB 1000 mg/200 mL premix     1,000 mg 200 mL/hr over 60 Minutes Intravenous Every 8 hours 09/06/15 0843     09/06/15 0900  piperacillin-tazobactam  (ZOSYN) IVPB 3.375 g     3.375 g 100 mL/hr over 30 Minutes Intravenous  Once 09/06/15 0841     09/07/2015 0815  ceFAZolin (ANCEF) IVPB 2 g/50 mL premix     2 g 100 mL/hr over 30 Minutes Intravenous  Once 08/19/2015 0804 08/25/2015 1145  08/29/2015 0815  metroNIDAZOLE (FLAGYL) IVPB 500 mg    Comments:  OR holding area   500 mg 100 mL/hr over 60 Minutes Intravenous  Once 08/26/2015 0804 08/13/2015 0919        Assessment/Plan Stage IV adenocarcinoma of colon POD#9 exploratory laparotomy, partial colectomy, ileostomy---Dr. Ninfa Linden  -continue to encourage POs, supplement shakes -ileostomy, output is stable -DC staples on POD#14 ID-fevers and leukocytosis.  PNA or AXR.  Abdominal exam is unchanged.  May consider a CT of A/P to r/u an abscess, but has great bowel sounds, soft and ostomy is functioning.  Vanc/zosyn started. resp failure-per primary team.  Concerned he will worsen and require intubation.  DVT-Xarelto PCM-on POs, supplement shakes Dispo-SDU    Erby Pian, ANP-BC [facility]   09/06/2015 9:11 AM

## 2015-09-06 NOTE — Progress Notes (Signed)
MEDICATION-RELATED CONSULT NOTE - INITIAL   Pharmacy Consult for drugs that may cause delirium Indication: development of delirium  No Known Allergies  Labs:  Recent Labs  09/03/15 0500 09/04/15 0453 09/05/15 0503  WBC 15.8* 12.7* 12.7*  HGB 8.8* 8.2* 8.1*  HCT 26.4* 25.5* 24.9*  PLT 96* 100* 80*  CREATININE  --  0.57*  --    Medical History: Past Medical History  Diagnosis Date  . DVT (deep venous thrombosis) ([place]) dx'd ~ 08/22/2015    RLE  . Small bowel obstruction ([place]) 08/16/2015  . Abdominal mass 08/29/2015  . Headache     "weekly" (08/16/2015)  . Colon cancer ([place]) dx'd 09/07/2015    stage IV/notes 08/20/2015    Assessment/Plan:  Pharmacy consulted to review medications that may contribute to developing delirium. 54yo male admitted 12/14 w/ new ca dx and DVT, now developing delirium per RN.  RN Izora Gala reports anxiety to the point that the pt does not know where he is.  She was pt's RN last week and he was coherent at that time and day RN did not report any acute mental changes.  Currently on:   Current facility-administered medications:  .  acetaminophen (TYLENOL) tablet 1,000 mg, 1,000 mg, Oral, TID, Michael Boston, MD, 1,000 mg at 09/05/15 2129 .  alum & mag hydroxide-simeth (MAALOX/MYLANTA) 200-200-20 MG/5ML suspension 30 mL, 30 mL, Oral, Q6H PRN, Michael Boston, MD .  bismuth subsalicylate (PEPTO BISMOL) 262 MG/15ML suspension 30 mL, 30 mL, Oral, Q8H PRN, Michael Boston, MD .  dextrose 5 % and 0.45 % NaCl with KCl 20 mEq/L infusion, , Intravenous, Continuous, Blane Ohara McDiarmid, MD, Last Rate: 125 mL/hr at 09/05/15 2127 .  feeding supplement (ENSURE ENLIVE) (ENSURE ENLIVE) liquid 237 mL, 237 mL, Oral, BID BM, Michael Boston, MD, 237 mL at 09/05/15 1000 .  HYDROmorphone (DILAUDID) tablet 2-4 mg, 2-4 mg, Oral, Q4H PRN, Georganna Skeans, MD, 2 mg at 09/05/15 2301 .  lip balm (BLISTEX) ointment, , Topical, BID, Kinnie Feil, MD .  loperamide (IMODIUM) capsule 2-4 mg, 2-4 mg,  Oral, Q8H PRN, Michael Boston, MD .  LORazepam (ATIVAN) tablet 0.5 mg, 0.5 mg, Oral, Q6H PRN, Knox Royalty, NP, 0.5 mg at 09/05/15 1517 .  magic mouthwash, 15 mL, Oral, QID PRN, Michael Boston, MD .  menthol-cetylpyridinium (CEPACOL) lozenge 3 mg, 1 lozenge, Oral, PRN, Michael Boston, MD .  methocarbamol (ROBAXIN) 1,000 mg in dextrose 5 % 50 mL IVPB, 1,000 mg, Intravenous, Q8H PRN, [name], PA-C, 1,000 mg at 09/03/15 2015 .  morphine 2 MG/ML injection 1 mg, 1 mg, Intravenous, Q3H PRN, Katheren Shams, DO, 1 mg at 09/05/15 2129 .  ondansetron (ZOFRAN) injection 4 mg, 4 mg, Intravenous, Q6H PRN, Katheren Shams, DO .  phenol (CHLORASEPTIC) mouth spray 1 spray, 1 spray, Mouth/Throat, PRN, Johnathan Hausen, MD .  Rivaroxaban (XARELTO) tablet 15 mg, 15 mg, Oral, BID WC, Blane Ohara McDiarmid, MD, 15 mg at 09/05/15 1718 .  sodium chloride 0.9 % injection 10-40 mL, 10-40 mL, Intracatheter, Q12H, Kinnie Feil, MD, 10 mL at 09/02/15 2110 .  sodium chloride 0.9 % injection 10-40 mL, 10-40 mL, Intracatheter, PRN, Kinnie Feil, MD, 10 mL at 09/04/15 0453   Of these, hydromorphone, loperamide, lorazepam, and morphine have the potential to cause delirium.  Loperamide has not been charted. Pt switched from OxyContin/oxyIR to Dilaudid 12/24 and rec'd three doses over 12 hours; this would be the most likely culprit though RN reports pt  was on Dilaudid PCA last week without issue.  RN also reports that until this pm Ativan worked well for pt with no improvement w/ doses given late 12/24.  No specific recommendations.  May attempt to change benzo and/or opioid if no improvement.  Wynona Neat, PharmD, BCPS  09/06/2015,1:25 AM

## 2015-09-06 NOTE — Progress Notes (Signed)
FPTS Interim Progress Note  S: Called to room to evaluate patient per RN request. RN concerned about patient's confusion and vitals. He has been tachycardic and tachypnic since around 9pm last night.   When I went to evaluate patient he is confused about orientation questions but otherwise responses are appropriate. States he has been unable to sleep due to pain. Believes something is going on in his abdomen. He endorses no other symptoms. No CP, SOB, dysuria.  O: BP 118/67 mmHg  Pulse 111  Temp(Src) 97.8 F (36.6 C) (Oral)  Resp 36  Ht 5\' 8"  (1.727 m)  Wt 192 lb 1.6 oz (87.136 kg)  BMI 29.22 kg/m2  SpO2 92%   Gen: alert, mildly distressed, ill-appearing Lungs: CTAB, normal WOB, no wheezes or rales Cardiac: tachycardic  Abd: tender to palpation diffusely but more pain in RLQ Neuro: A&Ox1, non-focal.  Psych: mood and affect appropriate. No signs of agitation on my exam. Confused about location but responses to questions appropriate.   A/P: AMS with tachycardia and tachypnea: Believe patient's symptoms are coming from pain at this time. His regimen was changed earlier today by Gen surg to only prn medications. He has not been on any scheduled medications for pain yesterday. Changed his regimen to 4mg  PO Dilaudid scheduled q6hrs with morphine IV breakthrough. Oxycodone was discontinued due to nausea and side effects.  Some concern for possible PE but no further work-up needed as it will not change treatment course as patient already on treatment dose of Xarelto for anticoagulation of DVTs.   Patient receiving IVF. Is up 10L. No need for fluid bolus at this time. BPs are stable. No fevers. Patient checked rectal temp in room and was 100.24F. Will obtain CXR, UA, and lactic acid to see about potential infectious sources. Electrolytes have been stable over the past few days but will obtain repeat this morning. Bladder scan by nurse at bedside with only about 75mL.   Unfortunately all this  could be due to worsening of metastasis from his cancer possibly with brain mets now causing AMS. Will leave to morning team if they want to do head imaging. Patient otherwise stable.    Katheren Shams, DO 09/06/2015, 3:09 AM PGY-2, [facility] Service pager [phone]

## 2015-09-06 NOTE — Progress Notes (Signed)
Family Medicine Teaching Service Daily Progress Note Intern Pager: [phone]  Patient name: Jesse Murray Medical record number: [id number] Date of birth: July 18, 1961 Age: 54 y.o. 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 male presenting with abdominal pain, as well as bilateral leg swelling and pain. PMH is significant for EtOH abuse.   AMS: Etiology unknown. Possible due to HAP (see below) vs Delirium 2/2 pain vs PE vs ? Mets to Brain  - Discontinued: Robaxin  - CT Angio Chest: ordered. Already on Xarelto for DVTs - Scheduled Pain meds (as below) to prevent withdrawal  - Haldol 1mg  q6hrs prn  - Ativan 0.5 mg qd.  - foley placed - Pharm consulted - UA: Ordered  ID: Likely HAP Developed fevers (102.4) and AMS on 12/24 w/ CXR = ? PNA. Possible GI source given recent surgery,  Lactic acid wnl 12/24. - WBC 16.8 (12/25) > 12.7 (12/24) - Oxygen Demand: [place] @ 4L - BCx: Ordered after ABx given - ABx: Vanc/Zosyn - Day 2 (12/24 >> - CT Ab: Ordered  Elevated ALP and Bili: Question if related to Liver / Gallbladder as AST mildly elevated and ALT wnl - Fractionated bili: Ordered - Gamma GT: Ordered - PT/INR, PTT: Ordered - Recheck CMET in AM  Abdominal Pain 2/2 Stage IV GI CA (Likely colorectal  Vs pancreatic) invading hepatic flexure, with bowel obstruction: CT abdomen/pelvis showing metastatic colon cancer at hepatic flexure with hepatic mets, nodal mets, left adrenal mets. Ex-lap 12/16 found primary  to be most likely pancreatic that is invading hepatic flexure; unresectable but ileostomy was done to help alleviate symptoms.   - Pain control: hydrocodone 2 mg IV q6 hrs. Consider decreasing oxy IR back to 10 mg to improve nausea.  - Palliative care continuing to follow pt - appreciate recs  - PT/OT consulted - Wound care following  - Per surgery, consider imodium if stools in ostomy continue to be loose  Leg pain/swelling likely 2/2 bilateral DVTs: Most likely DVT due to hypercoagulable state from malignancy and/or recent inactivity after lying in bed for three weeks. Well's criteria for DVT 7 (high risk for DVT). Well's criteria for PE 5.5 (moderate risk). Physical exam also consistent with DVT. Patient transitioned from heparin to Xarelto for long-term anticoagulation on 12/22.  - CTA chest: ordered - Continue Xarelto  Pressure ulcer: Stage II in gluteal cleft with serosanguinous drainage. Patient at increased risk due to malnutrition, relative immobility, and increased moisture. Foam island added 12/23.  - Wound care continuing to follow - will change foam island q3 days - Prescribed pressure-distributing chair cushion  Anemia: Hgb 9.7 on admission (12.3 one month ago). No signs of bleeding, so potentially just hemodilution as all cell lines have decreased. Also potentially related to malignancy. FOBT negative. Vitals are stable. PT elevated at 32, APTT elevated at 81. INR improved to 1.3.  - Hgb stable 8.3 (12/25); 9.3 (12/21) after 2u pRBCs.    - Daily H&H - Transfusion threshold <7  Thrombocytopenia: Plts stable 84 (12/25) from 80 (12/24); 185 on admit (  12/14). 4T score = 1; unlikely HIT. Transitioned from heparin to Xarelto 12/22.  - Continue to monitor   AKI: Resolved. Unsure of baseline Cr but one month ago was 0.69. On admission 1.31. Patient did receive IV contrast in ED for CT scan. Also with decreased PO intake likely a prerenal etiology as well. BUN/Cr ratio >20. Cr improved  to 0.57 (12/23).  - Avoid nephrotoxic agents including contrast - I/Os  Hypotension: Resolved. Most likely due to volume losses from high ileostomy output and inadequate PO intake (patient off TPN now but only eating 25% of meals).  - IVF as below - Encourage increased PO intake  Social: Patient has agreed to SNF placement with goal of regaining strength to became candidate for palliative chemo. Does have sister coming to stay with him on 12/26 that could potentially help long-term.  - SW Consulted: Working on SNF placement  FEN/GI: reg diet,  D5 1/2NS + KCl 20 @125  mL/hr Prophylaxis: Xarelto  Dispo: SNF pending stabilization of BP with PO intake alone  Subjective:  Complains of bilateral leg pain but no worse than prior. Continues to have diffuse abdominal pain but no worse than yesterday. Denies CP or SOB.   Objective: Temp:  [97.8 F (36.6 C)-102.4 F (39.1 C)] 99 F (37.2 C) (12/25 0800) Pulse Rate:  [100-130] 124 (12/25 0800) Resp:  [20-36] 30 (12/25 0800) BP: (102-136)/(62-83) 136/78 mmHg (12/25 0800) SpO2:  [91 %-100 %] 96 % (12/25 0800) Weight:  [186 lb 1.6 oz (84.414 kg)] 186 lb 1.6 oz (84.414 kg) (12/25 0800) Physical Exam: General: ill-appearing, cachectic, lying in bed in NAD Neuro: Alert to person and place, not time Cardiovascular: Tachycardia w/ RR, no murmurs appreciated Respiratory: CTAB, no wheezes Abdomen: +BS, mildly TTP diffusely Extremities: 1+ pitting edema to knee on RLE with 3+ pitting edema of foot to ankle; 3+ edema of L foot to ankle, 1+ pitting edema to knee of LLE,  Laboratory:  Recent Labs Lab 09/04/15 0453 09/05/15 0503 09/06/15 0404  WBC 12.7* 12.7* 16.8*  HGB 8.2* 8.1* 8.3*  HCT 25.5* 24.9* 25.3*  PLT 100* 80* 84*    Recent Labs Lab 08/31/15 0441  09/02/15 0600 09/04/15 0453 09/06/15 0404  NA 139  < > 131* 134* 135  K 3.3*  < > 3.8 4.2 4.1  CL 111  < > 102 103 104  CO2 23  < > 22 23 21*  BUN 8  < > 10 8 <5*  CREATININE  0.70  < > 0.56* 0.57* 0.66  CALCIUM 7.6*  < > 7.8* 7.8* 7.8*  PROT 4.8*  --   --   --  4.9*  BILITOT 1.6*  --   --   --  3.9*  ALKPHOS 425*  --   --   --  1099*  ALT 15*  --   --   --  24  AST 26  --   --   --  48*  GLUCOSE 123*  < > 113* 76 112*  < > = values in this interval not displayed.  Imaging/Diagnostic Tests: Dg Chest Port 1 View  09/06/2015  CLINICAL DATA:  Acute onset of fever and confusion. Initial encounter. EXAM: PORTABLE CHEST 1 VIEW COMPARISON:  Chest radiograph performed 08/29/2015 FINDINGS: Mild bibasilar opacities may reflect atelectasis or mild pneumonia. No pleural effusion or pneumothorax is seen. The cardiomediastinal silhouette is normal in size. No acute osseous abnormalities are identified. A right PICC is noted ending about the distal SVC.  IMPRESSION: Mild bibasilar airspace opacities may reflect atelectasis or mild pneumonia. Electronically Signed   By: Garald Balding M.D.   On: 09/06/2015 06:13    Olam Idler, MD 09/06/2015, 10:42 AM PGY-3, [facility] Intern pager: [phone], text pages welcome

## 2015-09-06 NOTE — Progress Notes (Addendum)
While rounding on Pt at 0545, Patient found sitting on bedside commode with staff member assist. Pt severely agitated with very weak bilateral lower extremities. Attempting to hit and kick staff. Assisted back to bed with 4 assist.  BP 133/83 HR 125-135 RR 25-30, Po2 94-95% on 2 LNC, rectal temp 101.7 Cooling measures with ice initiated. Resident paged, orders for tylenol, retraints and haldol received. 2 mg haldol given IVP, Tylenol given PR, restraints placed for Pt safety. After incontinent episode, condom catheter placed and linens changed. Resident to bedside at 0640, updated on Pt status including labs, and xray results in Epic. Agreed to transfer Pt to SDU pending Day shift Resident team evaluation at 0730 this AM. Pt left resting in bed lethargic but arousable, confused. Floor RN to monitor closely. Day shift RRT RN to follow up this AM.

## 2015-09-06 NOTE — Progress Notes (Signed)
Pt received in bed from transfer unit pt to 2C01

## 2015-09-06 NOTE — Progress Notes (Signed)
Patient combative, trying to get OOB;wants to go home.   Very unsteady on his feet and confused.  Doesn't know he is in the hospital.  Order obtained for bilateral wrist restraints.  No family available to inform about restraint placement.

## 2015-09-06 NOTE — Progress Notes (Signed)
Pt seen earlier this evening on rounds.Jesse Gala  RN concerned about Pt confusion, tachycardia HR  100s, tachypnea  RR 20-30, and anxiety. Pt found resting in bed, anxious but did not appear in respiratory distress. Lung sounds clear.  Placed on 2 LNC, 91-93% on RA. Advised RN to notify Resident on call, and to monitor closely. RN advised to notify myself for worsening condition as well tonight.   Follow up call placed to [name] at 0230 concerning Pt status. Per floor RN Patient received additional 1 mg ativan dose earlier this evening per order Dr. Sheppard Evens Ativan PO dose tonight 1.5 mg, with 2mg  IV Morphine total dose, and 2mg  PO dilaudid total dose. Per floor RN Pt remains anxious with no improvement after meds, Po2 92% on 2 LNC, HR 110s, BP 118/67, RR 36. Pt also now confused. Advised RN to notify Resident Dr. [name] and request a bedside evaluation. Upon my arrival at 68 Dr. [name] also at bedside. Pt found resting in bed, with periods of rest and anxiety, HR 115-120. RR 20s Pt does not appear to be in respiratory distress. Rectal temp obtained yielding 100.4. Pt confused but answers questions fairly appropriate, complains of severe pain in ABD. CBC, BMET, Lactic acid, CXR and UA ordered for this am. New orders also placed for scheduled dilaudid to control pain. RN advised to monitor Pt closely and follow labs. Will follow and place patient on RRT Radar list.

## 2015-09-06 NOTE — Progress Notes (Signed)
I have seen and examined this patient. I have reviewed labs, test and imaging results. I viewed pCXR.  I have discussed with Dr [name].  I agree with the resident's findings, assessment and care plan as detailed in their progress note.   SH:  History  Smoking status  . Never Smoker   Smokeless tobacco  . Never Used   Past Medical History  Diagnosis Date  . DVT (deep venous thrombosis) ([place]) dx'd ~ 08/22/2015    RLE  . Small bowel obstruction ([place]) 08/18/2015  . Abdominal mass 09/01/2015  . Headache     "weekly" (08/16/2015)  . 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  - Tachypnea, Tachycardic, restlessness - Febrile Tmax 102.4, Tcurrent 99.0, SaO2 96% [place] - PE significant for somnolence with arousal to voice (Pt had received 2 mg Haldol IV 20 mminutes before exam), follow one-step commands, Focuses on face, gave social greeting then fell back asleep. In soft wrist restraints.  No crackles or wheezes anteriorly; Decrease Bowel sound, Incr Tympany x 4 quad without distention, no grimacing with palpation x 4 quad. Intact surgical incision site.  Stoma pink.  Condom cath in place with yellow urine in bag - WBC 16.8K (12.7K day prior), Hgb 8.3 (stable), Alk Phos 1099 U/L (425 U/L 08/31/15, 199 U/L 07/14/15); Total Bili 3.9 (TB 1.6 12/19, 0.8 07/14/15); Lactic Acid 1.7 Anion Gap 10, HCO3 21 - Urine collection pending for Urinalysis and culture.  - pCXR: bibasilar ASD - Working explanation of acute delirium of an acute infectious/inflammatory process in patient with predisposition to delirium from multpile medications including psychotropics, opiates, metastatic disease, debility.  Other contributors could include lowering of the patient's daily 240 mg oral Morphine Equivalent Daily dose including scheduled opiates to as  needed scheduling.  - Ddx acute shortness of breath with fever: Pneumonia (HCAP), Pulmonary embolism, Sepsis from intra-abdominal infectious process, urinary tract infection  - Plan:  - Soft wrist restraints to protect patient from injury to self and prevent patient interference equipment necessary to deliver necessary care.  - BS antibiotics for possible HCAP - Oxygen supplementation to keep Sats > 90%.  - IV fluids at maintenance with bolus as needed for low BP - Restart a scheduled opiate therapy at ~ 50% of usual dose to prevent withdrawal syndrom.  - Haldol prn agitation to prevent patient from injury to self and prevent patient interference equipment necessary to deliver necessary care.  Keep to lowest dose possible for shortest period possible.  - Reduce usual dose of Lorazepam to 0.5 mg IV daily to avoid withdrawal syndrome.  - CTA chest toconfirm suspected pneumonia and to look for evidence of PE;  CT abdomen/pelvis w/contrast to assess for infectious intraabdominal process.  - Lab work to assess liver function, add specificity to elevated Alk Phos, and fractionate Tbili to assess for possbile intrar or extra-hepatic cholestasis - Patient is a full code. Transfer to Step-down unit is appropriate.  If unable to keep BP with MAP > 65 mmHg, then consult with CCM for possible vasopressors and need for intubation.

## 2015-09-06 NOTE — Progress Notes (Signed)
Report given to [place] on [place].  Patient ready for transfer.

## 2015-09-06 NOTE — Progress Notes (Signed)
Patient confused,restless,removing his gown, telemetry, and oxygen.  Unable to tell me where he is . Complaining that his intestine hurts. Medicated him with 1 mg IV morphine.  Pt is tachypneic and tachycardic, afebrile.  Spoke with Jerene Pitch, rapid response RN/. Notified Dr. [name].

## 2015-09-06 NOTE — Progress Notes (Signed)
Dr. Berkley Harvey returned page regarding PT/INR. No orders at this time. Advised that Xarelto 5 pm dose not given.

## 2015-09-06 NOTE — Progress Notes (Signed)
ANTIBIOTIC CONSULT NOTE - INITIAL  Pharmacy Consult for vancomycin and zosyn Indication: HCAP  No Known Allergies  Patient Measurements: Height: 5\' 8"  (172.7 cm) Weight: 192 lb 1.6 oz (87.136 kg) IBW/kg (Calculated) : 68.4  Vital Signs: Temp: 102.4 F (39.1 C) (12/25 0700) Temp Source: Axillary (12/25 0700) BP: 120/69 mmHg (12/25 0700) Pulse Rate: 130 (12/25 0700) Intake/Output from previous day: 12/24 0701 - 12/25 0700 In: 3514.2 [P.O.:360; I.V.:3154.2] Out: 1925 [Urine:1575; Stool:350] Intake/Output from this shift:    Labs:  Recent Labs  09/04/15 0453 09/05/15 0503 09/06/15 0404  WBC 12.7* 12.7* 16.8*  HGB 8.2* 8.1* 8.3*  PLT 100* 80* 84*  CREATININE 0.57*  --  0.66   Estimated Creatinine Clearance: 114.6 mL/min (by C-G formula based on Cr of 0.66). No results for input(s): VANCOTROUGH, VANCOPEAK, VANCORANDOM, GENTTROUGH, GENTPEAK, GENTRANDOM, TOBRATROUGH, TOBRAPEAK, TOBRARND, AMIKACINPEAK, AMIKACINTROU, AMIKACIN in the last 72 hours.   Microbiology: No results found for this or any previous visit (from the past 720 hour(s)).  Medical History: Past Medical History  Diagnosis Date  . DVT (deep venous thrombosis) ([place]) dx'd ~ 08/22/2015    RLE  . Small bowel obstruction ([place]) 08/25/2015  . Abdominal mass 09/02/2015  . Headache     "weekly" (09/10/2015)  . 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 Pharmacy consulted to dose vanc/zosyn for HCAP.  T max 102.4,  WBC 16.8, creat 0.66, lactate WNL 12/25 CXR: mild bibasilar airspace opacities may reflect atelectasis or mild PNA vanc 12/25>> Zosyn 12/25>>  12/25 MRSR PCR  Goal of Therapy:  Vancomycin trough level 15-20 mcg/ml  Plan:  - zosyn 3.375 gm IV x 1 dose over 30 minutes followed by zosyn 3.375 gm IV q8h, infuse  each dose over 4 hours - vancomycin 1 gm IV q8h - f/u renal fxn, wbc, temp, culture data, CXR - vanc levels as needed  Eudelia Bunch, Pharm.D. BP:[id number] 09/06/2015 8:45 AM

## 2015-09-07 LAB — CBC WITH DIFFERENTIAL/PLATELET
BASOS PCT: 0 %
Basophils Absolute: 0 10*3/uL (ref 0.0–0.1)
EOS PCT: 3 %
Eosinophils Absolute: 0.5 10*3/uL (ref 0.0–0.7)
HCT: 21.1 % — ABNORMAL LOW (ref 39.0–52.0)
HEMOGLOBIN: 6.9 g/dL — AB (ref 13.0–17.0)
LYMPHS ABS: 2 10*3/uL (ref 0.7–4.0)
Lymphocytes Relative: 12 %
MCH: 28.8 pg (ref 26.0–34.0)
MCHC: 32.7 g/dL (ref 30.0–36.0)
MCV: 87.9 fL (ref 78.0–100.0)
MONO ABS: 0.7 10*3/uL (ref 0.1–1.0)
Monocytes Relative: 4 %
Neutro Abs: 13.5 10*3/uL — ABNORMAL HIGH (ref 1.7–7.7)
Neutrophils Relative %: 81 %
PLATELETS: 79 10*3/uL — AB (ref 150–400)
RBC: 2.4 MIL/uL — AB (ref 4.22–5.81)
RDW: 17.3 % — ABNORMAL HIGH (ref 11.5–15.5)
WBC: 16.7 10*3/uL — AB (ref 4.0–10.5)

## 2015-09-07 LAB — COMPREHENSIVE METABOLIC PANEL
ALK PHOS: 874 U/L — AB (ref 38–126)
ALT: 27 U/L (ref 17–63)
ANION GAP: 9 (ref 5–15)
AST: 55 U/L — ABNORMAL HIGH (ref 15–41)
Albumin: 1.4 g/dL — ABNORMAL LOW (ref 3.5–5.0)
BUN: <5 mg/dL — ABNORMAL LOW (ref 6–20)
CALCIUM: 6.9 mg/dL — AB (ref 8.9–10.3)
CHLORIDE: 98 mmol/L — AB (ref 101–111)
CO2: 20 mmol/L — AB (ref 22–32)
Creatinine, Ser: 0.81 mg/dL (ref 0.61–1.24)
GFR calc non Af Amer: >60 mL/min (ref >60)
Glucose, Bld: 395 mg/dL — ABNORMAL HIGH (ref 65–99)
POTASSIUM: 3.5 mmol/L (ref 3.5–5.1)
SODIUM: 127 mmol/L — AB (ref 135–145)
Total Bilirubin: 4.8 mg/dL — ABNORMAL HIGH (ref 0.3–1.2)
Total Protein: 4.8 g/dL — ABNORMAL LOW (ref 6.5–8.1)

## 2015-09-07 LAB — PROTIME-INR
INR: 3.88 — AB (ref 0.00–1.49)
PROTHROMBIN TIME: 37.1 s — AB (ref 11.6–15.2)

## 2015-09-07 LAB — HEMOGLOBIN AND HEMATOCRIT, BLOOD
HEMATOCRIT: 28.6 % — AB (ref 39.0–52.0)
HEMOGLOBIN: 9.3 g/dL — AB (ref 13.0–17.0)

## 2015-09-07 LAB — GAMMA GT: GGT: 427 U/L — ABNORMAL HIGH (ref 7–50)

## 2015-09-07 LAB — URINE CULTURE

## 2015-09-07 LAB — PREPARE RBC (CROSSMATCH)

## 2015-09-07 MED ORDER — SODIUM CHLORIDE 0.9 % IV SOLN
Freq: Once | INTRAVENOUS | Status: AC
  Administered 2015-09-11: via INTRAVENOUS

## 2015-09-07 MED ORDER — RIVAROXABAN 20 MG PO TABS: 20.0000 mg | ORAL_TABLET | Freq: Every day | ORAL | Status: DC

## 2015-09-07 MED ORDER — CETYLPYRIDINIUM CHLORIDE 0.05 % MT LIQD
7.0000 mL | Freq: Two times a day (BID) | OROMUCOSAL | Status: DC
  Administered 2015-09-07 – 2015-09-12 (×9): 7 mL via OROMUCOSAL

## 2015-09-07 NOTE — Clinical Social Work Note (Signed)
Patient transferred to 2C01 handoff given to unit CSW, this CSW to sign off.  Jones Broom. Bertrand, MSW, [place] 09/07/2015 7:57 AM

## 2015-09-07 NOTE — Progress Notes (Signed)
Family Medicine Teaching Service Daily Progress Note Intern Pager: [phone]  Patient name: Jesse Murray Medical record number: [id number] Date of birth: 1961-05-08 Age: 54 y.o. 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 male presenting with abdominal pain, as well as bilateral leg swelling and pain. PMH is significant for EtOH abuse.   AMS: Etiology unknown. Possible due to HAP (see below) vs delirium 2/2 pain vs PE vs mets to brain. Robaxin discontinued and pain meds changed to scheduled Dilaudid to prevent withdrawal (patient not asking for PRN pain meds once altered). CTA showed small PEs which may be contributing. UA not suggestive of UTI. Improved today; oriented to person, place, time, and situation.  - Cont Haldol 1mg  q6hrs prn agitation and Ativan 0.5 mg qd   ID: Likely HAP Developed fevers (102.62F) and AMS on 12/24 w/ CXR = ? PNA. Possible GI source given recent surgery. Lactic acid WNL (12/24). WBC increased to 16.7 (12/26). Febrile again this AM at 101.5F.  - Oxygen Demand: [place] @ 4L - BCx: Ordered after ABx given; too young to read - ABx: Vanc/Zosyn - Day 2 (12/24 >>)  Elevated ALP and Bili: Question if related to liver/gallbladder as AST mildly elevated and ALT WNL. PT, PTT, INR, gamma GT elevated. CT abd showed already known stable appearance of heavy burden of hepatic metastatic disease but no new findings. T.bili increased to 4.1 today (12/26) from 3.9 (12/25).   - continue to monitor  Abdominal Pain 2/2 Stage IV GI CA (Likely colorectal  Vs pancreatic) invading hepatic flexure, with bowel obstruction: CT abdomen/pelvis showing metastatic colon cancer at hepatic flexure with hepatic mets, nodal mets, left adrenal mets. Ex-lap 12/16 found primary to be most likely pancreatic that is invading hepatic flexure; unresectable but ileostomy was done to help alleviate symptoms. Denies abdominal pain today.  - Pain control: Dilaudid 2 mg q6 hr scheduled - Palliative care continuing to follow pt - appreciate recs  - PT/OT consulted - Wound care following   Leg pain/swelling likely 2/2 bilateral DVTs: Most likely DVT due to hypercoagulable state from malignancy and/or recent inactivity after lying in bed for three weeks. Well's criteria for DVT 7 (high risk for DVT). Well's criteria for PE 5.5 (moderate risk). Physical exam also consistent with DVT. Patient transitioned from heparin to Xarelto for long-term anticoagulation on 12/22, however Xarelto beginning 12/25 given INR 6.77. CTA chest showed small pulmonary embolus as well as pulmonary edema with small bilateral pleural effusions. Pt persistently tachycardic (HR in low 100s/high 90s overnight) and tachypneic (avg RR mid 20s) on 4L O2 now. Reports improved pain today. - Continue to hold Xarelto  Pressure ulcer: Stage II in gluteal cleft with serosanguinous drainage. Patient at increased risk due to malnutrition, relative immobility, and increased moisture. Foam island added 12/23.  - Wound care continuing to follow - will change foam island q3 days - Prescribed pressure-distributing chair cushion  Anemia: Hgb 9.7 on admission (12.3 one month ago). No signs of bleeding. Also potentially related to malignancy. FOBT negative. Vitals  are stable. PT increased further to 37, APTT elevated at 64. INR improved but still elevated at 3.9. Hgb decreased to 6.9 this AM (12/26).  - Daily H&H - Transfusion threshold <7 -  Likely transfuse today  Thrombocytopenia: Plts stable 79 (12/26) from 67 (12/25); 185 on admit (12/14). 4T score = 1; unlikely HIT. Transitioned from heparin to Xarelto 12/22. Xarelto held beginning 12/25 given INR 6.77. - Continue to monitor   AKI: Resolved. Unsure of baseline Cr but one month ago was 0.69. On admission 1.31. Patient did receive IV contrast in ED for CT scan. Also with decreased PO intake likely a prerenal etiology as well. BUN/Cr ratio >20. Cr improved to 0.81 (12/26).  - Avoid nephrotoxic agents including contrast - I/Os  Hypotension: Resolved. Most likely due to volume losses from high ileostomy output and inadequate PO intake (patient off TPN now but only eating 25% of meals).  - Encourage increased PO intake  Social: Patient has agreed to SNF placement with goal of regaining strength to became candidate for palliative chemo. Does have sister coming to stay with him on 12/26 that could potentially help long-term.  - SW Consulted: Working on SNF placement  FEN/GI: reg diet Prophylaxis: Xarelto  Dispo: SNF pending stabilization of BP with PO intake alone  Subjective:  Patient reports improved pain today and denies confusion. He does not know if his sister is coming today or not. Is generally in better spirits this morning.   Objective: Temp:  [98.2 F (36.8 C)-102.2 F (39 C)] 101.6 F (38.7 C) (12/26 0752) Pulse Rate:  [98-103] 99 (12/26 0752) Resp:  [21-30] 26 (12/26 0752) BP: (104-116)/(58-70) 110/70 mmHg (12/26 0752) SpO2:  [95 %-100 %] 97 % (12/26 0752) Physical Exam: General: ill-appearing, cachectic, lying in bed in NAD Neuro: A&Ox4, no focal deficits  Cardiovascular: RRR, not tachycardic, no murmurs appreciated Respiratory: CTAB, no wheezes, did become SOB after sitting up in bed for auscultation Abdomen: +BS, soft, non-distended, non-tender Extremities: 1+ pitting edema to knee on RLE with 3+ pitting edema of foot to ankle; 3+ edema of L foot to  ankle, 1+ pitting edema to knee of LLE  Laboratory:  Recent Labs Lab 09/06/15 0404 09/06/15 1500 09/07/15 0210  WBC 16.8* 16.1* 16.7*  HGB 8.3* 7.6* 6.9*  HCT 25.3* 23.2* 21.1*  PLT 84* 67* 79*    Recent Labs Lab 09/04/15 0453 09/06/15 0404 09/06/15 1500 09/07/15 0210  NA 134* 135  --  127*  K 4.2 4.1  --  3.5  CL 103 104  --  98*  CO2 23 21*  --  20*  BUN 8 <5*  --  <5*  CREATININE 0.57* 0.66  --  0.81  CALCIUM 7.8* 7.8*  --  6.9*  PROT  --  4.9*  --  4.8*  BILITOT  --  3.9* 4.1* 4.8*  ALKPHOS  --  1099*  --  874*  ALT  --  24  --  27  AST  --  48*  --  55*  GLUCOSE 76 112*  --  395*    Imaging/Diagnostic Tests: Ct Angio Chest Pe W/cm &/or Wo Cm  09/06/2015  CLINICAL DATA:  Tachypnea. Bilateral leg DVT. History of colon cancer. Fever. Status post ileostomy. EXAM: CT ANGIOGRAPHY CHEST CT ABDOMEN AND PELVIS WITH CONTRAST TECHNIQUE: Multidetector CT imaging of the chest was performed using the standard protocol during bolus administration of intravenous contrast. Multiplanar CT image reconstructions and MIPs were obtained to evaluate the vascular  anatomy. Multidetector CT imaging of the abdomen and pelvis was performed using the standard protocol during bolus administration of intravenous contrast. CONTRAST:  126mL OMNIPAQUE IOHEXOL 350 MG/ML SOLN COMPARISON:  08/16/2015 FINDINGS: CTA CHEST FINDINGS There is a small embolus within right lower lobe subsegmental pulmonary artery. No large central pulmonary embolus is seen. There is a small pericardial effusion. The heart is normal in size. Atherosclerotic disease of the coronary arteries is seen. There are numerous mildly enlarged mediastinal lymph nodes the largest of which in right peritracheal location measures 18 mm in long-axis. A 9 mm lymph node is seen in the pericardial fat. There is anasarca. Evaluation of the lung parenchyma demonstrates bilateral pleural effusions and mild pulmonary edema. There is a 6 mm nodule in the  periphery of the right upper lobe. Smaller ground-glass nodules are seen in the right middle lobe. 5 mm soft tissue nodule is seen in the lingula. PICC line is seen with tip at the cavoatrial junction. CT ABDOMEN and PELVIS FINDINGS Again seen are numerous hepatic metastatic lesions, not significantly changed from the prior exam. Two sub cm hypoechoic lesions are seen in the periphery of the spleen. Again seen is left adrenal mass, measuring 2.1 cm. The pancreas, bilateral kidneys and right adrenal gland are normal. There is diffuse edema of the mesentery and a low-density free intra-abdominal fluid. There is right anterior abdominal wall ileostomy without evidence of upstream obstruction. Patient is status post right colectomy. The transverse colon demonstrates diffuse irregular wall thickening. The residual left colon is decompressed. Diffuse diverticulosis of the sigmoid is noted. There is no evidence of abdominal aortic aneurysm. The celiac, SMA, IMA, portal vein, SMV are opacified. Bilateral femoral veins are poorly opacified and may be thrombosed. The thrombosis extends into the right external iliac vein. There are enlarged retroperitoneal lymph nodes in the upper abdomen. There is no evidence of aggressive appearing osseous lesions. Review of the MIP images confirms the above findings. IMPRESSION: Small embolus within subsegmental right lower lobe pulmonary artery. Small pericardial effusion. Atherosclerotic disease of the coronary arteries. Mediastinal lymphadenopathy. Pulmonary edema with small bilateral pleural effusions. Bilateral sub cm pulmonary soft tissue nodules, concerning for metastatic disease. Stable appearance of heavy burden of hepatic metastatic disease. Two lesions within the spleen which may also be metastatic. Left adrenal mass. Status post right colectomy. Irregular thickening of the residual transverse colon, which may be malignant or infectious/ inflammatory. Ileostomy without evidence  of small-bowel obstruction. Bilateral femoral vein thrombosis with extension of the thrombus into the right external iliac vein. Free fluid within the abdomen. Anasarca. Electronically Signed   By: Fidela Salisbury M.D.   On: 09/06/2015 18:22   Ct Abdomen Pelvis W Contrast  09/06/2015  CLINICAL DATA:  Tachypnea. Bilateral leg DVT. History of colon cancer. Fever. Status post ileostomy. EXAM: CT ANGIOGRAPHY CHEST CT ABDOMEN AND PELVIS WITH CONTRAST TECHNIQUE: Multidetector CT imaging of the chest was performed using the standard protocol during bolus administration of intravenous contrast. Multiplanar CT image reconstructions and MIPs were obtained to evaluate the vascular anatomy. Multidetector CT imaging of the abdomen and pelvis was performed using the standard protocol during bolus administration of intravenous contrast. CONTRAST:  168mL OMNIPAQUE IOHEXOL 350 MG/ML SOLN COMPARISON:  08/16/2015 FINDINGS: CTA CHEST FINDINGS There is a small embolus within right lower lobe subsegmental pulmonary artery. No large central pulmonary embolus is seen. There is a small pericardial effusion. The heart is normal in size. Atherosclerotic disease of the coronary arteries is seen. There are numerous  mildly enlarged mediastinal lymph nodes the largest of which in right peritracheal location measures 18 mm in long-axis. A 9 mm lymph node is seen in the pericardial fat. There is anasarca. Evaluation of the lung parenchyma demonstrates bilateral pleural effusions and mild pulmonary edema. There is a 6 mm nodule in the periphery of the right upper lobe. Smaller ground-glass nodules are seen in the right middle lobe. 5 mm soft tissue nodule is seen in the lingula. PICC line is seen with tip at the cavoatrial junction. CT ABDOMEN and PELVIS FINDINGS Again seen are numerous hepatic metastatic lesions, not significantly changed from the prior exam. Two sub cm hypoechoic lesions are seen in the periphery of the spleen. Again seen  is left adrenal mass, measuring 2.1 cm. The pancreas, bilateral kidneys and right adrenal gland are normal. There is diffuse edema of the mesentery and a low-density free intra-abdominal fluid. There is right anterior abdominal wall ileostomy without evidence of upstream obstruction. Patient is status post right colectomy. The transverse colon demonstrates diffuse irregular wall thickening. The residual left colon is decompressed. Diffuse diverticulosis of the sigmoid is noted. There is no evidence of abdominal aortic aneurysm. The celiac, SMA, IMA, portal vein, SMV are opacified. Bilateral femoral veins are poorly opacified and may be thrombosed. The thrombosis extends into the right external iliac vein. There are enlarged retroperitoneal lymph nodes in the upper abdomen. There is no evidence of aggressive appearing osseous lesions. Review of the MIP images confirms the above findings. IMPRESSION: Small embolus within subsegmental right lower lobe pulmonary artery. Small pericardial effusion. Atherosclerotic disease of the coronary arteries. Mediastinal lymphadenopathy. Pulmonary edema with small bilateral pleural effusions. Bilateral sub cm pulmonary soft tissue nodules, concerning for metastatic disease. Stable appearance of heavy burden of hepatic metastatic disease. Two lesions within the spleen which may also be metastatic. Left adrenal mass. Status post right colectomy. Irregular thickening of the residual transverse colon, which may be malignant or infectious/ inflammatory. Ileostomy without evidence of small-bowel obstruction. Bilateral femoral vein thrombosis with extension of the thrombus into the right external iliac vein. Free fluid within the abdomen. Anasarca. Electronically Signed   By: Fidela Salisbury M.D.   On: 09/06/2015 18:22    Verner Mould, MD 09/07/2015, 9:53 AM PGY-1, [place] Intern pager: [phone], text pages welcome

## 2015-09-07 NOTE — Progress Notes (Signed)
  I spoke with Jesse Murray the patient's sister who just arrived from out of town today.  She plans to visit with her brother today and agrees to meet with me tomorrow at 1200 to discussed Jesse Murray current medical situation and plan of care.  Wadie Lessen NP  Palliative Medicine Team Team Phone # [phone] Pager [phone]

## 2015-09-07 NOTE — Progress Notes (Signed)
CRITICAL VALUE ALERT  Critical value received:  Hemoglobin 6.9  Date of notification:  09/07/2015   Time of notification:  2:58 AM   Critical value read back: yes  Nurse who received alert:  Reatha Armour RN  MD notified (1st page):  Berkley Harvey  Time of first page:  3:47 AM   MD notified (2nd page):  Time of second page: 4:08 AM, additionally at [facility]  Responding MD:    Time MD responded:

## 2015-09-07 NOTE — Progress Notes (Signed)
Family Medicine Teaching Service Daily Progress Note Intern Pager: [phone]  Patient name: Jesse Murray Medical record number: [id number] Date of birth: 04/30/1961 Age: 54 y.o. 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 male presenting with abdominal pain, as well as bilateral leg swelling and pain. PMH is significant for EtOH abuse.   AMS, resolved: Etiology unknown. Possible due to HAP (see below) vs delirium 2/2 pain vs PE vs mets to brain. Robaxin discontinued and pain meds changed to scheduled Dilaudid to prevent withdrawal (patient not asking for PRN pain meds once altered). CTA showed small PEs which may be contributing. UA not suggestive of UTI. Continues to be oriented to person, place, time, and situation. Has not required Haldol or PRN Ativan for agitation in >24 hours. - Discontinue Haldol and PRN Ativan - Continue Ativan 0.5 mg qd scheduled  ID: Likely HAP Developed fevers (102.48F) and AMS on 12/24 w/ CXR = ? PNA. Possible GI source given recent surgery. Lactic acid WNL (12/24). WBC improved to 16.2 (12/27). Afebrile overnight and this AM. Bcx with insignificant growth (but collected after beginning abx).   - Oxygen Demand: [place]@2 .5L - ABx: Vanc/Zosyn - Day 4 (12/24 >>)  Elevated ALP and Bili: Question if related to liver/gallbladder as AST mildly elevated and ALT WNL. PT, PTT, INR, gamma GT elevated. CT abd  showed already known stable appearance of heavy burden of hepatic metastatic disease but no new findings. T.bili 4.8 (12/26).  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 Ex-lap 12/16 found primary to be most likely pancreatic that is invading hepatic flexure; unresectable but ileostomy was done to help alleviate symptoms. Reported abdominal pain between doses of dilaudid last night and required extra dose. Endorses continued pain this AM.  - Pain control: Dilaudid 2 mg q6 hr scheduled - considering adjusting dose or adding an interval med given breakthrough pain - Palliative care continuing to follow pt - appreciate recs  - PT/OT consulted - Wound care following  - Now has scheduled Imodium per surgery due to increased ileostomy output  Leg pain/swelling likely 2/2 bilateral DVTs: Most likely DVT due to hypercoagulable state from malignancy and/or recent inactivity after lying in bed for three weeks. Well's criteria for DVT 7 (high risk for DVT). Well's criteria for PE 5.5 (moderate risk). Physical exam also consistent with DVT. Patient transitioned from heparin to Xarelto for long-term anticoagulation on 12/22, however Xarelto beginning 12/25 given INR 6.77. CTA chest showed small pulmonary embolus as well as pulmonary edema with small bilateral pleural effusions. Tachycardia improved this AM (HR in low 80s overnight) but still tachypneic with RR in low 20s. Decreased to 2.5L [employer] O2. - Consider resuming Xarelto given improving INR  Pressure ulcer: Stage II in gluteal cleft with serosanguinous drainage. Patient at increased risk  due to malnutrition, relative immobility, and increased moisture. Foam island added 12/23.  - Wound care continuing to follow - will change foam island q3 days -  Prescribed pressure-distributing chair cushion  Anemia: Hgb 9.7 on admission (12.3 one month ago). No signs of bleeding. Also potentially related to malignancy. FOBT negative. Vitals are stable. PT increased further to 37, APTT elevated at 64. INR improved but still elevated at 3.9. Pt received 2U PRBC yesterday. Hgb increased to 9.5 this AM (12/27).  - Daily H&H - Transfusion threshold <7  Thrombocytopenia: Plts improved at 87 (12/27) from 79 (12/26); 185 on admit (12/14). 4T score = 1; unlikely HIT. Transitioned from heparin to Xarelto 12/22. Xarelto held beginning 12/25 given INR 6.77.  - Continue to monitor  - Consider resuming Xarelto today given improving INR  AKI: Resolved. Unsure of baseline Cr but one month ago was 0.69. On admission 1.31. Patient did receive IV contrast in ED for CT scan. Also with decreased PO intake likely a prerenal etiology as well. BUN/Cr ratio >20. Cr improved to 0.76 (12/27).  - Avoid nephrotoxic agents including contrast - I/Os  Hypotension: Resolved. Most likely due to volume losses from high ileostomy output and inadequate PO intake (patient off TPN now but not eating and refusing to resume TPN). BP 100s/low 70s overnight.  - Encourage increased PO intake  Social: Patient has agreed to SNF placement with goal of regaining strength to became candidate for palliative chemo. Patient's sister scheduled to meet with palliative care today to discuss goals of care.  - SW Consulted: Working on SNF placement  FEN/GI: reg diet Prophylaxis: Xarelto  Dispo: SNF pending resolution of anemia and fever  Subjective:  Patient reports abdominal pain this AM. He had pain between doses of Dilaudid last night, and required an extra dose. Reports improved pain in legs. Has no other complaints.  Patient's sister arrived from out of town yesterday, and is scheduled to meet with palliative care today to discuss goals of care. Of note, patient is not eating, but is refusing to  resume TPN per discussion with surgery.   Objective: Temp:  [98.1 F (36.7 C)-100.6 F (38.1 C)] 98.7 F (37.1 C) (12/27 0752) Pulse Rate:  [81-98] 85 (12/27 0600) Resp:  [15-39] 26 (12/27 0752) BP: (89-118)/(55-80) 113/67 mmHg (12/27 0752) SpO2:  [84 %-98 %] 94 % (12/27 0600) Weight:  [175 lb 12.8 oz (79.742 kg)] 175 lb 12.8 oz (79.742 kg) (12/27 0100) Physical Exam: General: ill-appearing, cachectic, lying in bed in NAD Neuro: A&Ox4, no focal deficits  Cardiovascular: RRR, not tachycardic, no murmurs appreciated Respiratory: CTAB, no wheezes, became SOB after adjusting his position in bed Abdomen: +BS, soft, non-distended, non-tender Extremities: 1+ pitting edema to knee on RLE with 2+ pitting edema of foot to ankle; 2+ edema of L foot to ankle, 1+ pitting edema to knee of LLE  Laboratory:  Recent Labs Lab 09/06/15 1500 09/07/15 0210 09/07/15 2200 09/08/15 0503  WBC 16.1* 16.7*  --  16.2*  HGB 7.6* 6.9* 9.3* 9.5*  HCT 23.2* 21.1* 28.6* 29.5*  PLT 67* 79*  --  87*    Recent Labs Lab 09/06/15 0404 09/06/15 1500 09/07/15 0210 09/08/15 0503  NA 135  --  127* 136  K 4.1  --  3.5 3.5  CL 104  --  98* 106  CO2 21*  --  20* 22  BUN <5*  --  <5* <5*  CREATININE 0.66  --  0.81 0.76  CALCIUM 7.8*  --  6.9* 7.3*  PROT 4.9*  --  4.8*  --   BILITOT 3.9* 4.1* 4.8*  --   ALKPHOS 1099*  --  874*  --   ALT 24  --  27  --   AST 48*  --  55*  --   GLUCOSE 112*  --  395* 90    Imaging/Diagnostic Tests: No results found.  Verner Mould, MD 09/08/2015, 8:57 AM PGY-1, [place] Intern pager: [phone], text pages welcome

## 2015-09-07 NOTE — Progress Notes (Signed)
Patient ID: Jesse Murray, male   DOB: 1960-12-27, 54 y.o.   MRN: [id number]     [facility]      [address] [place], [place] [address]    Phone: [phone] FAX: [phone]     Subjective: No respiratory distress. supra therapeutic INR Poor PO intake.    Objective:  Vital signs:  Filed Vitals:   09/06/15 1742 09/06/15 2000 09/07/15 0030 09/07/15 0410  BP: 104/63 108/58 109/58 110/69  Pulse: 98     Temp: 98.2 F (36.8 C) 98.3 F (36.8 C) 99 F (37.2 C) 99.1 F (37.3 C)  TempSrc: Oral Oral Oral Oral  Resp: '24 26 24 30  '$ Height:      Weight:      SpO2: 98% 100% 95% 98%    Last BM Date: 09/05/15  Intake/Output   Yesterday:  12/25 0701 - 12/26 0700 In: 3166.7 [P.O.:280; I.V.:2186.7; IV Piggyback:700] Out: 2200 [Urine:2200] This shift:    I/O last 3 completed shifts: In: 6440.8 [P.O.:400; I.V.:5340.8; IV Piggyback:700] Out: 3100 [Urine:2900; Stool:200]     Physical Exam: General: Pt awake/alert/oriented and in no acute distress.  Abdomen: Soft. Nondistended. Mildly tender at incisions only. Midline incision c/d/i. RLQ stoma is pink and viable. No evidence of peritonitis. No incarcerated hernias.    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[place])   SBO (small bowel obstruction) from colon cancer s/p colectomy/ileostomy 08/29/2015   Bilateral leg edema   Malignant neoplasm of hepatic flexure (HCC)   Metastatic cancer ([place])   Delirium due to general medical condition   Colon cancer ([place])   Colostomy in place [facility])    Confusion   Fever   Tachypnea    Results:   Labs: Results for orders placed or performed during the hospital encounter of 08/24/2015 (from the past 48 hour(s))  CBC     Status: Abnormal   Collection Time: 09/06/15  4:04 AM  Result Value Ref Range   WBC 16.8 (H) 4.0 - 10.5 K/uL   RBC 2.89 (L) 4.22 - 5.81 MIL/uL   Hemoglobin 8.3 (L) 13.0 - 17.0 g/dL   HCT 25.3 (L) 39.0 - 52.0 %   MCV 87.5 78.0 - 100.0 fL   MCH 28.7 26.0 - 34.0 pg   MCHC 32.8 30.0 - 36.0 g/dL   RDW 16.8 (H) 11.5 - 15.5 %   Platelets 84 (L) 150 - 400 K/uL    Comment: CONSISTENT WITH PREVIOUS RESULT  Comprehensive metabolic panel     Status: Abnormal   Collection Time: 09/06/15  4:04 AM  Result Value Ref Range   Sodium 135 135 - 145 mmol/L   Potassium 4.1 3.5 - 5.1 mmol/L   Chloride 104 101 - 111 mmol/L   CO2 21 (L) 22 - 32 mmol/L   Glucose, Bld 112 (H) 65 - 99 mg/dL   BUN <5 (L) 6 - 20 mg/dL   Creatinine, Ser 0.66 0.61 - 1.24 mg/dL   Calcium 7.8 (L) 8.9 - 10.3 mg/dL   Total Protein 4.9 (L) 6.5 - 8.1 g/dL  Albumin 1.5 (L) 3.5 - 5.0 g/dL   AST 48 (H) 15 - 41 U/L   ALT 24 17 - 63 U/L   Alkaline Phosphatase 1099 (H) 38 - 126 U/L   Total Bilirubin 3.9 (H) 0.3 - 1.2 mg/dL   GFR calc non Af Amer >60 >60 mL/min   GFR calc Af Amer >60 >60 mL/min    Comment: (NOTE) The eGFR has been calculated using the CKD EPI equation. This calculation has not been validated in all clinical situations. eGFR's persistently <60 mL/min signify possible Chronic Kidney Disease.    Anion gap 10 5 - 15  Lactic acid, plasma     Status: None   Collection Time: 09/06/15  4:04 AM  Result Value Ref Range   Lactic Acid, Venous 1.7 0.5 - 2.0 mmol/L  MRSA PCR Screening     Status: None   Collection Time: 09/06/15  8:49 AM  Result Value Ref Range   MRSA by PCR NEGATIVE NEGATIVE    Comment:        The GeneXpert MRSA Assay (FDA approved for NASAL specimens only), is one component of a comprehensive MRSA colonization surveillance  program. It is not intended to diagnose MRSA infection nor to guide or monitor treatment for MRSA infections.   CBC with Differential/Platelet     Status: Abnormal   Collection Time: 09/06/15  3:00 PM  Result Value Ref Range   WBC 16.1 (H) 4.0 - 10.5 K/uL   RBC 2.66 (L) 4.22 - 5.81 MIL/uL   Hemoglobin 7.6 (L) 13.0 - 17.0 g/dL   HCT 23.2 (L) 39.0 - 52.0 %   MCV 87.2 78.0 - 100.0 fL   MCH 28.6 26.0 - 34.0 pg   MCHC 32.8 30.0 - 36.0 g/dL   RDW 17.0 (H) 11.5 - 15.5 %   Platelets 67 (L) 150 - 400 K/uL    Comment: REPEATED TO VERIFY SPECIMEN CHECKED FOR CLOTS CONSISTENT WITH PREVIOUS RESULT    Neutrophils Relative % 82 %   Neutro Abs 13.2 (H) 1.7 - 7.7 K/uL   Lymphocytes Relative 11 %   Lymphs Abs 1.7 0.7 - 4.0 K/uL   Monocytes Relative 6 %   Monocytes Absolute 0.9 0.1 - 1.0 K/uL   Eosinophils Relative 1 %   Eosinophils Absolute 0.2 0.0 - 0.7 K/uL   Basophils Relative 0 %   Basophils Absolute 0.1 0.0 - 0.1 K/uL  Bilirubin, fractionated(tot/dir/indir)     Status: Abnormal   Collection Time: 09/06/15  3:00 PM  Result Value Ref Range   Total Bilirubin 4.1 (H) 0.3 - 1.2 mg/dL   Bilirubin, Direct 2.4 (H) 0.1 - 0.5 mg/dL   Indirect Bilirubin 1.7 (H) 0.3 - 0.9 mg/dL  Urinalysis, Routine w reflex microscopic (not at [facility])     Status: Abnormal   Collection Time: 09/06/15  3:17 PM  Result Value Ref Range   Color, Urine AMBER (A) YELLOW    Comment: BIOCHEMICALS MAY BE AFFECTED BY COLOR   APPearance CLEAR CLEAR   Specific Gravity, Urine 1.014 1.005 - 1.030   pH 6.0 5.0 - 8.0   Glucose, UA NEGATIVE NEGATIVE mg/dL   Hgb urine dipstick NEGATIVE NEGATIVE   Bilirubin Urine MODERATE (A) NEGATIVE   Ketones, ur NEGATIVE NEGATIVE mg/dL   Protein, ur NEGATIVE NEGATIVE mg/dL   Nitrite NEGATIVE NEGATIVE   Leukocytes, UA NEGATIVE NEGATIVE    Comment: MICROSCOPIC NOT DONE ON URINES WITH NEGATIVE PROTEIN, BLOOD, LEUKOCYTES, NITRITE, OR GLUCOSE <1000 mg/dL.  Protime-INR  Status: Abnormal    Collection Time: 09/06/15  4:22 PM  Result Value Ref Range   Prothrombin Time 56.4 (H) 11.6 - 15.2 seconds   INR 6.77 (HH) 0.00 - 1.49    Comment: REPEATED TO VERIFY CRITICAL RESULT CALLED TO, READ BACK BY AND VERIFIED WITH: C WHITE,RN 1727 09/06/2015 WBOND   APTT     Status: Abnormal   Collection Time: 09/06/15  4:22 PM  Result Value Ref Range   aPTT 64 (H) 24 - 37 seconds    Comment:        IF BASELINE aPTT IS ELEVATED, SUGGEST PATIENT RISK ASSESSMENT BE USED TO DETERMINE APPROPRIATE ANTICOAGULANT THERAPY.   Comprehensive metabolic panel     Status: Abnormal   Collection Time: 09/07/15  2:10 AM  Result Value Ref Range   Sodium 127 (L) 135 - 145 mmol/L   Potassium 3.5 3.5 - 5.1 mmol/L    Comment: DELTA CHECK NOTED   Chloride 98 (L) 101 - 111 mmol/L   CO2 20 (L) 22 - 32 mmol/L   Glucose, Bld 395 (H) 65 - 99 mg/dL   BUN <5 (L) 6 - 20 mg/dL   Creatinine, Ser 0.81 0.61 - 1.24 mg/dL   Calcium 6.9 (L) 8.9 - 10.3 mg/dL   Total Protein 4.8 (L) 6.5 - 8.1 g/dL   Albumin 1.4 (L) 3.5 - 5.0 g/dL   AST 55 (H) 15 - 41 U/L   ALT 27 17 - 63 U/L   Alkaline Phosphatase 874 (H) 38 - 126 U/L   Total Bilirubin 4.8 (H) 0.3 - 1.2 mg/dL   GFR calc non Af Amer >60 >60 mL/min   GFR calc Af Amer >60 >60 mL/min    Comment: (NOTE) The eGFR has been calculated using the CKD EPI equation. This calculation has not been validated in all clinical situations. eGFR's persistently <60 mL/min signify possible Chronic Kidney Disease.    Anion gap 9 5 - 15  Gamma GT     Status: Abnormal   Collection Time: 09/07/15  2:10 AM  Result Value Ref Range   GGT 427 (H) 7 - 50 U/L  CBC with Differential/Platelet     Status: Abnormal   Collection Time: 09/07/15  2:10 AM  Result Value Ref Range   WBC 16.7 (H) 4.0 - 10.5 K/uL    Comment: WHITE COUNT CONFIRMED ON SMEAR   RBC 2.40 (L) 4.22 - 5.81 MIL/uL   Hemoglobin 6.9 (LL) 13.0 - 17.0 g/dL    Comment: REPEATED TO VERIFY CRITICAL RESULT CALLED TO, READ BACK BY  AND VERIFIED WITH: PETTIFORD,A RN 0257 12.26.16 MCADOO,G    HCT 21.1 (L) 39.0 - 52.0 %   MCV 87.9 78.0 - 100.0 fL   MCH 28.8 26.0 - 34.0 pg   MCHC 32.7 30.0 - 36.0 g/dL   RDW 17.3 (H) 11.5 - 15.5 %   Platelets 79 (L) 150 - 400 K/uL    Comment: SPECIMEN CHECKED FOR CLOTS PLATELET COUNT CONFIRMED BY SMEAR    Neutrophils Relative % 81 %   Lymphocytes Relative 12 %   Monocytes Relative 4 %   Eosinophils Relative 3 %   Basophils Relative 0 %   Neutro Abs 13.5 (H) 1.7 - 7.7 K/uL   Lymphs Abs 2.0 0.7 - 4.0 K/uL   Monocytes Absolute 0.7 0.1 - 1.0 K/uL   Eosinophils Absolute 0.5 0.0 - 0.7 K/uL   Basophils Absolute 0.0 0.0 - 0.1 K/uL   RBC Morphology POLYCHROMASIA PRESENT  WBC Morphology MILD LEFT SHIFT (1-5% METAS, OCC MYELO, OCC BANDS)   Protime-INR     Status: Abnormal   Collection Time: 09/07/15  2:15 AM  Result Value Ref Range   Prothrombin Time 37.1 (H) 11.6 - 15.2 seconds   INR 3.88 (H) 0.00 - 1.49    Imaging / Studies: Ct Angio Chest Pe W/cm &/or Wo Cm  09/06/2015  CLINICAL DATA:  Tachypnea. Bilateral leg DVT. History of colon cancer. Fever. Status post ileostomy. EXAM: CT ANGIOGRAPHY CHEST CT ABDOMEN AND PELVIS WITH CONTRAST TECHNIQUE: Multidetector CT imaging of the chest was performed using the standard protocol during bolus administration of intravenous contrast. Multiplanar CT image reconstructions and MIPs were obtained to evaluate the vascular anatomy. Multidetector CT imaging of the abdomen and pelvis was performed using the standard protocol during bolus administration of intravenous contrast. CONTRAST:  132m OMNIPAQUE IOHEXOL 350 MG/ML SOLN COMPARISON:  08/29/2015 FINDINGS: CTA CHEST FINDINGS There is a small embolus within right lower lobe subsegmental pulmonary artery. No large central pulmonary embolus is seen. There is a small pericardial effusion. The heart is normal in size. Atherosclerotic disease of the coronary arteries is seen. There are numerous mildly enlarged  mediastinal lymph nodes the largest of which in right peritracheal location measures 18 mm in long-axis. A 9 mm lymph node is seen in the pericardial fat. There is anasarca. Evaluation of the lung parenchyma demonstrates bilateral pleural effusions and mild pulmonary edema. There is a 6 mm nodule in the periphery of the right upper lobe. Smaller ground-glass nodules are seen in the right middle lobe. 5 mm soft tissue nodule is seen in the lingula. PICC line is seen with tip at the cavoatrial junction. CT ABDOMEN and PELVIS FINDINGS Again seen are numerous hepatic metastatic lesions, not significantly changed from the prior exam. Two sub cm hypoechoic lesions are seen in the periphery of the spleen. Again seen is left adrenal mass, measuring 2.1 cm. The pancreas, bilateral kidneys and right adrenal gland are normal. There is diffuse edema of the mesentery and a low-density free intra-abdominal fluid. There is right anterior abdominal wall ileostomy without evidence of upstream obstruction. Patient is status post right colectomy. The transverse colon demonstrates diffuse irregular wall thickening. The residual left colon is decompressed. Diffuse diverticulosis of the sigmoid is noted. There is no evidence of abdominal aortic aneurysm. The celiac, SMA, IMA, portal vein, SMV are opacified. Bilateral femoral veins are poorly opacified and may be thrombosed. The thrombosis extends into the right external iliac vein. There are enlarged retroperitoneal lymph nodes in the upper abdomen. There is no evidence of aggressive appearing osseous lesions. Review of the MIP images confirms the above findings. IMPRESSION: Small embolus within subsegmental right lower lobe pulmonary artery. Small pericardial effusion. Atherosclerotic disease of the coronary arteries. Mediastinal lymphadenopathy. Pulmonary edema with small bilateral pleural effusions. Bilateral sub cm pulmonary soft tissue nodules, concerning for metastatic disease.  Stable appearance of heavy burden of hepatic metastatic disease. Two lesions within the spleen which may also be metastatic. Left adrenal mass. Status post right colectomy. Irregular thickening of the residual transverse colon, which may be malignant or infectious/ inflammatory. Ileostomy without evidence of small-bowel obstruction. Bilateral femoral vein thrombosis with extension of the thrombus into the right external iliac vein. Free fluid within the abdomen. Anasarca. Electronically Signed   By: DFidela SalisburyM.D.   On: 09/06/2015 18:22   Ct Abdomen Pelvis W Contrast  09/06/2015  CLINICAL DATA:  Tachypnea. Bilateral leg DVT. History of colon  cancer. Fever. Status post ileostomy. EXAM: CT ANGIOGRAPHY CHEST CT ABDOMEN AND PELVIS WITH CONTRAST TECHNIQUE: Multidetector CT imaging of the chest was performed using the standard protocol during bolus administration of intravenous contrast. Multiplanar CT image reconstructions and MIPs were obtained to evaluate the vascular anatomy. Multidetector CT imaging of the abdomen and pelvis was performed using the standard protocol during bolus administration of intravenous contrast. CONTRAST:  170m OMNIPAQUE IOHEXOL 350 MG/ML SOLN COMPARISON:  08/14/2015 FINDINGS: CTA CHEST FINDINGS There is a small embolus within right lower lobe subsegmental pulmonary artery. No large central pulmonary embolus is seen. There is a small pericardial effusion. The heart is normal in size. Atherosclerotic disease of the coronary arteries is seen. There are numerous mildly enlarged mediastinal lymph nodes the largest of which in right peritracheal location measures 18 mm in long-axis. A 9 mm lymph node is seen in the pericardial fat. There is anasarca. Evaluation of the lung parenchyma demonstrates bilateral pleural effusions and mild pulmonary edema. There is a 6 mm nodule in the periphery of the right upper lobe. Smaller ground-glass nodules are seen in the right middle lobe. 5 mm  soft tissue nodule is seen in the lingula. PICC line is seen with tip at the cavoatrial junction. CT ABDOMEN and PELVIS FINDINGS Again seen are numerous hepatic metastatic lesions, not significantly changed from the prior exam. Two sub cm hypoechoic lesions are seen in the periphery of the spleen. Again seen is left adrenal mass, measuring 2.1 cm. The pancreas, bilateral kidneys and right adrenal gland are normal. There is diffuse edema of the mesentery and a low-density free intra-abdominal fluid. There is right anterior abdominal wall ileostomy without evidence of upstream obstruction. Patient is status post right colectomy. The transverse colon demonstrates diffuse irregular wall thickening. The residual left colon is decompressed. Diffuse diverticulosis of the sigmoid is noted. There is no evidence of abdominal aortic aneurysm. The celiac, SMA, IMA, portal vein, SMV are opacified. Bilateral femoral veins are poorly opacified and may be thrombosed. The thrombosis extends into the right external iliac vein. There are enlarged retroperitoneal lymph nodes in the upper abdomen. There is no evidence of aggressive appearing osseous lesions. Review of the MIP images confirms the above findings. IMPRESSION: Small embolus within subsegmental right lower lobe pulmonary artery. Small pericardial effusion. Atherosclerotic disease of the coronary arteries. Mediastinal lymphadenopathy. Pulmonary edema with small bilateral pleural effusions. Bilateral sub cm pulmonary soft tissue nodules, concerning for metastatic disease. Stable appearance of heavy burden of hepatic metastatic disease. Two lesions within the spleen which may also be metastatic. Left adrenal mass. Status post right colectomy. Irregular thickening of the residual transverse colon, which may be malignant or infectious/ inflammatory. Ileostomy without evidence of small-bowel obstruction. Bilateral femoral vein thrombosis with extension of the thrombus into the  right external iliac vein. Free fluid within the abdomen. Anasarca. Electronically Signed   By: DFidela SalisburyM.D.   On: 09/06/2015 18:22   Dg Chest Port 1 View  09/06/2015  CLINICAL DATA:  Acute onset of fever and confusion. Initial encounter. EXAM: PORTABLE CHEST 1 VIEW COMPARISON:  Chest radiograph performed 08/29/2015 FINDINGS: Mild bibasilar opacities may reflect atelectasis or mild pneumonia. No pleural effusion or pneumothorax is seen. The cardiomediastinal silhouette is normal in size. No acute osseous abnormalities are identified. A right PICC is noted ending about the distal SVC. IMPRESSION: Mild bibasilar airspace opacities may reflect atelectasis or mild pneumonia. Electronically Signed   By: JGarald BaldingM.D.   On: 09/06/2015 06:13  Medications / Allergies:  Scheduled Meds: . feeding supplement (ENSURE ENLIVE)  237 mL Oral BID BM  .  HYDROmorphone (DILAUDID) injection  2 mg Intravenous Q6H  . lip balm   Topical BID  . LORazepam  0.5 mg Intravenous Daily  . piperacillin-tazobactam (ZOSYN)  IV  3.375 g Intravenous Q8H  . Rivaroxaban  15 mg Oral BID WC  . sodium chloride  10-40 mL Intracatheter Q12H  . vancomycin  1,000 mg Intravenous Q8H   Continuous Infusions:  PRN Meds:.acetaminophen, acetaminophen, alum & mag hydroxide-simeth, bismuth subsalicylate, haloperidol lactate, loperamide, ondansetron (ZOFRAN) IV, sodium chloride  Antibiotics: Anti-infectives    Start     Dose/Rate Route Frequency Ordered Stop   09/06/15 1400  piperacillin-tazobactam (ZOSYN) IVPB 3.375 g     3.375 g 12.5 mL/hr over 240 Minutes Intravenous Every 8 hours 09/06/15 0841     09/06/15 1000  vancomycin (VANCOCIN) IVPB 1000 mg/200 mL premix     1,000 mg 200 mL/hr over 60 Minutes Intravenous Every 8 hours 09/06/15 0843     09/06/15 0900  piperacillin-tazobactam (ZOSYN) IVPB 3.375 g     3.375 g 100 mL/hr over 30 Minutes Intravenous  Once 09/06/15 0841 09/06/15 1000   08/27/2015 0815  ceFAZolin  (ANCEF) IVPB 2 g/50 mL premix     2 g 100 mL/hr over 30 Minutes Intravenous  Once 08/22/2015 0804 09/02/2015 1145   09/01/2015 0815  metroNIDAZOLE (FLAGYL) IVPB 500 mg    Comments:  OR holding area   500 mg 100 mL/hr over 60 Minutes Intravenous  Once 09/06/2015 0804 08/15/2015 0919        Assessment/Plan Stage IV adenocarcinoma of colon POD#10 exploratory laparotomy, partial colectomy, ileostomy---Dr. Ninfa Linden  -continue to encourage POs, supplement shakes -ileostomy, output is stable -DC staples on POD#14 ID-Vanc/zosyn for PNA. CT a/p without post op complications. DVT/PE-Xarelto PCM-Poor PO intake, albumin 1.4. Start calorie count, may need TF Dispo-SDU   Erby Pian, ANP-BC [facility]   09/07/2015 7:45 AM

## 2015-09-08 LAB — PROTIME-INR
INR: 3.24 — AB (ref 0.00–1.49)
Prothrombin Time: 32.4 seconds — ABNORMAL HIGH (ref 11.6–15.2)

## 2015-09-08 LAB — TYPE AND SCREEN
ABO/RH(D): O POS
Antibody Screen: NEGATIVE
Unit division: 0
Unit division: 0

## 2015-09-08 LAB — CBC
HEMATOCRIT: 29.5 % — AB (ref 39.0–52.0)
Hemoglobin: 9.5 g/dL — ABNORMAL LOW (ref 13.0–17.0)
MCH: 27.9 pg (ref 26.0–34.0)
MCHC: 32.2 g/dL (ref 30.0–36.0)
MCV: 86.5 fL (ref 78.0–100.0)
Platelets: 87 10*3/uL — ABNORMAL LOW (ref 150–400)
RBC: 3.41 MIL/uL — AB (ref 4.22–5.81)
RDW: 17.1 % — ABNORMAL HIGH (ref 11.5–15.5)
WBC: 16.2 10*3/uL — AB (ref 4.0–10.5)

## 2015-09-08 LAB — BASIC METABOLIC PANEL
ANION GAP: 8 (ref 5–15)
BUN: <5 mg/dL — ABNORMAL LOW (ref 6–20)
CO2: 22 mmol/L (ref 22–32)
Calcium: 7.3 mg/dL — ABNORMAL LOW (ref 8.9–10.3)
Chloride: 106 mmol/L (ref 101–111)
Creatinine, Ser: 0.76 mg/dL (ref 0.61–1.24)
GFR calc Af Amer: >60 mL/min (ref >60)
GFR calc non Af Amer: >60 mL/min (ref >60)
GLUCOSE: 90 mg/dL (ref 65–99)
POTASSIUM: 3.5 mmol/L (ref 3.5–5.1)
Sodium: 136 mmol/L (ref 135–145)

## 2015-09-08 LAB — VANCOMYCIN, TROUGH: Vancomycin Tr: 20 ug/mL (ref 10.0–20.0)

## 2015-09-08 MED ORDER — HYDROMORPHONE HCL 1 MG/ML IJ SOLN
2.0000 mg | INTRAMUSCULAR | Status: DC
  Administered 2015-09-08 – 2015-09-10 (×12): 2 mg via INTRAVENOUS
  Filled 2015-09-08 (×12): qty 2

## 2015-09-08 MED ORDER — DEXTROSE-NACL 5-0.45 % IV SOLN
INTRAVENOUS | Status: DC
  Administered 2015-09-08 – 2015-09-09 (×2): via INTRAVENOUS
  Administered 2015-09-10: 1000 mL via INTRAVENOUS
  Administered 2015-09-10 – 2015-09-12 (×4): via INTRAVENOUS

## 2015-09-08 MED ORDER — HYDROMORPHONE HCL 1 MG/ML IJ SOLN
0.5000 mg | Freq: Once | INTRAMUSCULAR | Status: AC
  Administered 2015-09-08: 0.5 mg via INTRAVENOUS
  Filled 2015-09-08: qty 1

## 2015-09-08 MED ORDER — LOPERAMIDE HCL 2 MG PO CAPS
2.0000 mg | ORAL_CAPSULE | Freq: Three times a day (TID) | ORAL | Status: DC
  Administered 2015-09-08 – 2015-09-11 (×10): 2 mg via ORAL
  Filled 2015-09-08 (×11): qty 1

## 2015-09-08 MED ORDER — VANCOMYCIN HCL 10 G IV SOLR
1250.0000 mg | Freq: Two times a day (BID) | INTRAVENOUS | Status: DC
  Administered 2015-09-08 – 2015-09-09 (×2): 1250 mg via INTRAVENOUS
  Filled 2015-09-08 (×3): qty 1250

## 2015-09-08 NOTE — Progress Notes (Addendum)
Pharmacy Antibiotic Follow-up Note  Jesse Murray is a 54 y.o. year-old male admitted on 08/29/2015.  The patient is currently on day 3 of vancomycin and Zosyn for HCAP.  He continues to have fevers and elevated WBC.  His renal function has been stable and vancomycin trough is at the high end of therapeutic level.  Given the potential for accumulation, will adjust dose.   Plan: - Change vanc to 1250mg  IV Q12H - Continue Zosyn 3.375gm IV Q8H, 4 hr infusion - Monitor renal fxn, clinical progress, weekly VT - F/U resume Xarelto   Temp (24hrs), Avg:99 F (37.2 C), Min:98.1 F (36.7 C), Max:100.6 F (38.1 C)   Recent Labs Lab 09/05/15 0503 09/06/15 0404 09/06/15 1500 09/07/15 0210 09/08/15 0503  WBC 12.7* 16.8* 16.1* 16.7* 16.2*    Recent Labs Lab 09/02/15 0600 09/04/15 0453 09/06/15 0404 09/07/15 0210 09/08/15 0503  CREATININE 0.56* 0.57* 0.66 0.81 0.76   Estimated Creatinine Clearance: 103.3 mL/min (by C-G formula based on Cr of 0.76).    No Known Allergies   Antimicrobials this admission: Vanc 12/25 >> Zosyn 12/25 >>  Levels/dose changes this admission: 12/27 VT = 20 mcg/mL on 1gm q8 >> 1250mg  q12  Microbiology results: 12/25 MRSR PCR - negative 12/25 UCx - negative 12/25 BCx x2 - pending   Eann Cleland D. Mina Marble, PharmD, BCPS Pager:  [phone] 09/08/2015, 11:15 AM    ==========================  Addendum: - transition abx to Levaquin and Flagyl   Plan: - LVQ 750mg  PO Q24H - Pharmacy will sign off and follow peripherally.  Thank you for the consult! - Consider resuming anticoagulation as elevated INR without Coumadin does not correlate with full anticoagulation    Jerardo Costabile D. Mina Marble, PharmD, BCPS Pager:  [phone] 09/09/2015, 10:32 AM

## 2015-09-08 NOTE — Consult Note (Addendum)
WOC ostomy follow up Stoma appearance has declined since previous assessment.  Current pouch is leaking and was just applied this am, according to the bedside nurse. Stoma type/location: RLQ, ileostomy   Stomal assessment/size: 1 1/2 inches, red and viable, flush with skin level.  Mucocutaneous separation occurring from 11:00 o'clock to 1:00 o'clock; full thickness wound has evolved .5X2X1cm when swab inserted.  Small amt yellow drainage, no odor Generalized erythremia surrounding stoma; red and macerated; appearance consistent with moisture associated skin damage from previous leakage. Output high volume liquid stool; drained 100cc dark green liquid Ostomy pouching: demonstrated pouch change using 2pc. 2 3/4", full thickness peristomal wound packed with Aquacel before wafer was applied and added barrier ring to assist with maintaining seal. Education provided:  Pt watched pouch change demonstration and asked appropriate questions.  He did not want to assist when offered.  He states, "I already know how to do this."  Enrolled patient in [place] Start Discharge program: Yes [facility] team will continue to follow for further teaching sessions. Julien Girt MSN, RN, [place], [place], [place]

## 2015-09-08 NOTE — Progress Notes (Signed)
Patient ID: Jesse Murray, male   DOB: September 28, 1960, 54 y.o.   MRN: [id number]     [facility]      [address] [place], [place] [address]    Phone: [phone] FAX: [phone]     Subjective: Not hungry, didn't eat yesterday. 834m ileostomy output which is increased. Abdominal pain is improved. Hypoxic. Complaining he's not sleeping and asking for medicine to aid in sleep, will leave up to his primary team.   Objective:  Vital signs:  Filed Vitals:   09/08/15 0300 09/08/15 0400 09/08/15 0500 09/08/15 0600  BP: 105/66 116/73 115/70 110/70  Pulse: 83 84 81 85  Temp:  98.4 F (36.9 C)    TempSrc:  Oral    Resp: _0 Height:      Weight:      SpO2: 93% 94% 97% 94%    Last BM Date: 09/07/15  Intake/Output   Yesterday:  12/26 0701 - 12/27 0700 In: 1425 [I.V.:30; Blood:670; IV Piggyback:725] Out: 1800 [Urine:1000; Stool:800] This shift:    I/O last 3 completed shifts: In: 2695 [P.O.:220; I.V.:780; Blood:670; IV Piggyback:1025] Out: 2500 [Urine:1700; Stool:800]     Physical Exam: General: Pt awake/alert/oriented and in no acute distress.  Abdomen: Soft. Nondistended. Mildly tender at incisions only. Midline incision c/d/i. RLQ stoma is pink and viable. No evidence of peritonitis. No incarcerated hernias.   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 Active Problems:   Abdominal pain   Palliative care encounter   DNR (do not resuscitate) discussion   Pain, cancer   Acute deep vein thrombosis (DVT) of proximal vein of lower extremity (HCC)   Absolute anemia   Leg pain, bilateral   [facility] ([facility]   Protein[place]calorie malnutrition, severe ([facility]   [place] [place]omatosis [place]   SBO (small bowel obstruction) from colon cancer s/p colectomy/ileostomy 08/29/2015   Bilateral leg edema   Malignant neoplasm of hepatic flexure ([facility]   Metastatic  cancer ([facility]   Delirium due to general medical condition   [place]   [facility] in place ([facility]   Confusion   Fever   Tachypnea    Results:   Labs: Results for orders placed or performed during the hospital encounter of 08/16/2015 (from the past 48 hour(s))  MRSA PCR Screening     Status: None   Collection Time: 09/06/15  8:49 AM  Result Value Ref Range   MRSA by PCR NEGATIVE NEGATIVE    Comment:        The GeneXpert MRSA Assay (FDA approved for NASAL specimens only), is one component of a comprehensive MRSA colonization surveillance program. It is not intended to diagnose MRSA infection nor to guide or monitor treatment for MRSA infections.   Blood culture (routine x 2)     Status: None (Preliminary result)   Collection Time: 09/06/15  2:50 PM  Result Value Ref Range   Specimen Description BLOOD LEFT ARM    Special Requests BOTTLES DRAWN AEROBIC AND ANAEROBIC 8CC BLUE [place]   Culture NO GROWTH < 24 HOURS    Report Status PENDING   CBC with Differential/Platelet     Status: Abnormal   Collection Time: 09/06/15  3:00 PM  Result Value Ref Range   WBC 16.1 (H) 4.0 - 10.5 K/uL   RBC 2.66 (L) 4.22 - 5.81 MIL/uL   Hemoglobin 7.6 (L) 13.0 - 17.0 g/dL  HCT 23.2 (L) 39.0 - 52.0 %   MCV 87.2 78.0 - 100.0 fL   MCH 28.6 26.0 - 34.0 pg   MCHC 32.8 30.0 - 36.0 g/dL   RDW 17.0 (H) 11.5 - 15.5 %   Platelets 67 (L) 150 - 400 K/uL    Comment: REPEATED TO VERIFY SPECIMEN CHECKED FOR CLOTS CONSISTENT WITH PREVIOUS RESULT    Neutrophils Relative % 82 %   Neutro Abs 13.2 (H) 1.7 - 7.7 K/uL   Lymphocytes Relative 11 %   Lymphs Abs 1.7 0.7 - 4.0 K/uL   Monocytes Relative 6 %   Monocytes Absolute 0.9 0.1 - 1.0 K/uL   Eosinophils Relative 1 %   Eosinophils Absolute 0.2 0.0 - 0.7 K/uL   Basophils Relative 0 %   Basophils Absolute 0.1 0.0 - 0.1 K/uL  Bilirubin, fractionated(tot/dir/indir)     Status: Abnormal   Collection Time: 09/06/15  3:00 PM  Result Value Ref Range    Total Bilirubin 4.1 (H) 0.3 - 1.2 mg/dL   Bilirubin, Direct 2.4 (H) 0.1 - 0.5 mg/dL   Indirect Bilirubin 1.7 (H) 0.3 - 0.9 mg/dL  Blood culture (routine x 2)     Status: None (Preliminary result)   Collection Time: 09/06/15  3:00 PM  Result Value Ref Range   Specimen Description BLOOD LEFT ARM    Special Requests IN PEDIATRIC BOTTLE 2CC    Culture NO GROWTH < 24 HOURS    Report Status PENDING   Urinalysis, Routine w reflex microscopic (not at [facility])     Status: Abnormal   Collection Time: 09/06/15  3:17 PM  Result Value Ref Range   Color, Urine AMBER (A) YELLOW    Comment: BIOCHEMICALS MAY BE AFFECTED BY COLOR   APPearance CLEAR CLEAR   Specific Gravity, Urine 1.014 1.005 - 1.030   pH 6.0 5.0 - 8.0   Glucose, UA NEGATIVE NEGATIVE mg/dL   Hgb urine dipstick NEGATIVE NEGATIVE   Bilirubin Urine MODERATE (A) NEGATIVE   Ketones, ur NEGATIVE NEGATIVE mg/dL   Protein, ur NEGATIVE NEGATIVE mg/dL   Nitrite NEGATIVE NEGATIVE   Leukocytes, UA NEGATIVE NEGATIVE    Comment: MICROSCOPIC NOT DONE ON URINES WITH NEGATIVE PROTEIN, BLOOD, LEUKOCYTES, NITRITE, OR GLUCOSE <1000 mg/dL.  Culture, Urine     Status: None   Collection Time: 09/06/15  3:17 PM  Result Value Ref Range   Specimen Description URINE, CATHETERIZED    Special Requests NONE    Culture 8,000 COLONIES/mL INSIGNIFICANT GROWTH    Report Status 09/07/2015 FINAL   Protime-INR     Status: Abnormal   Collection Time: 09/06/15  4:22 PM  Result Value Ref Range   Prothrombin Time 56.4 (H) 11.6 - 15.2 seconds   INR 6.77 (HH) 0.00 - 1.49    Comment: REPEATED TO VERIFY CRITICAL RESULT CALLED TO, READ BACK BY AND VERIFIED WITH: C WHITE,RN 1727 09/06/2015 WBOND   APTT     Status: Abnormal   Collection Time: 09/06/15  4:22 PM  Result Value Ref Range   aPTT 64 (H) 24 - 37 seconds    Comment:        IF BASELINE aPTT IS ELEVATED, SUGGEST PATIENT RISK ASSESSMENT BE USED TO DETERMINE APPROPRIATE ANTICOAGULANT THERAPY.   Comprehensive  metabolic panel     Status: Abnormal   Collection Time: 09/07/15  2:10 AM  Result Value Ref Range   Sodium 127 (L) 135 - 145 mmol/L   Potassium 3.5 3.5 - 5.1 mmol/L    Comment:  DELTA CHECK NOTED   Chloride 98 (L) 101 - 111 mmol/L   CO2 20 (L) 22 - 32 mmol/L   Glucose, Bld 395 (H) 65 - 99 mg/dL   BUN <5 (L) 6 - 20 mg/dL   Creatinine, Ser 0.81 0.61 - 1.24 mg/dL   Calcium 6.9 (L) 8.9 - 10.3 mg/dL   Total Protein 4.8 (L) 6.5 - 8.1 g/dL   Albumin 1.4 (L) 3.5 - 5.0 g/dL   AST 55 (H) 15 - 41 U/L   ALT 27 17 - 63 U/L   Alkaline Phosphatase 874 (H) 38 - 126 U/L   Total Bilirubin 4.8 (H) 0.3 - 1.2 mg/dL   GFR calc non Af Amer >60 >60 mL/min   GFR calc Af Amer >60 >60 mL/min    Comment: (NOTE) The eGFR has been calculated using the CKD EPI equation. This calculation has not been validated in all clinical situations. eGFR's persistently <60 mL/min signify possible Chronic Kidney Disease.    Anion gap 9 5 - 15  Gamma GT     Status: Abnormal   Collection Time: 09/07/15  2:10 AM  Result Value Ref Range   GGT 427 (H) 7 - 50 U/L  CBC with Differential/Platelet     Status: Abnormal   Collection Time: 09/07/15  2:10 AM  Result Value Ref Range   WBC 16.7 (H) 4.0 - 10.5 K/uL    Comment: WHITE COUNT CONFIRMED ON SMEAR   RBC 2.40 (L) 4.22 - 5.81 MIL/uL   Hemoglobin 6.9 (LL) 13.0 - 17.0 g/dL    Comment: REPEATED TO VERIFY CRITICAL RESULT CALLED TO, READ BACK BY AND VERIFIED WITH: PETTIFORD,A RN 0257 12.26.16 MCADOO,G    HCT 21.1 (L) 39.0 - 52.0 %   MCV 87.9 78.0 - 100.0 fL   MCH 28.8 26.0 - 34.0 pg   MCHC 32.7 30.0 - 36.0 g/dL   RDW 17.3 (H) 11.5 - 15.5 %   Platelets 79 (L) 150 - 400 K/uL    Comment: SPECIMEN CHECKED FOR CLOTS PLATELET COUNT CONFIRMED BY SMEAR    Neutrophils Relative % 81 %   Lymphocytes Relative 12 %   Monocytes Relative 4 %   Eosinophils Relative 3 %   Basophils Relative 0 %   Neutro Abs 13.5 (H) 1.7 - 7.7 K/uL   Lymphs Abs 2.0 0.7 - 4.0 K/uL   Monocytes Absolute  0.7 0.1 - 1.0 K/uL   Eosinophils Absolute 0.5 0.0 - 0.7 K/uL   Basophils Absolute 0.0 0.0 - 0.1 K/uL   RBC Morphology POLYCHROMASIA PRESENT    WBC Morphology MILD LEFT SHIFT (1-5% METAS, OCC MYELO, OCC BANDS)   Protime-INR     Status: Abnormal   Collection Time: 09/07/15  2:15 AM  Result Value Ref Range   Prothrombin Time 37.1 (H) 11.6 - 15.2 seconds   INR 3.88 (H) 0.00 - 1.49  Type and screen [facility]     Status: None (Preliminary result)   Collection Time: 09/07/15 12:15 PM  Result Value Ref Range   ABO/RH(D) O POS    Antibody Screen NEG    Sample Expiration 09/10/2015    Unit Number [id number]    Blood Component Type RED CELLS,LR    Unit division 00    Status of Unit ISSUED    Transfusion Status OK TO TRANSFUSE    Crossmatch Result Compatible    Unit Number [id number]    Blood Component Type RED CELLS,LR    Unit division 00  Status of Unit ISSUED    Transfusion Status OK TO TRANSFUSE    Crossmatch Result Compatible   Prepare RBC     Status: None   Collection Time: 09/07/15 12:15 PM  Result Value Ref Range   Order Confirmation ORDER PROCESSED BY BLOOD BANK   Hemoglobin and hematocrit, blood     Status: Abnormal   Collection Time: 09/07/15 10:00 PM  Result Value Ref Range   Hemoglobin 9.3 (L) 13.0 - 17.0 g/dL    Comment: POST TRANSFUSION SPECIMEN   HCT 28.6 (L) 39.0 - 52.0 %  CBC     Status: Abnormal   Collection Time: 09/08/15  5:03 AM  Result Value Ref Range   WBC 16.2 (H) 4.0 - 10.5 K/uL   RBC 3.41 (L) 4.22 - 5.81 MIL/uL   Hemoglobin 9.5 (L) 13.0 - 17.0 g/dL   HCT 29.5 (L) 39.0 - 52.0 %   MCV 86.5 78.0 - 100.0 fL   MCH 27.9 26.0 - 34.0 pg   MCHC 32.2 30.0 - 36.0 g/dL   RDW 17.1 (H) 11.5 - 15.5 %   Platelets 87 (L) 150 - 400 K/uL    Comment: CONSISTENT WITH PREVIOUS RESULT  Basic metabolic panel     Status: Abnormal   Collection Time: 09/08/15  5:03 AM  Result Value Ref Range   Sodium 136 135 - 145 mmol/L    Comment: DELTA CHECK  NOTED   Potassium 3.5 3.5 - 5.1 mmol/L   Chloride 106 101 - 111 mmol/L   CO2 22 22 - 32 mmol/L   Glucose, Bld 90 65 - 99 mg/dL   BUN <5 (L) 6 - 20 mg/dL   Creatinine, Ser 0.76 0.61 - 1.24 mg/dL   Calcium 7.3 (L) 8.9 - 10.3 mg/dL   GFR calc non Af Amer >60 >60 mL/min   GFR calc Af Amer >60 >60 mL/min    Comment: (NOTE) The eGFR has been calculated using the CKD EPI equation. This calculation has not been validated in all clinical situations. eGFR's persistently <60 mL/min signify possible Chronic Kidney Disease.    Anion gap 8 5 - 15    Imaging / Studies: Ct Angio Chest Pe W/cm &/or Wo Cm  09/06/2015  CLINICAL DATA:  Tachypnea. Bilateral leg DVT. History of colon cancer. Fever. Status post ileostomy. EXAM: CT ANGIOGRAPHY CHEST CT ABDOMEN AND PELVIS WITH CONTRAST TECHNIQUE: Multidetector CT imaging of the chest was performed using the standard protocol during bolus administration of intravenous contrast. Multiplanar CT image reconstructions and MIPs were obtained to evaluate the vascular anatomy. Multidetector CT imaging of the abdomen and pelvis was performed using the standard protocol during bolus administration of intravenous contrast. CONTRAST:  110m OMNIPAQUE IOHEXOL 350 MG/ML SOLN COMPARISON:  09/05/2015 FINDINGS: CTA CHEST FINDINGS There is a small embolus within right lower lobe subsegmental pulmonary artery. No large central pulmonary embolus is seen. There is a small pericardial effusion. The heart is normal in size. Atherosclerotic disease of the coronary arteries is seen. There are numerous mildly enlarged mediastinal lymph nodes the largest of which in right peritracheal location measures 18 mm in long-axis. A 9 mm lymph node is seen in the pericardial fat. There is anasarca. Evaluation of the lung parenchyma demonstrates bilateral pleural effusions and mild pulmonary edema. There is a 6 mm nodule in the periphery of the right upper lobe. Smaller ground-glass nodules are seen in  the right middle lobe. 5 mm soft tissue nodule is seen in the lingula. PICC line is  seen with tip at the cavoatrial junction. CT ABDOMEN and PELVIS FINDINGS Again seen are numerous hepatic metastatic lesions, not significantly changed from the prior exam. Two sub cm hypoechoic lesions are seen in the periphery of the spleen. Again seen is left adrenal mass, measuring 2.1 cm. The pancreas, bilateral kidneys and right adrenal gland are normal. There is diffuse edema of the mesentery and a low-density free intra-abdominal fluid. There is right anterior abdominal wall ileostomy without evidence of upstream obstruction. Patient is status post right colectomy. The transverse colon demonstrates diffuse irregular wall thickening. The residual left colon is decompressed. Diffuse diverticulosis of the sigmoid is noted. There is no evidence of abdominal aortic aneurysm. The celiac, SMA, IMA, portal vein, SMV are opacified. Bilateral femoral veins are poorly opacified and may be thrombosed. The thrombosis extends into the right external iliac vein. There are enlarged retroperitoneal lymph nodes in the upper abdomen. There is no evidence of aggressive appearing osseous lesions. Review of the MIP images confirms the above findings. IMPRESSION: Small embolus within subsegmental right lower lobe pulmonary artery. Small pericardial effusion. Atherosclerotic disease of the coronary arteries. Mediastinal lymphadenopathy. Pulmonary edema with small bilateral pleural effusions. Bilateral sub cm pulmonary soft tissue nodules, concerning for metastatic disease. Stable appearance of heavy burden of hepatic metastatic disease. Two lesions within the spleen which may also be metastatic. Left adrenal mass. Status post right colectomy. Irregular thickening of the residual transverse colon, which may be malignant or infectious/ inflammatory. Ileostomy without evidence of small-bowel obstruction. Bilateral femoral vein thrombosis with extension  of the thrombus into the right external iliac vein. Free fluid within the abdomen. Anasarca. Electronically Signed   By: Fidela Salisbury M.D.   On: 09/06/2015 18:22   Ct Abdomen Pelvis W Contrast  09/06/2015  CLINICAL DATA:  Tachypnea. Bilateral leg DVT. History of colon cancer. Fever. Status post ileostomy. EXAM: CT ANGIOGRAPHY CHEST CT ABDOMEN AND PELVIS WITH CONTRAST TECHNIQUE: Multidetector CT imaging of the chest was performed using the standard protocol during bolus administration of intravenous contrast. Multiplanar CT image reconstructions and MIPs were obtained to evaluate the vascular anatomy. Multidetector CT imaging of the abdomen and pelvis was performed using the standard protocol during bolus administration of intravenous contrast. CONTRAST:  186m OMNIPAQUE IOHEXOL 350 MG/ML SOLN COMPARISON:  09/04/2015 FINDINGS: CTA CHEST FINDINGS There is a small embolus within right lower lobe subsegmental pulmonary artery. No large central pulmonary embolus is seen. There is a small pericardial effusion. The heart is normal in size. Atherosclerotic disease of the coronary arteries is seen. There are numerous mildly enlarged mediastinal lymph nodes the largest of which in right peritracheal location measures 18 mm in long-axis. A 9 mm lymph node is seen in the pericardial fat. There is anasarca. Evaluation of the lung parenchyma demonstrates bilateral pleural effusions and mild pulmonary edema. There is a 6 mm nodule in the periphery of the right upper lobe. Smaller ground-glass nodules are seen in the right middle lobe. 5 mm soft tissue nodule is seen in the lingula. PICC line is seen with tip at the cavoatrial junction. CT ABDOMEN and PELVIS FINDINGS Again seen are numerous hepatic metastatic lesions, not significantly changed from the prior exam. Two sub cm hypoechoic lesions are seen in the periphery of the spleen. Again seen is left adrenal mass, measuring 2.1 cm. The pancreas, bilateral kidneys and  right adrenal gland are normal. There is diffuse edema of the mesentery and a low-density free intra-abdominal fluid. There is right anterior abdominal wall ileostomy  without evidence of upstream obstruction. Patient is status post right colectomy. The transverse colon demonstrates diffuse irregular wall thickening. The residual left colon is decompressed. Diffuse diverticulosis of the sigmoid is noted. There is no evidence of abdominal aortic aneurysm. The celiac, SMA, IMA, portal vein, SMV are opacified. Bilateral femoral veins are poorly opacified and may be thrombosed. The thrombosis extends into the right external iliac vein. There are enlarged retroperitoneal lymph nodes in the upper abdomen. There is no evidence of aggressive appearing osseous lesions. Review of the MIP images confirms the above findings. IMPRESSION: Small embolus within subsegmental right lower lobe pulmonary artery. Small pericardial effusion. Atherosclerotic disease of the coronary arteries. Mediastinal lymphadenopathy. Pulmonary edema with small bilateral pleural effusions. Bilateral sub cm pulmonary soft tissue nodules, concerning for metastatic disease. Stable appearance of heavy burden of hepatic metastatic disease. Two lesions within the spleen which may also be metastatic. Left adrenal mass. Status post right colectomy. Irregular thickening of the residual transverse colon, which may be malignant or infectious/ inflammatory. Ileostomy without evidence of small-bowel obstruction. Bilateral femoral vein thrombosis with extension of the thrombus into the right external iliac vein. Free fluid within the abdomen. Anasarca. Electronically Signed   By: Fidela Salisbury M.D.   On: 09/06/2015 18:22    Medications / Allergies:  Scheduled Meds: . sodium chloride   Intravenous Once  . antiseptic oral rinse  7 mL Mouth Rinse BID  . feeding supplement (ENSURE ENLIVE)  237 mL Oral BID BM  .  HYDROmorphone (DILAUDID) injection  2 mg  Intravenous Q6H  . lip balm   Topical BID  . LORazepam  0.5 mg Intravenous Daily  . piperacillin-tazobactam (ZOSYN)  IV  3.375 g Intravenous Q8H  . sodium chloride  10-40 mL Intracatheter Q12H  . vancomycin  1,000 mg Intravenous Q8H   Continuous Infusions:  PRN Meds:.acetaminophen, acetaminophen, alum & mag hydroxide-simeth, bismuth subsalicylate, haloperidol lactate, loperamide, ondansetron (ZOFRAN) IV, sodium chloride  Antibiotics: Anti-infectives    Start     Dose/Rate Route Frequency Ordered Stop   09/06/15 1400  piperacillin-tazobactam (ZOSYN) IVPB 3.375 g     3.375 g 12.5 mL/hr over 240 Minutes Intravenous Every 8 hours 09/06/15 0841     09/06/15 1000  vancomycin (VANCOCIN) IVPB 1000 mg/200 mL premix     1,000 mg 200 mL/hr over 60 Minutes Intravenous Every 8 hours 09/06/15 0843     09/06/15 0900  piperacillin-tazobactam (ZOSYN) IVPB 3.375 g     3.375 g 100 mL/hr over 30 Minutes Intravenous  Once 09/06/15 0841 09/06/15 1000   08/18/2015 0815  ceFAZolin (ANCEF) IVPB 2 g/50 mL premix     2 g 100 mL/hr over 30 Minutes Intravenous  Once 08/27/2015 0804 09/08/2015 1145   08/23/2015 0815  metroNIDAZOLE (FLAGYL) IVPB 500 mg    Comments:  OR holding area   500 mg 100 mL/hr over 60 Minutes Intravenous  Once 09/12/2015 0804 09/09/2015 0919       Assessment/Plan Stage IV adenocarcinoma of colon POD#11 exploratory laparotomy, partial colectomy, ileostomy---Dr. Ninfa Linden  -continue to encourage POs, supplement shakes -ileostomy, output increased, change Imodium to scheduled -DC staples on POD#14 ID-Vanc/zosyn for PNA. CT a/p without post op complications. DVT/PE-Xarelto on hold d/t supratherapeutic INR and thrombocytopenia.  PCM-Poor PO intake, albumin 1.4. Calorie count start.  Discussed alternative feeding options, pt refuses.  Has palliative care meeting today Dispo-SDU    Erby Pian, [facility] Pager [phone]) For consults and floor pages call  [phone])  09/08/2015 7:36  AM    

## 2015-09-08 NOTE — Progress Notes (Signed)
Daily Progress Note   Patient Name: Jesse Murray       Date: 09/08/2015 DOB: 1961/01/06  Age: 54 y.o. MRN#: [id number] Attending Physician: Kinnie Feil, MD Primary Care Physician: No PCP Per Patient Admit Date: 08/25/2015  Reason for Consultation/Follow-up: Establishing goals of care, Non pain symptom management, Pain control and Psychosocial/spiritual support  Subjective:  -I meet at bedside with sister Jesse Murray and had continued conversation regarding diagnosis, prognosis, GOC, EOL wishes disposition and options.  A detailed discussion was had today regarding advanced directives.  Concepts specific to code status, artifical feeding and hydration, continued IV antibiotics and rehospitalization was had.  The difference between a aggressive medical intervention path  and a palliative comfort care path for this patient at this time was had.  Values and goals of care important to patient and family were attempted to be elicited.  Concept of Hospice and Palliative Care were discussed.  Natural trajectory and expectations at EOL were discussed.  Questions and concerns addressed.   Family encouraged to call with questions or concerns.  PMT will continue to support holistically.  [name] verbalizes desire to "keep fighting" and hopes for possibility of improvement and continued quality of life.  His sister verbalizes her worry that this is not going to happen  Length of Stay: 13 days  Current Medications: Scheduled Meds:  . sodium chloride   Intravenous Once  . antiseptic oral rinse  7 mL Mouth Rinse BID  . feeding supplement (ENSURE ENLIVE)  237 mL Oral BID BM  .  HYDROmorphone (DILAUDID) injection  2 mg Intravenous Q4H  . lip balm   Topical BID  . loperamide  2 mg Oral 3 times per day  .  LORazepam  0.5 mg Intravenous Daily  . piperacillin-tazobactam (ZOSYN)  IV  3.375 g Intravenous Q8H  . sodium chloride  10-40 mL Intracatheter Q12H  . vancomycin  1,250 mg Intravenous Q12H    Continuous Infusions: . dextrose 5 % and 0.45% NaCl 100 mL/hr at 09/08/15 1136    PRN Meds: acetaminophen, acetaminophen, alum & mag hydroxide-simeth, bismuth subsalicylate, ondansetron (ZOFRAN) IV, sodium chloride  Physical Exam: Physical Exam  Constitutional: He appears lethargic. He appears cachectic. He appears ill.  HENT:  Mouth/Throat: Mucous membranes are normal. No oropharyngeal exudate.  Cardiovascular: Normal rate, regular rhythm and  normal heart sounds.   Pulmonary/Chest: He has decreased breath sounds in the right lower field and the left lower field.  Abdominal: There is generalized tenderness.  - noted iliostomy  Neurological: He appears lethargic. He displays atrophy.  Skin: Skin is warm and dry. There is pallor.  Warm and dry                Vital Signs: BP 109/68 mmHg  Pulse 84  Temp(Src) 98.7 F (37.1 C) (Axillary)  Resp 25  Ht 5\' 8"  (1.727 m)  Wt 79.742 kg (175 lb 12.8 oz)  BMI 26.74 kg/m2  SpO2 92% SpO2: SpO2: 92 % O2 Device: O2 Device: Nasal Cannula O2 Flow Rate: O2 Flow Rate (L/min): 2.5 L/min  Intake/output summary:   Intake/Output Summary (Last 24 hours) at 09/08/15 1203 Last data filed at 09/08/15 0700  Gross per 24 hour  Intake   1225 ml  Output   1800 ml  Net   -575 ml   LBM: Last BM Date: 09/07/15 Baseline Weight: Weight: 68.04 kg (150 lb) Most recent weight: Weight: 79.742 kg (175 lb 12.8 oz)       Palliative Assessment/Data: Flowsheet Rows        Most Recent Value   Intake Tab    Referral Department  -- [Family Medicine]   Unit at Time of Referral  Med/Surg Unit   Palliative Care Primary Diagnosis  Cancer   Date Notified  09/02/2015   Palliative Care Type  New Palliative care   Reason for referral  Clarify Goals of Care   Date of  Admission  08/23/2015   Date first seen by Palliative Care  08/18/2015   # of days Palliative referral response time  0 Day(s)   # of days IP prior to Palliative referral  0   Clinical Assessment    Psychosocial & Spiritual Assessment    Palliative Care Outcomes       Additional Data Reviewed: CBC    Component Value Date/Time   WBC 16.2* 09/08/2015 0503   WBC 11.1* 07/14/2015 1745   RBC 3.41* 09/08/2015 0503   RBC 4.22* 07/14/2015 1745   HGB 9.5* 09/08/2015 0503   HGB 12.3* 07/14/2015 1745   HCT 29.5* 09/08/2015 0503   HCT 36.5* 07/14/2015 1745   PLT 87* 09/08/2015 0503   MCV 86.5 09/08/2015 0503   MCV 86.3 07/14/2015 1745   MCH 27.9 09/08/2015 0503   MCH 29.2 07/14/2015 1745   MCHC 32.2 09/08/2015 0503   MCHC 33.8 07/14/2015 1745   RDW 17.1* 09/08/2015 0503   LYMPHSABS 2.0 09/07/2015 0210   MONOABS 0.7 09/07/2015 0210   EOSABS 0.5 09/07/2015 0210   BASOSABS 0.0 09/07/2015 0210    CMP     Component Value Date/Time   NA 136 09/08/2015 0503   K 3.5 09/08/2015 0503   CL 106 09/08/2015 0503   CO2 22 09/08/2015 0503   GLUCOSE 90 09/08/2015 0503   BUN <5* 09/08/2015 0503   CREATININE 0.76 09/08/2015 0503   CREATININE 0.69* 07/14/2015 1732   CALCIUM 7.3* 09/08/2015 0503   PROT 4.8* 09/07/2015 0210   ALBUMIN 1.4* 09/07/2015 0210   AST 55* 09/07/2015 0210   ALT 27 09/07/2015 0210   ALKPHOS 874* 09/07/2015 0210   BILITOT 4.8* 09/07/2015 0210   GFRNONAA >60 09/08/2015 0503   GFRAA >60 09/08/2015 0503       Problem List:  Patient Active Problem List   Diagnosis Date Noted  . Delirium due to general  medical condition   . Colon cancer ([place])   . Colostomy in place [facility])   . Confusion   . Fever   . Tachypnea   . 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 Protein-calorie malnutrition, severe ([place]) 09/02/2015  . 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 SBO (small bowel obstruction) from colon cancer s/p colectomy/ileostomy 08/29/2015 09/02/2015  . Bilateral  leg edema   . Malignant neoplasm of hepatic flexure ([place])   . Metastatic cancer ([place])   . Metastatic colon cancer to liver ([place]) 09/01/2015  . Leg pain, bilateral   . Absolute anemia   . Acute deep vein thrombosis (DVT) of proximal vein of lower extremity ([facility])   . Palliative care encounter 08/27/2015  . DNR (do not resuscitate) discussion 08/27/2015  . Pain, cancer 08/27/2015  . 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 Abdominal pain 08/30/2015     Palliative Care Assessment & Plan    1.Code Status:  Partial code documented today; no CPR, compression, intubation .    Code Status Orders        Start     Ordered   08/23/2015 1500  Full code   Continuous     08/27/2015 1459       2. Goals of Care/Additional Recommendations:  Continue current treatment plan, patient is hopeful for improvement and the possibility of treatment for his cancer  Desire for further Chaplaincy support:no-strong community church support  Psycho-social Needs: Education on Hospice and Medicaid/Financial Assistance  3. Symptom Management:       1.  Pain:  Patient is now taking Dilaudid 2 mg every 4 hrs IV prescribed by attending    Discussed with Family Medicine need for pain management medications be converted to oral agents prior  disposition     4. Palliative Prophylaxis:    Aspiration, Bowel Regimen, Delirium Protocol, Frequent Pain Assessment, Oral Care and Turn Reposition  5. Prognosis:  Weeks to months.    I worry that Jesse Murray will be unable to ralley after this surgery and get strong enough to tolerate palliative chemotherapy.  Nutrition will be crititcal and his current albumin is 1.5.  His sister Jesse Murray is now here in [place] and plans to stay  6. Discharge Planning:  When patient is medically stable discharge to SNF for rehabilitation   Care plan was discussed with Dr Andria Frames  Thank you for allowing the Palliative Medicine Team to assist in the care of this  patient.   Time In: 1200 Time Out: 1300 Total Time 60  min Prolonged Time Billed  no         Knox Royalty, NP  09/08/2015, 12:03 PM  Please contact Palliative Medicine Team phone at [phone] for questions and concerns.

## 2015-09-09 LAB — CBC
HCT: 29.3 % — ABNORMAL LOW (ref 39.0–52.0)
Hemoglobin: 9.4 g/dL — ABNORMAL LOW (ref 13.0–17.0)
MCH: 27.9 pg (ref 26.0–34.0)
MCHC: 32.1 g/dL (ref 30.0–36.0)
MCV: 86.9 fL (ref 78.0–100.0)
PLATELETS: 88 10*3/uL — AB (ref 150–400)
RBC: 3.37 MIL/uL — ABNORMAL LOW (ref 4.22–5.81)
RDW: 17.5 % — AB (ref 11.5–15.5)
WBC: 14.8 10*3/uL — ABNORMAL HIGH (ref 4.0–10.5)

## 2015-09-09 LAB — BASIC METABOLIC PANEL WITH GFR
Anion gap: 8 (ref 5–15)
BUN: 6 mg/dL (ref 6–20)
CO2: 22 mmol/L (ref 22–32)
Calcium: 7.2 mg/dL — ABNORMAL LOW (ref 8.9–10.3)
Chloride: 105 mmol/L (ref 101–111)
Creatinine, Ser: 0.73 mg/dL (ref 0.61–1.24)
GFR calc Af Amer: >60 mL/min
GFR calc non Af Amer: >60 mL/min
Glucose, Bld: 106 mg/dL — ABNORMAL HIGH (ref 65–99)
Potassium: 3.2 mmol/L — ABNORMAL LOW (ref 3.5–5.1)
Sodium: 135 mmol/L (ref 135–145)

## 2015-09-09 LAB — PROTIME-INR
INR: 2.13 — AB (ref 0.00–1.49)
Prothrombin Time: 23.7 seconds — ABNORMAL HIGH (ref 11.6–15.2)

## 2015-09-09 MED ORDER — METRONIDAZOLE 500 MG PO TABS
500.0000 mg | ORAL_TABLET | Freq: Three times a day (TID) | ORAL | Status: DC
  Administered 2015-09-09 – 2015-09-11 (×7): 500 mg via ORAL
  Filled 2015-09-09 (×8): qty 1

## 2015-09-09 MED ORDER — ENSURE ENLIVE PO LIQD: 237.0000 mL | Freq: Two times a day (BID) | ORAL | Status: AC

## 2015-09-09 MED ORDER — ALUM & MAG HYDROXIDE-SIMETH 200-200-20 MG/5ML PO SUSP: 30.0000 mL | Freq: Four times a day (QID) | ORAL | Status: AC | PRN

## 2015-09-09 MED ORDER — LEVOFLOXACIN 750 MG PO TABS
750.0000 mg | ORAL_TABLET | Freq: Every day | ORAL | Status: DC
Start: 2015-09-09 — End: 2015-09-12
  Administered 2015-09-09 – 2015-09-12 (×4): 750 mg via ORAL
  Filled 2015-09-09 (×4): qty 1

## 2015-09-09 NOTE — Progress Notes (Signed)
RN was notified by central tele that pts heart rate was in the 170s. RN went into the pts room and the monitor was reading 174, with a good wave form. Pt was asymptomatic. Asked nurse tech to obtain a stat EKG while RN contacted the doctor. Pts heart rate decreased to the 90s and the Doc from [facility]MT[facility] advised to hold off on the EKG while the heart rate is WNL. Central tele saved the monitor strip. Will continue to monitor.

## 2015-09-09 NOTE — Progress Notes (Signed)
Family Medicine Teaching Service Daily Progress Note Intern Pager: [phone]  Patient name: Jesse Murray Medical record number: [id number] Date of birth: June 01, 1961 Age: 54 y.o. 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 male presenting with abdominal pain, as well as bilateral leg swelling and pain. PMH is significant for EtOH abuse.   SVT- Had 8 beats of ventricular tachycardia with SVT overnight. Pt asymptomatic from this - Will continue to monitor closely on telemetry and treat if he has longer episodes of tachycardia  AMS, resolved: Etiology unknown. Possible due to HAP (see below) vs delirium 2/2 pain vs PE vs mets to brain. Robaxin discontinued and pain meds changed to scheduled Dilaudid to prevent withdrawal (patient not asking for PRN pain meds once altered). CTA showed small PEs which may be contributing. UA not suggestive of UTI. Continues to be oriented to person, place, time, and situation. Has not required Haldol or PRN Ativan for agitation in >24 hours. - Discontinue Haldol and PRN Ativan - Continue Ativan 0.5 mg qd scheduled  ID: Likely HAP Developed fevers (102.8F) and AMS on 12/24 w/ CXR = ? PNA. Possible GI source given recent surgery. Lactic acid WNL (12/24). WBC improved to 16.2 (12/27). Afebrile overnight and this AM. Bcx with insignificant growth (but collected after beginning abx).   - Oxygen  Demand: [place]@2 .5L - ABx: Vanc/Zosyn - Day 5 (12/24 >>12/28), Levo/Flagyl 12/28>> to continue for 10 days to cover for potential GI and Respiratory source of infection  Elevated ALP and Bili: Question if related to liver/gallbladder as AST mildly elevated and ALT WNL. PT, PTT, INR, gamma GT elevated. CT abd showed already known stable appearance of heavy burden of hepatic metastatic disease but no new findings. T.bili 4.8 (12/26).  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 Ex-lap 12/16 found primary to be most likely pancreatic that is invading hepatic flexure; unresectable but ileostomy was done to help alleviate symptoms. Reported abdominal pain between doses of dilaudid last night and required extra dose. Endorses continued pain this AM.  - Pain control: Dilaudid 2 mg q4 hr scheduled  - Palliative care continuing to follow pt - appreciate recs  - PT/OT consulted - Wound care following  - Now has scheduled Imodium per surgery due to increased ileostomy output  Leg pain/swelling likely 2/2 bilateral DVTs: Most likely DVT due to hypercoagulable state from malignancy and/or recent inactivity after lying in bed for three weeks. Well's criteria for DVT 7 (high risk for DVT). Well's criteria for PE 5.5 (moderate risk). Physical exam also consistent with DVT. Patient transitioned from heparin to Xarelto for long-term anticoagulation on 12/22, however Xarelto held beginning 12/25 given INR 6.77. CTA chest showed small pulmonary embolus as well as pulmonary edema with small bilateral pleural effusions. Tachycardia improved  But continues  to be tachypneic in the 20s, on 3L [place] O2. - Consider resuming Xarelto as INR further improves,   Pressure ulcer: Stage II in gluteal cleft with serosanguinous  drainage. Patient at increased risk due to malnutrition, relative immobility, and increased moisture. Foam island added 12/23.  - Wound care continuing to follow - will change foam island q3 days - Prescribed pressure-distributing chair cushion  Anemia: Hgb 9.7 on admission (12.3 one month ago). No signs of bleeding. Also potentially related to malignancy. FOBT negative. Vitals are stable. PT increased further to 37, APTT elevated at 64. INR improved but still elevated at 3.9. Pt received 2U PRBC yesterday. Hgb increased to 9.5 this AM (12/27).  - Daily H&H - Transfusion threshold <7  Thrombocytopenia: Plts improved at 87 (12/27) from 79 (12/26); 185 on admit (12/14). 4T score = 1; unlikely HIT. Transitioned from heparin to Xarelto 12/22. Xarelto held beginning 12/25 given INR 6.77.  - Continue to monitor  - Consider resuming Xarelto 12/29 as INR is improving  AKI: Resolved. Unsure of baseline Cr but one month ago was 0.69. On admission 1.31. Patient did receive IV contrast in ED for CT scan. Also with decreased PO intake likely a prerenal etiology as well. BUN/Cr ratio >20. Cr improved to 0.76 (12/27).  - Avoid nephrotoxic agents including contrast - I/Os  Hypotension: Resolved. Most likely due to volume losses from high ileostomy output and inadequate PO intake (patient off TPN now but not eating and refusing to resume TPN). BP 100s/low 70s overnight.  - Encourage increased PO intake  Social: Patient has agreed to SNF placement with goal of regaining strength to became candidate for palliative chemo. Patient's sister scheduled to meet with palliative care today to discuss goals of care.  - SW Consulted: Working on SNF placement  FEN/GI: reg diet Prophylaxis: Xarelto  Dispo: SNF pending resolution of anemia and fever  Subjective:  Denies chest pain, SOB. Reports improved abd pain and leg pain  Objective: Temp:  [97.5 F (36.4 C)-98.7 F (37.1 C)] 97.7 F (36.5 C) (12/28  0400) Pulse Rate:  [80-105] 94 (12/28 0400) Resp:  [16-29] 29 (12/28 0400) BP: (97-119)/(66-76) 108/74 mmHg (12/28 0400) SpO2:  [90 %-98 %] 91 % (12/28 0400) Physical Exam: General: ill-appearing, cachectic, lying in bed in NAD Neuro: A&Ox4, no focal deficits  Cardiovascular: RRR, not tachycardic, no murmurs appreciated Respiratory: CTAB, no wheezes, became SOB after adjusting his position in bed Abdomen: +BS, soft, non-distended, non-tender, ostomy bag in place with stool and gas in it Extremities: 1+ pitting edema to knee on RLE with 2+ pitting edema of foot to ankle; 2+ edema of L foot to ankle, 1+ pitting edema to knee of LLE  Laboratory:  Recent Labs Lab 09/06/15 1500 09/07/15 0210 09/07/15 2200 09/08/15 0503  WBC 16.1* 16.7*  --  16.2*  HGB 7.6* 6.9* 9.3* 9.5*  HCT 23.2* 21.1* 28.6* 29.5*  PLT 67* 79*  --  87*    Recent Labs Lab 09/06/15 0404 09/06/15 1500 09/07/15 0210 09/08/15 0503  NA 135  --  127* 136  K 4.1  --  3.5 3.5  CL 104  --  98* 106  CO2 21*  --  20* 22  BUN <5*  --  <5* <5*  CREATININE 0.66  --  0.81 0.76  CALCIUM 7.8*  --  6.9* 7.3*  PROT 4.9*  --  4.8*  --   BILITOT 3.9* 4.1* 4.8*  --   ALKPHOS 1099*  --  874*  --  ALT 24  --  27  --   AST 48*  --  55*  --   GLUCOSE 112*  --  395* 90    Imaging/Diagnostic Tests: No results found.  Veatrice Bourbon, MD 09/09/2015, 4:26 AM PGY-2, [place] Intern pager: [phone], text pages welcome

## 2015-09-09 NOTE — Progress Notes (Signed)
PT Cancellation Note  Patient Details Name: Jesse Murray MRN: [id number] DOB: [birth year]   Cancelled Treatment:    Pt declined therapy this AM due to abdominal pain. Pt had been premedicated but stated that it was still intolerable at this time. Will check back this afternoon as time allows.   [place], [place] 09/09/2015, 11:30 AM

## 2015-09-09 NOTE — Progress Notes (Addendum)
Nutrition Follow-up/Consult  DOCUMENTATION CODES:   Severe malnutrition in context of chronic illness  INTERVENTION:    Continue Ensure Enlive po BID, each supplement provides 350 kcal and 20 grams of protein   Re-start nutrition support (TF vs TPN) based on goals of care  NUTRITION DIAGNOSIS:   Inadequate oral intake related to altered GI function as evidenced by meal completion < 50%, ongoing  GOAL:   Patient will meet greater than or equal to 90% of their needs, unmet  MONITOR:   PO intake, Supplement acceptance, Weight trends, I & O's  ASSESSMENT:   Jesse Murray is a 54 y.o. male presenting with abdominal pain, as well as bilateral leg swelling and pain. PMH is significant for EtOH abuse.    S/p Procedure(s) 08/31/2015: EXPLORATORY LAPAROTOMY PARTIAL COLECTOMY ILEOSTOMY MUCUS FISTULA  Initial assessment completed 12/22.  Malnutrition identified and ongoing.  Pt currently on a Regular diet.  PO intake poor at 0-25% per flowsheet records.  Ensure Enlive orders in place.  Pt drinking.  CWOCN following for ileostomy care.  Palliative Care Team following.  Diet Order:  Diet regular Room service appropriate?: Yes; Fluid consistency:: Thin  Skin:  Reviewed, no issues  Last BM:  12/27  Height:   Ht Readings from Last 1 Encounters:  09/01/2015 5\' 8"  (1.727 m)    Weight:   Wt Readings from Last 1 Encounters:  09/08/15 175 lb 12.8 oz (79.742 kg)    Ideal Body Weight:  70 kg  BMI:  Body mass index is 26.74 kg/(m^2).  Estimated Nutritional Needs:   Kcal:  2000-2200  Protein:  95-110 grams  Fluid:  2.0-2.2 L  EDUCATION NEEDS:   No education needs identified at this time  Arthur Holms, RD, LDN Pager #: [phone] After-Hours Pager #: [phone]

## 2015-09-09 NOTE — Progress Notes (Signed)
PT Cancellation Note  Patient Details Name: Jesse Murray MRN: [id number] DOB: 1960-11-26   Cancelled Treatment:    Reason Eval/Treat Not Completed: Pain limiting ability to participate, checked back on pt and he is still experiencing pain limiting ability to participate despite pain meds.    [place], [place] 09/09/2015, 4:21 PM

## 2015-09-10 ENCOUNTER — Encounter (HOSPITAL_COMMUNITY): Payer: Self-pay

## 2015-09-10 LAB — BASIC METABOLIC PANEL
ANION GAP: 5 (ref 5–15)
BUN: 6 mg/dL (ref 6–20)
CHLORIDE: 105 mmol/L (ref 101–111)
CO2: 22 mmol/L (ref 22–32)
Calcium: 7.3 mg/dL — ABNORMAL LOW (ref 8.9–10.3)
Creatinine, Ser: 0.7 mg/dL (ref 0.61–1.24)
GFR calc Af Amer: >60 mL/min (ref >60)
GLUCOSE: 108 mg/dL — AB (ref 65–99)
POTASSIUM: 3.5 mmol/L (ref 3.5–5.1)
SODIUM: 132 mmol/L — AB (ref 135–145)

## 2015-09-10 LAB — PROTIME-INR
INR: 2.15 — AB (ref 0.00–1.49)
Prothrombin Time: 23.8 seconds — ABNORMAL HIGH (ref 11.6–15.2)

## 2015-09-10 MED ORDER — RIVAROXABAN 15 MG PO TABS
15.0000 mg | ORAL_TABLET | Freq: Two times a day (BID) | ORAL | Status: DC
  Administered 2015-09-10 – 2015-09-11 (×3): 15 mg via ORAL
  Filled 2015-09-10 (×3): qty 1

## 2015-09-10 MED ORDER — HYDROMORPHONE HCL 1 MG/ML IJ SOLN
1.0000 mg | Freq: Once | INTRAMUSCULAR | Status: AC
  Administered 2015-09-10: 1 mg via INTRAVENOUS
  Filled 2015-09-10: qty 1

## 2015-09-10 MED ORDER — OXYCODONE HCL ER 15 MG PO T12A
30.0000 mg | EXTENDED_RELEASE_TABLET | Freq: Two times a day (BID) | ORAL | Status: DC
  Administered 2015-09-10 – 2015-09-11 (×3): 30 mg via ORAL
  Filled 2015-09-10 (×3): qty 2

## 2015-09-10 MED ORDER — RIVAROXABAN 20 MG PO TABS
20.0000 mg | ORAL_TABLET | Freq: Every day | ORAL | Status: DC
Start: 2015-10-01 — End: 2015-09-11

## 2015-09-10 MED ORDER — HYDROMORPHONE HCL 1 MG/ML IJ SOLN
1.0000 mg | INTRAMUSCULAR | Status: DC | PRN
  Administered 2015-09-10 – 2015-09-12 (×7): 1 mg via INTRAVENOUS
  Filled 2015-09-10 (×7): qty 1

## 2015-09-10 NOTE — Plan of Care (Signed)
Problem: Pain Managment: Goal: General experience of comfort will improve Outcome: Progressing Patient's pain is adequately controlled with prescribed pain medication. Rest and relaxation encouraged. Reassurance given. Visitation by family and friends encouraged.

## 2015-09-10 NOTE — Progress Notes (Signed)
Report called to Taylor, RN.

## 2015-09-10 NOTE — Progress Notes (Signed)
Physical Therapy Treatment Patient Details Name: Jesse Murray MRN: [id number] DOB: December 20, 1960 Today's Date: 09/10/2015    History of Present Illness 54 y.o. male admitted to [facility] on 08/25/2015 with abdominal pain found to have pancreatic mets and SBO s/p exp lap, colectomy and ilieostomy, mucus fistula on 08/30/2015.  Pt with significant PMHx of DVT R leg dx ~08/22/15, and L knee arthroscopy.      PT Comments    Pt admitted with above diagnosis. Pt currently with functional limitations due to balance and endurance deficits. Pt was able to get up to chair with +2 assist for safety.  Pt also performed some exercise.  Progressing slowly.   Pt will benefit from skilled PT to increase their independence and safety with mobility to allow discharge to the venue listed below.    Follow Up Recommendations  SNF     Equipment Recommendations  Rolling walker with 5" wheels    Recommendations for Other Services       Precautions / Restrictions Precautions Precautions: Fall Precaution Comments: abdominal wound and ostomy Restrictions Weight Bearing Restrictions: No    Mobility  Bed Mobility Overal bed mobility: Needs Assistance Bed Mobility: Supine to Sit     Supine to sit: Min assist;HOB elevated     General bed mobility comments: Pt needed assist for LEs and reached for hand to get up to EOB.  Pulled up on PT.    Transfers Overall transfer level: Needs assistance Equipment used: 2 person hand held assist Transfers: Sit to/from [facility] Sit to Stand: Min assist;+2 safety/equipment Stand pivot transfers: Min assist;+2 safety/equipment       General transfer comment: Min assist for safety and balance during transitions. Pt was soaked with urine on arrival with bed, gown and linens wet.  Changed pt and washed him thoroughly.  Pt able to stand for PT to clean his skin with pt able to wash his penis and anterior groin area and PT had to clean his buttocks and backs of  legs.  Pt was steady overall but was resting his LEs against the bed.  Pt needed verbal cues for safe hand placement with sit to stand and cues to reach for the recliner during the transition to sitting.  Needed assist to control  descent to sit.   Ambulation/Gait             General Gait Details: Pt stood so long to be cleaned, he was too fatigued to ambulate.  He stood about 4 minutes to be cleaned and for NT to replace condom cath.     Stairs            Wheelchair Mobility    Modified Rankin (Stroke Patients Only)       Balance Overall balance assessment: Needs assistance Sitting-balance support: No upper extremity supported;Feet supported Sitting balance-Leahy Scale: Good     Standing balance support: Bilateral upper extremity supported;During functional activity Standing balance-Leahy Scale: Poor Standing balance comment: requires UE support for balance at least intermittently.  Unsteady unless LEs against bed or holding onto rail or therapist.                     Cognition Arousal/Alertness: Awake/alert Behavior During Therapy: Flat affect Overall Cognitive Status: Within Functional Limits for tasks assessed                      Exercises General Exercises - Lower Extremity Ankle Circles/Pumps: AROM;Both;10 reps;Seated Long Arc  Quad: AROM;Both;10 reps Hip Flexion/Marching: AROM;Both;10 reps    General Comments        Pertinent Vitals/Pain Pain Assessment: Faces Faces Pain Scale: Hurts even more Pain Location: bil LEs and abdomen Pain Descriptors / Indicators: Aching;Grimacing;Guarding;Sore Pain Intervention(s): Limited activity within patient's tolerance;Monitored during session;Repositioned  VSS    Home Living                      Prior Function            PT Goals (current goals can now be found in the care plan section) Progress towards PT goals: Progressing toward goals    Frequency  Min 2X/week (per department  protocol for SNF placement)    PT Plan Frequency needs to be updated    Co-evaluation             End of Session Equipment Utilized During Treatment: Gait belt Activity Tolerance: Patient limited by pain;Patient limited by fatigue Patient left: in chair;with call bell/phone within reach;with chair alarm set;with nursing/sitter in room     Time: 0912-0946 PT Time Calculation (min) (ACUTE ONLY): 34 min  Charges:  $Therapeutic Activity: 8-22 mins $Self Care/Home Management: 8-22                    G CodesDenice Paradise 09-20-2015, 11:52 AM  [facility] [phone] (pager)

## 2015-09-10 NOTE — Progress Notes (Signed)
Family Medicine Teaching Service Daily Progress Note Intern Pager: [phone]  Patient name: Jesse Murray Medical record number: [id number] Date of birth: [birth year] Age: 54 y.o. 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 male presenting with abdominal pain, as well as bilateral leg swelling and pain. PMH is significant for EtOH abuse.   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 Ex-lap 12/16 found primary to be most likely pancreatic that is invading hepatic flexure; unresectable but ileostomy was done to help alleviate symptoms.  - Pain control: Oxycontin 30mg  bid, dilaudid 1mg  prn - Palliative care continuing to follow pt - appreciate recs  - PT/OT consulted - Wound care following  - Now has scheduled Imodium per surgery due to increased ileostomy output  Fever: Likely HAP (CXR with opacities consistent with PNA). Possible GI source given recent surgery. Lactic acid WNL (12/24). WBC improved to 16.2 (12/27).  Bcx with insignificant growth (but collected after beginning abx).  Last febrile 12/26. - Wean supplementary oxygen as needed - ABx: Vanc/Zosyn - Day 5 (12/24 >>12/28), Levo/Flagyl 12/28>> to continue for 10 days to  cover for potential GI and Respiratory source of infection  Elevated ALP and Bili: Question if related to liver/gallbladder as AST mildly elevated and ALT WNL. PT, PTT, INR, gamma GT elevated. CT abd showed already known stable appearance of heavy burden of hepatic metastatic disease but no new findings. T.bili 4.8 (12/26).  - Continue to monitor  Bilateral DVTs: Femoral thrombosis noted on CTA abdomen and pelvis. CTA chest showed small pulmonary embolus as well as pulmonary edema with small bilateral pleural effusions. Patient transitioned from heparin to Xarelto for long-term anticoagulation on 12/22, however Xarelto held beginning 12/25 given INR 6.77.  -Trend INR -Restart xarelto per pharmacy  Pressure ulcer: Stage II in gluteal cleft with serosanguinous drainage. Patient at increased risk due to malnutrition, relative immobility, and increased moisture. Foam island added 12/23.  - Wound care continuing to follow - will change foam island q3 days - Prescribed pressure-distributing chair cushion  Anemia: Hgb 9.7 on admission (12.3 one month ago). No signs of bleeding. Potentially related to malignancy. FOBT negative. Vitals are stable.  - Daily H&H - Transfusion threshold <7  Thrombocytopenia: 4T score = 1; unlikely HIT. Possibly related to malignancy. No signs of active bleeding.  - Continue to monitor   AMS, resolved: Etiology unknown. Possible due to HAP vs delirium 2/2 pain vs PE vs mets to brain. CTA showed small PEs which may be contributing. UA not suggestive of UTI. Continues to be oriented to person, place, time, and situation. Has not required Haldol or PRN Ativan for agitation in >24 hours. - Discontinue Haldol and PRN Ativan - Continue Ativan 0.5 mg qd scheduled  AKI: Resolved. Unsure of baseline Cr but one month ago was 0.69. On admission  1.31. Patient did receive IV contrast in ED for CT scan. Also with decreased PO intake likely a prerenal etiology as well. BUN/Cr ratio >20.  Cr improved to 0.76 (12/27).  - Avoid nephrotoxic agents including contrast - I/Os  Hypotension: Resolved. Most likely due to volume losses from high ileostomy output and inadequate PO intake (patient off TPN now but not eating and refusing to resume TPN). BP 100s/low 70s overnight.  - Encourage increased PO intake  Social: Patient has agreed to SNF placement with goal of regaining strength to became candidate for palliative chemo. Patient's sister scheduled to meet with palliative care today to discuss goals of care.  - SW Consulted: Working on SNF placement  FEN/GI: reg diet Prophylaxis: Xarelto  Dispo: SNF pending adequate pain control on PO medications.   Subjective:  Doing "ok." Pain well controlled. No chest pain or shortness of breath.  Objective: Temp:  [97.3 F (36.3 C)-98.7 F (37.1 C)] 98.3 F (36.8 C) (12/29 0423) Pulse Rate:  [100-111] 100 (12/29 0506) Resp:  [18-29] 18 (12/29 0506) BP: (99-123)/(63-94) 107/71 mmHg (12/29 0506) SpO2:  [90 %-97 %] 94 % (12/29 0506) Physical Exam: General: ill-appearing, cachectic, lying in bed in NAD Neuro: A&Ox4, no focal deficits  Cardiovascular: RRR, no murmurs appreciated Respiratory: CTAB, no wheezes,  Abdomen: +BS, soft, non-distended, non-tender, ostomy bag in place with stool and gas in it Extremities: Pitting edema to bilateral LE  Laboratory/Imaging/Diagnostic Tests:  Recent Labs Lab 09/07/15 0210 09/07/15 2200 09/08/15 0503 09/09/15 0600  WBC 16.7*  --  16.2* 14.8*  HGB 6.9* 9.3* 9.5* 9.4*  HCT 21.1* 28.6* 29.5* 29.3*  PLT 79*  --  87* 88*    Recent Labs Lab 09/06/15 0404 09/06/15 1500 09/07/15 0210 09/08/15 0503 09/09/15 0600 09/10/15 0500  NA 135  --  127* 136 135 132*  K 4.1  --  3.5 3.5 3.2* 3.5  CL 104  --  98* 106 105 105  CO2 21*  --  20* 22 22 22   BUN <5*  --  <5* <5* 6 6  CREATININE 0.66  --  0.81 0.76 0.73 0.70  CALCIUM 7.8*  --  6.9* 7.3* 7.2* 7.3*  PROT 4.9*  --  4.8*  --   --   --    BILITOT 3.9* 4.1* 4.8*  --   --   --   ALKPHOS 1099*  --  874*  --   --   --   ALT 24  --  27  --   --   --   AST 48*  --  55*  --   --   --   GLUCOSE 112*  --  395* 90 106* 108*   Aurelia, MD 09/10/2015, 8:43 AM PGY-2, [facility] Intern pager: [phone], text pages welcome

## 2015-09-10 NOTE — Consult Note (Signed)
WOC offered to change pouch 2 x this am.  At my second visit PT just about to get patient up out of bed.  He request pouch be changed tom or over the weekend. Pouch changed last on Tuesday. Will request bedside nursing staff to change Saturday.   Supplies in the room  [place], [place]

## 2015-09-10 NOTE — Progress Notes (Signed)
ANTICOAGULATION CONSULT NOTE - Initial Consult  Pharmacy Consult:  Xarelto Indication:  New B/L femoral vein DVTs + small PE   No Known Allergies  Patient Measurements: Height: 5\' 8"  (172.7 cm) Weight: 175 lb 12.8 oz (79.742 kg) IBW/kg (Calculated) : 68.4  Vital Signs: Temp: 97.3 F (36.3 C) (12/29 0902) Temp Source: Oral (12/29 0902) BP: 110/70 mmHg (12/29 0902) Pulse Rate: 94 (12/29 0902)  Labs:  Recent Labs  09/07/15 2200 09/08/15 0503 09/08/15 1248 09/09/15 0600 09/10/15 0500  HGB 9.3* 9.5*  --  9.4*  --   HCT 28.6* 29.5*  --  29.3*  --   PLT  --  87*  --  88*  --   LABPROT  --   --  32.4* 23.7* 23.8*  INR  --   --  3.24* 2.13* 2.15*  CREATININE  --  0.76  --  0.73 0.70    Estimated Creatinine Clearance: 103.3 mL/min (by C-G formula based on Cr of 0.7).   Medical History: Past Medical History  Diagnosis Date  . DVT (deep venous thrombosis) ([place]) dx'd ~ 08/22/2015    RLE  . Small bowel obstruction ([place]) 08/20/2015  . Abdominal mass 09/07/2015  . Headache     "weekly" (09/11/2015)  . Colon cancer ([place]) dx'd 09/10/2015    stage IV/notes 09/07/2015      Assessment: 6 YOM with new bilateral femoral vein DVTs and small PE previously on IV heparin that was switched to Xarelto on 09/03/15.  Xarelto was on hold since 09/06/15 due to elevated INR per MD.  Pharmacy consulted to resume Xarelto today.  Patient has been thrombocytopenic and his alkaline phosphatase and AST were elevated on 09/07/15.  No overt bleeding documented.   Goal of Therapy:  Full anticoagulation Monitor platelets by anticoagulation protocol: Yes    Plan:  - Restart Xarelto 15mg  PO BIDWM x 21 days, then on 10/01/15 start 20mg  daily with supper - Recommend stopping INR check as it does not correlate with being anticoagulated - Consider using Lovenox in setting of cancer - CBC Q72H given thrombocytopenia, recent surgery, mildly elevated LFT, coagulopathy - Pharmacy will sign off and  follow peripherally.  Thank you for the consult!    Shavonna Corella D. Mina Marble, PharmD, BCPS Pager:  [phone] 09/10/2015, 10:36 AM

## 2015-09-11 DIAGNOSIS — R531 Weakness: Secondary | ICD-10-CM

## 2015-09-11 LAB — CBC
HCT: 26.6 % — ABNORMAL LOW (ref 39.0–52.0)
HCT: 25.6 % — ABNORMAL LOW (ref 39.0–52.0)
HEMOGLOBIN: 8.5 g/dL — AB (ref 13.0–17.0)
HEMOGLOBIN: 8.3 g/dL — AB (ref 13.0–17.0)
MCH: 28.1 pg (ref 26.0–34.0)
MCH: 28.5 pg (ref 26.0–34.0)
MCHC: 32 g/dL (ref 30.0–36.0)
MCHC: 32.4 g/dL (ref 30.0–36.0)
MCV: 88.1 fL (ref 78.0–100.0)
MCV: 88 fL (ref 78.0–100.0)
PLATELETS: 72 10*3/uL — AB (ref 150–400)
Platelets: 73 10*3/uL — ABNORMAL LOW (ref 150–400)
RBC: 3.02 MIL/uL — AB (ref 4.22–5.81)
RBC: 2.91 MIL/uL — AB (ref 4.22–5.81)
RDW: 17.7 % — ABNORMAL HIGH (ref 11.5–15.5)
RDW: 17.7 % — ABNORMAL HIGH (ref 11.5–15.5)
WBC: 15.2 10*3/uL — AB (ref 4.0–10.5)
WBC: 14.3 10*3/uL — AB (ref 4.0–10.5)

## 2015-09-11 LAB — BASIC METABOLIC PANEL
Anion gap: 8 (ref 5–15)
BUN: 5 mg/dL — ABNORMAL LOW (ref 6–20)
CHLORIDE: 104 mmol/L (ref 101–111)
CO2: 21 mmol/L — ABNORMAL LOW (ref 22–32)
Calcium: 7.4 mg/dL — ABNORMAL LOW (ref 8.9–10.3)
Creatinine, Ser: 0.78 mg/dL (ref 0.61–1.24)
Glucose, Bld: 98 mg/dL (ref 65–99)
POTASSIUM: 3.5 mmol/L (ref 3.5–5.1)
SODIUM: 133 mmol/L — AB (ref 135–145)

## 2015-09-11 LAB — CULTURE, BLOOD (ROUTINE X 2)
CULTURE: NO GROWTH
Culture: NO GROWTH

## 2015-09-11 LAB — PROTIME-INR
INR: 4.38 — AB (ref 0.00–1.49)
PROTHROMBIN TIME: 40.7 s — AB (ref 11.6–15.2)

## 2015-09-11 MED ORDER — DIPHENHYDRAMINE HCL 50 MG/ML IJ SOLN
12.5000 mg | Freq: Four times a day (QID) | INTRAMUSCULAR | Status: DC | PRN
Start: 2015-09-11 — End: 2015-09-12

## 2015-09-11 MED ORDER — HYDROMORPHONE HCL 1 MG/ML IJ SOLN
2.0000 mg | INTRAMUSCULAR | Status: DC
  Administered 2015-09-11 – 2015-09-12 (×5): 2 mg via INTRAVENOUS
  Filled 2015-09-11 (×5): qty 2

## 2015-09-11 MED ORDER — OXYCODONE HCL 5 MG PO TABS
10.0000 mg | ORAL_TABLET | ORAL | Status: DC | PRN
  Administered 2015-09-11 (×2): 10 mg via ORAL
  Filled 2015-09-11 (×2): qty 2

## 2015-09-11 MED ORDER — DIPHENHYDRAMINE HCL 25 MG PO CAPS
25.0000 mg | ORAL_CAPSULE | Freq: Four times a day (QID) | ORAL | Status: DC | PRN
  Filled 2015-09-11: qty 1

## 2015-09-11 MED ORDER — OXYCODONE HCL ER 15 MG PO T12A
45.0000 mg | EXTENDED_RELEASE_TABLET | Freq: Two times a day (BID) | ORAL | Status: DC
  Filled 2015-09-11: qty 3

## 2015-09-11 MED ORDER — METRONIDAZOLE IN NACL 5-0.79 MG/ML-% IV SOLN
500.0000 mg | Freq: Three times a day (TID) | INTRAVENOUS | Status: DC
  Administered 2015-09-11 – 2015-09-12 (×3): 500 mg via INTRAVENOUS
  Filled 2015-09-11 (×5): qty 100

## 2015-09-11 MED ORDER — LOPERAMIDE HCL 1 MG/5ML PO LIQD
2.0000 mg | Freq: Three times a day (TID) | ORAL | Status: DC
  Administered 2015-09-11 – 2015-09-12 (×2): 2 mg via ORAL
  Filled 2015-09-11 (×5): qty 10

## 2015-09-11 NOTE — Clinical Social Work Note (Signed)
CSW attempted to see patient to update him on SNF placement.  Patient was sleeping and did not wake up, CSW will try to see patient at a later time.  Jones Broom. Laie, MSW, [place] 09/11/2015 1:29 PM

## 2015-09-11 NOTE — Progress Notes (Signed)
14 Days Post-Op  Subjective: He looks tired and very down/  I got his staples our and steri stripped the wound.  He can shower now.  His ostomy out put looks bloody.  He doesn't seem to be eating very much.  As noted below his INR is 4  Objective: Vital signs in last 24 hours: Temp:  [97.3 F (36.3 C)-98.3 F (36.8 C)] 98.3 F (36.8 C) (12/30 0401) Pulse Rate:  [94-113] 110 (12/30 0401) Resp:  [18-21] 18 (12/30 0401) BP: (100-110)/(58-72) 110/72 mmHg (12/30 0401) SpO2:  [92 %-96 %] 92 % (12/30 0401) Last BM Date: 09/11/15 PO 240 recorded Urine 325 recorded 2335 from ileostomy IV 2400 ml Afebrile, still tachycardic HR 110, BP stable WBC 15.2, platelets 73 K INR 4.38 Intake/Output from previous day: 12/29 0701 - 12/30 0700 In: 2640 [P.O.:240; I.V.:2400] Out: 2660 [Urine:325; [id number] Intake/Output this shift:    General appearance: alert, cooperative, no distress and tired and anxious, dosen't want breakfast right now. GI: soft, non-tender; bowel sounds normal; no masses,  no organomegaly and staples removed without issue, wound is intact, and steri strips applied.  Ostomy output looks bloody.  Lab Results:   Recent Labs  09/09/15 0600 09/11/15 0424  WBC 14.8* 15.2*  HGB 9.4* 8.5*  HCT 29.3* 26.6*  PLT 88* 73*    BMET  Recent Labs  09/10/15 0500 09/11/15 0424  NA 132* 133*  K 3.5 3.5  CL 105 104  CO2 22 21*  GLUCOSE 108* 98  BUN 6 5*  CREATININE 0.70 0.78  CALCIUM 7.3* 7.4*   PT/INR  Recent Labs  09/10/15 0500 09/11/15 0424  LABPROT 23.8* 40.7*  INR 2.15* 4.38*     Recent Labs Lab 09/06/15 0404 09/06/15 1500 09/07/15 0210  AST 48*  --  55*  ALT 24  --  27  ALKPHOS 1099*  --  874*  BILITOT 3.9* 4.1* 4.8*  PROT 4.9*  --  4.8*  ALBUMIN 1.5*  --  1.4*     Lipase     Component Value Date/Time   LIPASE 31 08/20/2015 0940     Studies/Results: No results found.  Medications: . antiseptic oral rinse  7 mL Mouth Rinse BID  . feeding  supplement (ENSURE ENLIVE)  237 mL Oral BID BM  . levofloxacin  750 mg Oral Daily  . lip balm   Topical BID  . loperamide  2 mg Oral 3 times per day  . LORazepam  0.5 mg Intravenous Daily  . metroNIDAZOLE  500 mg Oral 3 times per day  . oxyCODONE  30 mg Oral Q12H  . rivaroxaban  15 mg Oral BID WC   Followed by  . [START ON 10/01/2015] rivaroxaban  20 mg Oral Q supper  . sodium chloride  10-40 mL Intracatheter Q12H    Assessment/Plan Intraabdominal Metastatic cancer, colon cancer with massive cecal dilatation Liver, left adrenal and mesenteric nodal metastasis  S/p EXPLORATORY LAPAROTOMY, PARTIAL COLECTOMY, ILEOSTOMY, MUCUS FISTULA, 08/27/2015, Dr. Coralie Keens Bilateral common femoral venous thrombosis/PE on CT 09/06/15; now on Xarelto Coagulopathy with INR 4.38, on Xarelto Small intermediate lung nodules Hyponatremia Acute renal insuffiencey now resolved Elevated LFT's related to liver metastasis Weight loss 30-40 lbs over 1.5 months Malnourished/deconditioning. Anemia/ transfused 09/07/15 2 units PRBC  Plan:  From a surgical standpoint, he is stable.  Will defer to Medical management and Palliative service.  He can shower and get incision wet any time.  LOS: 16 days    Oluwatimilehin Balfour 09/11/2015

## 2015-09-11 NOTE — Progress Notes (Signed)
Was called by former resident and patient's friend Dr. Barbaraann Faster who shared concern about a left-sided facial droop. Evaluated patient at bedside. His wife was present and says she first noticed a change in his smile yesterday, but that the asymmetry is much more noticeable today. She shared a picture from Christmas Eve when his smile was even. She and the patient also shared concern for new left-sided upper extremity weakness today. In addition, they requested that all PO medications be switched back to IV, as he is having difficulty swallowing and plans to leave the hospital with his PICC line (had been placed to give TPN).   On neurological exam, CNII-XII were grossly intact except for asymmetry of patient's smile with weakness on the left. PERRLA. EOMI. Regarding strength, patient had 4+/5 strength in both upper extremities with very noticeable contrast in grip strength with 5/5 grip strength on right and 4/5 grip strength on left. Strength of LEs was 4+/5 bilaterally. Babinski reflex negative. Discussed risks of anticoagulation with patient and his wife. Xarelto had been held after this morning's dose due to bleeding noticed in ostomy bag and in oral secretions. Brought up the option of getting an MRI to evaluate further, and patient and his wife said they would think about it overnight.   Patient's imodium was switched to solution. Oxycodone was switched to dilaudid IV, 2 mg q4h with 1 mg q4h prn for breakthrough pain, which he had been on previously for pain control. Benadryl for itching was switched to IV, as well.

## 2015-09-11 NOTE — Progress Notes (Signed)
Chaplain provided follow up pastoral visit for patient, and also his sister was present at the time of this visit. Chaplain provided emotional support, and prayer of health and healing.  Both patient and sister were appreciative of the visit, Chaplain was encouraged to continue visitation for patient. [place] [phone]

## 2015-09-11 NOTE — Progress Notes (Signed)
Daily Progress Note   Patient Name: Jesse Murray       Date: 09/11/2015 DOB: 09-Mar-1961  Age: 54 y.o. MRN#: [id number] Attending Physician: Kinnie Feil, MD Primary Care Physician: No PCP Per Patient Admit Date: 09/02/2015  Reason for Consultation/Follow-up: Establishing goals of care, Non pain symptom management, Pain control and Psychosocial/spiritual support  Subjective:  -I meet at bedside with sister Amy and had continued conversation regarding diagnosis, prognosis, GOC, EOL wishes disposition and options.  Plan continues with treat the treatable in hopes of improvement with dc to SNF for rehabilitation in hopes of chemotherapy treatment   His sister continues to verbalizes her worry that this is not going to happen and that he will die in the hospital.  Emotional support  Offered  -lots of concerns regarding finacial situation, placed SW consult and request for financial counselor  Length of Stay: 16 days  Current Medications: Scheduled Meds:  . antiseptic oral rinse  7 mL Mouth Rinse BID  . feeding supplement (ENSURE ENLIVE)  237 mL Oral BID BM  . levofloxacin  750 mg Oral Daily  . lip balm   Topical BID  . loperamide  2 mg Oral 3 times per day  . LORazepam  0.5 mg Intravenous Daily  . metroNIDAZOLE  500 mg Oral 3 times per day  . oxyCODONE  45 mg Oral Q12H  . sodium chloride  10-40 mL Intracatheter Q12H    Continuous Infusions: . dextrose 5 % and 0.45% NaCl 100 mL/hr at 09/11/15 0102    PRN Meds: acetaminophen, acetaminophen, alum & mag hydroxide-simeth, bismuth subsalicylate, HYDROmorphone (DILAUDID) injection, ondansetron (ZOFRAN) IV, sodium chloride  Physical Exam: Physical Exam  Constitutional: He appears lethargic. He appears cachectic. He appears ill.    HENT:  Mouth/Throat: Mucous membranes are normal. No oropharyngeal exudate.  Cardiovascular: Normal rate, regular rhythm and normal heart sounds.   Pulmonary/Chest: He has decreased breath sounds in the right lower field and the left lower field.  Abdominal: There is generalized tenderness.  - noted iliostomy  Neurological: He appears lethargic. He displays atrophy.  Skin: Skin is warm and dry. There is pallor.  Warm and dry                Vital Signs: BP 95/56 mmHg  Pulse 102  Temp(Src) 97.8  F (36.6 C) (Axillary)  Resp 20  Ht 5\' 8"  (1.727 m)  Wt 79.742 kg (175 lb 12.8 oz)  BMI 26.74 kg/m2  SpO2 96% SpO2: SpO2: 96 % O2 Device: O2 Device: Nasal Cannula O2 Flow Rate: O2 Flow Rate (L/min): 3 L/min  Intake/output summary:   Intake/Output Summary (Last 24 hours) at 09/11/15 1412 Last data filed at 09/11/15 1358  Gross per 24 hour  Intake 3676.67 ml  Output    900 ml  Net 2776.67 ml   LBM: Last BM Date: 09/11/15 Baseline Weight: Weight: 68.04 kg (150 lb) Most recent weight: Weight: 79.742 kg (175 lb 12.8 oz)       Palliative Assessment/Data: Flowsheet Rows        Most Recent Value   Intake Tab    Referral Department  -- [Family Medicine]   Unit at Time of Referral  Med/Surg Unit   Palliative Care Primary Diagnosis  Cancer   Date Notified  08/18/2015   Palliative Care Type  New Palliative care   Reason for referral  Clarify Goals of Care   Date of Admission  09/03/2015   Date first seen by Palliative Care  09/10/2015   # of days Palliative referral response time  0 Day(s)   # of days IP prior to Palliative referral  0   Clinical Assessment    Psychosocial & Spiritual Assessment    Palliative Care Outcomes       Additional Data Reviewed: CBC    Component Value Date/Time   WBC 15.2* 09/11/2015 0424   WBC 11.1* 07/14/2015 1745   RBC 3.02* 09/11/2015 0424   RBC 4.22* 07/14/2015 1745   HGB 8.5* 09/11/2015 0424   HGB 12.3* 07/14/2015 1745   HCT 26.6* 09/11/2015  0424   HCT 36.5* 07/14/2015 1745   PLT 73* 09/11/2015 0424   MCV 88.1 09/11/2015 0424   MCV 86.3 07/14/2015 1745   MCH 28.1 09/11/2015 0424   MCH 29.2 07/14/2015 1745   MCHC 32.0 09/11/2015 0424   MCHC 33.8 07/14/2015 1745   RDW 17.7* 09/11/2015 0424   LYMPHSABS 2.0 09/07/2015 0210   MONOABS 0.7 09/07/2015 0210   EOSABS 0.5 09/07/2015 0210   BASOSABS 0.0 09/07/2015 0210    CMP     Component Value Date/Time   NA 133* 09/11/2015 0424   K 3.5 09/11/2015 0424   CL 104 09/11/2015 0424   CO2 21* 09/11/2015 0424   GLUCOSE 98 09/11/2015 0424   BUN 5* 09/11/2015 0424   CREATININE 0.78 09/11/2015 0424   CREATININE 0.69* 07/14/2015 1732   CALCIUM 7.4* 09/11/2015 0424   PROT 4.8* 09/07/2015 0210   ALBUMIN 1.4* 09/07/2015 0210   AST 55* 09/07/2015 0210   ALT 27 09/07/2015 0210   ALKPHOS 874* 09/07/2015 0210   BILITOT 4.8* 09/07/2015 0210   GFRNONAA >60 09/11/2015 0424   GFRAA >60 09/11/2015 0424       Problem List:  Patient Active Problem List   Diagnosis Date Noted  . Delirium due to general medical condition   . Colon cancer ([place])   . Colostomy in place [facility])   . Confusion   . Fever   . Tachypnea   . 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 Protein-calorie malnutrition, severe ([place]) 09/02/2015  . 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 SBO (small bowel obstruction) from colon cancer s/p colectomy/ileostomy 08/29/2015 09/02/2015  . Bilateral leg edema   . Malignant neoplasm of hepatic flexure ([place])   . Metastatic cancer ([place])   .  Metastatic colon cancer to liver ([place]) 09/01/2015  . Leg pain, bilateral   . Absolute anemia   . Acute deep vein thrombosis (DVT) of proximal vein of lower extremity ([facility])   . Palliative care encounter 08/27/2015  . DNR (do not resuscitate) discussion 08/27/2015  . Pain, cancer 08/27/2015  . 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 Abdominal pain 09/05/2015     Palliative Care Assessment & Plan     1.Code Status:  Partial code documented today; no CPR, compression, intubation .    Code Status Orders        Start     Ordered   08/14/2015 1500  Full code   Continuous     09/11/2015 1459       2. Goals of Care/Additional Recommendations:  Continue current treatment plan, patient is hopeful for improvement and the possibility of treatment for his cancer  Desire for further Chaplaincy support:no-strong community church support  Psycho-social Needs: Education on Hospice and Medicaid/Financial Assistance  3. Symptom Management:       1.  Pain:  Patient is now taking Dilaudid 2 mg every 4 hrs IV prescribed by attending    Discussed with Family Medicine/ Dr Andria Frames need for pain management medications be converted to oral agents prior  disposition     4. Palliative Prophylaxis:    Aspiration, Bowel Regimen, Delirium Protocol, Frequent Pain Assessment, Oral Care and Turn Reposition  5. Prognosis:  Weeks to months.    I worry that Octavia Bruckner will be unable to ralley after this surgery and get strong enough to tolerate palliative chemotherapy.  Nutrition will be crititcal and his current albumin is 1.5.  His sister Amy is now here in [place] and plans to stay and this family will need continued support and guidance   6. Discharge Planning:   When patient is medically stable discharge to SNF for rehabilitation   Care plan was discussed with Dr Andria Frames  Thank you for allowing the Palliative Medicine Team to assist in the care of this patient.   Time In: 1115 Time Out: 1200 Total Time 45  min Prolonged Time Billed  no         Knox Royalty, NP  09/11/2015, 2:12 PM  Please contact Palliative Medicine Team phone at [phone] for questions and concerns.

## 2015-09-11 NOTE — Progress Notes (Signed)
Family Medicine Teaching Service Daily Progress Note Intern Pager: [phone]  Patient name: Jesse Murray Medical record number: [id number] Date of birth: [birth year] Age: 54 y.o. 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 male presenting with abdominal pain, as well as bilateral leg swelling and pain. PMH is significant for EtOH abuse.   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 Ex-lap 12/16 found primary to be most likely pancreatic that is invading hepatic flexure; unresectable but ileostomy was done to help alleviate symptoms.  - Pain control: Increase Oxycontin to 45mg  bid as patient's pain is not being controlled with 30 mg BID. Continue dilaudid 1mg  IV prn. Likely transition from IV Dilaudid to PO Oxycodone IR.  - Palliative care continuing to follow pt - appreciate recs  - PT/OT consulted--> recommend SNF placement - Wound care following  - Now has scheduled Imodium per surgery due to increased ileostomy output  Fever: Likely HAP (CXR with opacities consistent with PNA). Possible GI source given recent surgery. Lactic acid WNL (12/24). WBC improved to 16.2 (12/27).  Bcx with insignificant growth (but collected after  beginning abx).  Last febrile 12/26. - Wean supplementary oxygen as needed - ABx: Vanc/Zosyn - Day 5 (12/24 >>12/28), Levo/Flagyl 12/28>> to continue for 10 days to cover for potential GI and Respiratory source of infection  Elevated ALP and Bili: Question if related to liver/gallbladder as AST mildly elevated and ALT WNL. PT, PTT, INR, gamma GT elevated. CT abd showed already known stable appearance of heavy burden of hepatic metastatic disease but no new findings. T.bili 4.8 (12/26).  - Continue to monitor  Bilateral DVTs: Femoral thrombosis noted on CTA abdomen and pelvis. CTA chest showed small pulmonary embolus as well as pulmonary edema with small bilateral pleural effusions. Patient transitioned from heparin to Xarelto for long-term anticoagulation on 12/22, however Xarelto held beginning 12/25 given INR 6.77. Xarelto restarted on 12/29 per pharmacy. INR elevated today 4.38 -Trend INR - DC Xarelto at this time as patient is bleeding. INR elevated, hgb has dropped, and platelets dropping  Pressure ulcer: Stage II in gluteal cleft with serosanguinous drainage. Patient at increased risk due to malnutrition, relative immobility, and increased moisture. Foam island added 12/23.  - Wound care continuing to follow - will change foam island q3 days - Prescribed pressure-distributing chair cushion  Anemia: Hgb 9.7 on admission (12.3 one month ago). Potentially related to malignancy. FOBT negative. Vitals are stable although mildly tachycardic. Patient is bleeding today in mouth and in ostomy bag. Hgb dropped today from 9.4 --> 8.5 today. - Daily H&H. Will get CBC at 2 PM today - Transfusion threshold <7  Thrombocytopenia: 4T score = 2; unlikely HIT. Possibly related to malignancy. Patient is bleeding today in mouth and some blood in ostomy bag. Platelets also decreased from 88--> 73 today. -  Continue to monitor   AMS, resolved: Etiology unknown. Possible due to HAP vs delirium 2/2 pain vs PE vs  mets to brain. CTA showed small PEs which may be contributing. UA not suggestive of UTI. Continues to be oriented to person, place, time, and situation. Has not required Haldol or PRN Ativan for agitation in >24 hours. - Continue to discontinue Haldol and PRN Ativan - Continue Ativan 0.5 mg qd scheduled  AKI: Resolved.  - Avoid nephrotoxic agents including contrast - I/Os  Hypotension: Resolved. Most likely due to volume losses from high ileostomy output and inadequate PO intake (patient off TPN now but not eating and refusing to resume TPN). BP 100s/low 70s overnight.  - Encourage increased PO intake  Social: Patient has agreed to SNF placement with goal of regaining strength to became candidate for palliative chemo. Patient's sister scheduled to meet with palliative care today to discuss goals of care.  - SW Consulted: Working on SNF placement  FEN/GI: reg diet Prophylaxis: Xarelto  Dispo: SNF pending adequate pain control on PO medications.   Subjective:  -Patient is in a lot of pain this morning (8/10), complaining of pain in his abdomen near ostomy site.  -Was receiving PRN Dilaudid from nurse while in exam room.  -Not eating much and refused breakfast at this time - Is drinking juice and gatorade   Objective: Temp:  [97.3 F (36.3 C)-98.3 F (36.8 C)] 98.3 F (36.8 C) (12/30 0401) Pulse Rate:  [94-113] 110 (12/30 0401) Resp:  [18-21] 18 (12/30 0401) BP: (100-110)/(58-72) 110/72 mmHg (12/30 0401) SpO2:  [92 %-96 %] 92 % (12/30 0401) Physical Exam: General: ill-appearing, cachectic, lying in bed, is in pain. Nurse was giving him a PRN dilaudid  HEENT: Icteric sclera. Blood in mouth and on teeth. Possible mucositis on palate Neuro: A&Ox4, no focal deficits  Cardiovascular: RRR, no murmurs appreciated Respiratory: CTAB, no wheezes,  Abdomen: +BS, soft, non-distended, non-tender, ostomy bag in place with stool and gas in it. Blood noticed in ostomy bag today. Extremities:  2-3+ Pitting edema to bilateral LE Skin: Jaundiced. Healing incision on abdomen clean, dry, and intact  Laboratory/Imaging/Diagnostic Tests:  Recent Labs Lab 09/08/15 0503 09/09/15 0600 09/11/15 0424  WBC 16.2* 14.8* 15.2*  HGB 9.5* 9.4* 8.5*  HCT 29.5* 29.3* 26.6*  PLT 87* 88* 73*    Recent Labs Lab 09/06/15 0404 09/06/15 1500 09/07/15 0210  09/09/15 0600 09/10/15 0500 09/11/15 0424  NA 135  --  127*  < > 135 132* 133*  K 4.1  --  3.5  < > 3.2* 3.5 3.5  CL 104  --  98*  < > 105 105 104  CO2 21*  --  20*  < > 22 22 21*  BUN <5*  --  <5*  < > 6 6 5*  CREATININE 0.66  --  0.81  < > 0.73 0.70 0.78  CALCIUM 7.8*  --  6.9*  < > 7.2* 7.3* 7.4*  PROT 4.9*  --  4.8*  --   --   --   --   BILITOT 3.9* 4.1* 4.8*  --   --   --   --   ALKPHOS 1099*  --  874*  --   --   --   --   ALT 24  --  27  --   --   --   --   AST 48*  --  55*  --   --   --   --  GLUCOSE 112*  --  395*  < > 106* 108* 98  < > = values in this interval not displayed. Carlyle Dolly, MD 09/11/2015, 7:12 AM PGY-1, [place] Intern pager: [phone], text pages welcome

## 2015-09-11 NOTE — Progress Notes (Signed)
PT Cancellation Note  Patient Details Name: Jesse Murray MRN: [id number] DOB: 11-21-1960   Cancelled Treatment:    Reason Eval/Treat Not Completed: Medical issues which prohibited therapy (INR >4 and per guidelines not safe for mobility at this time)   Melford Aase 09/11/2015, 11:15 AM Elwyn Reach, [place]

## 2015-09-11 NOTE — Progress Notes (Signed)
Daily Progress Note   Patient Name: Jesse Murray       Date: 09/11/2015 DOB: 1961/09/05  Age: 54 y.o. MRN#: [id number] Attending Physician: Kinnie Feil, MD Primary Care Physician: No PCP Per Patient Admit Date: 09/05/2015  Reason for Consultation/Follow-up: Establishing goals of care, Non pain symptom management, Pain control and Psychosocial/spiritual support  Subjective:  -I meet at bedside with patient he looks weaker to me today.  He is tired but engages.  He verbalizes his desire to continue "to fight, no one wants to die".  He is not fearful "just has more to do".  -he anticipates discharge to a SNF to try to rehab and "get stronger"  -I let [name] know I was not working the next three days but how to  get in touch with other providers of PMT    Length of Stay: 16 days  Current Medications: Scheduled Meds:  . antiseptic oral rinse  7 mL Mouth Rinse BID  . feeding supplement (ENSURE ENLIVE)  237 mL Oral BID BM  . levofloxacin  750 mg Oral Daily  . lip balm   Topical BID  . loperamide  2 mg Oral 3 times per day  . LORazepam  0.5 mg Intravenous Daily  . metroNIDAZOLE  500 mg Oral 3 times per day  . oxyCODONE  45 mg Oral Q12H  . sodium chloride  10-40 mL Intracatheter Q12H    Continuous Infusions: . dextrose 5 % and 0.45% NaCl 100 mL/hr at 09/11/15 1530    PRN Meds: acetaminophen, acetaminophen, alum & mag hydroxide-simeth, bismuth subsalicylate, HYDROmorphone (DILAUDID) injection, ondansetron (ZOFRAN) IV, oxyCODONE, sodium chloride  Physical Exam: Physical Exam  Constitutional: He appears lethargic. He appears cachectic. He appears ill.  HENT:  Mouth/Throat: Mucous membranes are normal. No oropharyngeal exudate.  Cardiovascular: Normal rate, regular rhythm and  normal heart sounds.   Pulmonary/Chest: He has decreased breath sounds in the right lower field and the left lower field.  Abdominal: There is generalized tenderness.  - noted iliostomy  Neurological: He appears lethargic. He displays atrophy.  Skin: Skin is warm and dry. There is pallor.  Warm and dry   jaundice.              Vital Signs: BP 95/56 mmHg  Pulse 102  Temp(Src) 97.8 F (36.6  C) (Axillary)  Resp 20  Ht 5\' 8"  (1.727 m)  Wt 79.742 kg (175 lb 12.8 oz)  BMI 26.74 kg/m2  SpO2 96% SpO2: SpO2: 96 % O2 Device: O2 Device: Nasal Cannula O2 Flow Rate: O2 Flow Rate (L/min): 3 L/min  Intake/output summary:   Intake/Output Summary (Last 24 hours) at 09/11/15 1809 Last data filed at 09/11/15 1446  Gross per 24 hour  Intake 3676.67 ml  Output   1900 ml  Net 1776.67 ml   LBM: Last BM Date: 09/11/15 Baseline Weight: Weight: 68.04 kg (150 lb) Most recent weight: Weight: 79.742 kg (175 lb 12.8 oz)       Palliative Assessment/Data: Flowsheet Rows        Most Recent Value   Intake Tab    Referral Department  -- [Family Medicine]   Unit at Time of Referral  Med/Surg Unit   Palliative Care Primary Diagnosis  Cancer   Date Notified  08/25/2015   Palliative Care Type  New Palliative care   Reason for referral  Clarify Goals of Care   Date of Admission  09/09/2015   Date first seen by Palliative Care  08/27/2015   # of days Palliative referral response time  0 Day(s)   # of days IP prior to Palliative referral  0   Clinical Assessment    Psychosocial & Spiritual Assessment    Palliative Care Outcomes       Additional Data Reviewed: CBC    Component Value Date/Time   WBC 14.3* 09/11/2015 1410   WBC 11.1* 07/14/2015 1745   RBC 2.91* 09/11/2015 1410   RBC 4.22* 07/14/2015 1745   HGB 8.3* 09/11/2015 1410   HGB 12.3* 07/14/2015 1745   HCT 25.6* 09/11/2015 1410   HCT 36.5* 07/14/2015 1745   PLT 72* 09/11/2015 1410   MCV 88.0 09/11/2015 1410   MCV 86.3 07/14/2015 1745    MCH 28.5 09/11/2015 1410   MCH 29.2 07/14/2015 1745   MCHC 32.4 09/11/2015 1410   MCHC 33.8 07/14/2015 1745   RDW 17.7* 09/11/2015 1410   LYMPHSABS 2.0 09/07/2015 0210   MONOABS 0.7 09/07/2015 0210   EOSABS 0.5 09/07/2015 0210   BASOSABS 0.0 09/07/2015 0210    CMP     Component Value Date/Time   NA 133* 09/11/2015 0424   K 3.5 09/11/2015 0424   CL 104 09/11/2015 0424   CO2 21* 09/11/2015 0424   GLUCOSE 98 09/11/2015 0424   BUN 5* 09/11/2015 0424   CREATININE 0.78 09/11/2015 0424   CREATININE 0.69* 07/14/2015 1732   CALCIUM 7.4* 09/11/2015 0424   PROT 4.8* 09/07/2015 0210   ALBUMIN 1.4* 09/07/2015 0210   AST 55* 09/07/2015 0210   ALT 27 09/07/2015 0210   ALKPHOS 874* 09/07/2015 0210   BILITOT 4.8* 09/07/2015 0210   GFRNONAA >60 09/11/2015 0424   GFRAA >60 09/11/2015 0424       Problem List:  Patient Active Problem List   Diagnosis Date Noted  . Weakness generalized   . Delirium due to general medical condition   . Colon cancer ([place])   . Colostomy in place [facility])   . Confusion   . Fever   . Tachypnea   . 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 Protein-calorie malnutrition, severe ([place]) 09/02/2015  . 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 SBO (small bowel obstruction) from colon cancer s/p colectomy/ileostomy 08/29/2015 09/02/2015  . Bilateral leg edema   . Malignant neoplasm of hepatic flexure ([place])   . Metastatic cancer (  [place])   . Metastatic colon cancer to liver ([place]) 09/01/2015  . Leg pain, bilateral   . Absolute anemia   . Acute deep vein thrombosis (DVT) of proximal vein of lower extremity ([facility])   . Palliative care encounter 08/27/2015  . DNR (do not resuscitate) discussion 08/27/2015  . Pain, cancer 08/27/2015  . 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 Abdominal pain 08/30/2015     Palliative Care Assessment & Plan    1.Code Status:  Partial code documented today; no CPR, compression, intubation .    Code  Status Orders        Start     Ordered   08/26/2015 1500  Full code   Continuous     08/25/2015 1459       2. Goals of Care/Additional Recommendations:  Continue current treatment plan, patient is hopeful for improvement and the possibility of treatment for his cancer  Desire for further Chaplaincy support:no-strong community church support  Psycho-social Needs: Education on Hospice and Medicaid/Financial Assistance  3. Symptom Management:  Discussed with patient the importance of utilization of oral agents in anticipation for discharge       1.  Pain:  Continue Oxycodone ER 45 mg po bid                  Add: Oxycodone IR  10 po every 3 hrs prn breakthru  Patient is now taking Dilaudid 2 mg every 4 hrs IV prescribed by attending        4. Palliative Prophylaxis:    Aspiration, Bowel Regimen, Delirium Protocol, Frequent Pain Assessment, Oral Care and Turn Reposition  5. Prognosis:  Weeks to months.    I worry that Octavia Bruckner will be unable to ralley after this surgery and get strong enough to tolerate palliative chemotherapy.  Nutrition will be crititcal and his current albumin is 1.5.  His sister Amy is now here in [place] and plans to stay and this family will need continued support and guidance   6. Discharge Planning:   When patient is medically stable discharge to SNF for rehabilitation   Care plan was discussed with Dr Andria Frames  Thank you for allowing the Palliative Medicine Team to assist in the care of this patient.   Time In:  1600 Time Out: 1625 Total Time 25  min Prolonged Time Billed  no         Knox Royalty, NP  09/11/2015, 6:09 PM  Please contact Palliative Medicine Team phone at [phone] for questions and concerns.

## 2015-09-12 DIAGNOSIS — I639 Cerebral infarction, unspecified: Secondary | ICD-10-CM

## 2015-09-12 LAB — CBC
HCT: 24.9 % — ABNORMAL LOW (ref 39.0–52.0)
HEMOGLOBIN: 8 g/dL — AB (ref 13.0–17.0)
MCH: 28.1 pg (ref 26.0–34.0)
MCHC: 32.1 g/dL (ref 30.0–36.0)
MCV: 87.4 fL (ref 78.0–100.0)
PLATELETS: 79 10*3/uL — AB (ref 150–400)
RBC: 2.85 MIL/uL — AB (ref 4.22–5.81)
RDW: 17.8 % — ABNORMAL HIGH (ref 11.5–15.5)
WBC: 13.8 10*3/uL — AB (ref 4.0–10.5)

## 2015-09-12 LAB — URINALYSIS, ROUTINE W REFLEX MICROSCOPIC
Glucose, UA: NEGATIVE mg/dL
HGB URINE DIPSTICK: NEGATIVE
KETONES UR: 15 mg/dL — AB
Nitrite: POSITIVE — AB
PH: 5.5 (ref 5.0–8.0)
Protein, ur: NEGATIVE mg/dL
SPECIFIC GRAVITY, URINE: 1.016 (ref 1.005–1.030)

## 2015-09-12 LAB — PROTIME-INR
INR: 3.64 — AB (ref 0.00–1.49)
PROTHROMBIN TIME: 35.4 s — AB (ref 11.6–15.2)

## 2015-09-12 LAB — URINE MICROSCOPIC-ADD ON
BACTERIA UA: NONE SEEN
RBC / HPF: NONE SEEN RBC/hpf (ref 0–5)

## 2015-09-12 LAB — AMMONIA: AMMONIA: 45 umol/L — AB (ref 9–35)

## 2015-09-12 MED ORDER — MORPHINE SULFATE (PF) 2 MG/ML IV SOLN: 1.0000 mg | INTRAVENOUS | Status: DC | PRN

## 2015-09-12 MED ORDER — LEVOFLOXACIN IN D5W 750 MG/150ML IV SOLN
750.0000 mg | INTRAVENOUS | Status: DC
  Administered 2015-09-12: 750 mg via INTRAVENOUS
  Filled 2015-09-12: qty 150

## 2015-09-12 MED ORDER — LORAZEPAM 2 MG/ML IJ SOLN
2.0000 mg | INTRAMUSCULAR | Status: DC | PRN
  Administered 2015-09-13 (×2): 2 mg via INTRAVENOUS
  Filled 2015-09-12 (×2): qty 1

## 2015-09-12 MED ORDER — LORAZEPAM 2 MG/ML IJ SOLN
1.0000 mg | Freq: Once | INTRAMUSCULAR | Status: AC
  Administered 2015-09-12: 1 mg via INTRAVENOUS
  Filled 2015-09-12: qty 1

## 2015-09-12 MED ORDER — LORAZEPAM 2 MG/ML IJ SOLN
INTRAMUSCULAR | Status: AC
  Filled 2015-09-12: qty 1

## 2015-09-12 MED ORDER — HALOPERIDOL LACTATE 2 MG/ML PO CONC
0.5000 mg | ORAL | Status: DC | PRN
  Filled 2015-09-12: qty 0.3

## 2015-09-12 MED ORDER — BISACODYL 10 MG RE SUPP: 10.0000 mg | Freq: Every day | RECTAL | Status: DC | PRN

## 2015-09-12 MED ORDER — DIPHENHYDRAMINE HCL 50 MG/ML IJ SOLN
12.5000 mg | INTRAMUSCULAR | Status: DC | PRN
Start: 2015-09-12 — End: 2015-09-14

## 2015-09-12 MED ORDER — POLYVINYL ALCOHOL 1.4 % OP SOLN
1.0000 [drp] | Freq: Four times a day (QID) | OPHTHALMIC | Status: DC | PRN
  Filled 2015-09-12: qty 15

## 2015-09-12 MED ORDER — LORAZEPAM 2 MG/ML IJ SOLN
0.5000 mg | Freq: Once | INTRAMUSCULAR | Status: AC
  Administered 2015-09-12: 0.5 mg via INTRAVENOUS

## 2015-09-12 MED ORDER — BIOTENE DRY MOUTH MT LIQD: 15.0000 mL | OROMUCOSAL | Status: DC | PRN

## 2015-09-12 MED ORDER — MORPHINE SULFATE (PF) 2 MG/ML IV SOLN
2.0000 mg | INTRAVENOUS | Status: DC | PRN
  Administered 2015-09-12: 2 mg via INTRAVENOUS
  Filled 2015-09-12: qty 1

## 2015-09-12 MED ORDER — LORAZEPAM 2 MG/ML IJ SOLN
1.0000 mg | INTRAMUSCULAR | Status: DC | PRN
Start: 2015-09-12 — End: 2015-09-12
  Administered 2015-09-12: 1 mg via INTRAVENOUS
  Filled 2015-09-12: qty 1

## 2015-09-12 MED ORDER — MORPHINE SULFATE (PF) 2 MG/ML IV SOLN
2.0000 mg | INTRAVENOUS | Status: DC | PRN
  Administered 2015-09-12: 4 mg via INTRAVENOUS
  Filled 2015-09-12 (×2): qty 2

## 2015-09-12 MED ORDER — MORPHINE BOLUS VIA INFUSION
1.0000 mg | INTRAVENOUS | Status: DC | PRN
Start: 2015-09-12 — End: 2015-09-13
  Administered 2015-09-12 – 2015-09-13 (×3): 1 mg via INTRAVENOUS
  Filled 2015-09-12 (×4): qty 1

## 2015-09-12 MED ORDER — HALOPERIDOL LACTATE 5 MG/ML IJ SOLN
0.5000 mg | INTRAMUSCULAR | Status: DC | PRN
  Administered 2015-09-12: 0.5 mg via INTRAVENOUS
  Filled 2015-09-12: qty 1

## 2015-09-12 MED ORDER — HALOPERIDOL 1 MG PO TABS: 0.5000 mg | ORAL_TABLET | ORAL | Status: DC | PRN

## 2015-09-12 MED ORDER — MORPHINE SULFATE 25 MG/ML IV SOLN
14.0000 mg/h | INTRAVENOUS | Status: DC
  Administered 2015-09-12: 5 mg/h via INTRAVENOUS
  Filled 2015-09-12 (×2): qty 10

## 2015-09-12 MED ORDER — DEXTROSE 5 % IV SOLN
INTRAVENOUS | Status: DC
  Administered 2015-09-12: 11:00:00 via INTRAVENOUS

## 2015-09-12 MED ORDER — HALOPERIDOL LACTATE 5 MG/ML IJ SOLN: 1.0000 mg | INTRAMUSCULAR | Status: DC | PRN

## 2015-09-12 NOTE — Progress Notes (Signed)
FPTS Interim Progress Note  Went to see the patient and talk to the family with Dr. Teryl Lucy in response to a page I received from patient's nurse who reports that patient family are interested in comfort care as patient continues to be agitated despite the efforts to orientate him. When we arrived in patient's room, patient is lying in bed. He is still agitated. Patient's sister (Amy), who is patient's HCPOA was by bedside. We have confirmed her desire to pursue comfort care at this time as patients condition is deteriorating. We have also ensured that she understands what that entails including withdrawing aggressive measures such imaging, antibiotics and other life prolonging measures. She voiced understanding and agreed to pursue comfort care, which was initiated on 09/12/2015 at 18:34 pm. Patient's sister asked for support or help when the time comes for her brother to leave. We offered her our chaplain service. We have also reassured her that someone is available whenever she have a question or she want to talk to someone.   Mercy Riding, MD 09/12/2015, 6:53 PM PGY-1, [place] Medicine Service pager [phone]

## 2015-09-12 NOTE — Progress Notes (Signed)
Patient's nurse paged to share patient's sister's concern about continued agitation and discomfort. Saw patient at bedside, and he still seemed to be experiencing air hunger. He had received 0.5 mg haldol, 10 mg dilaudid, 3 mg ativan and 6 mg morphine today. Dilaudid was discontinued, and a morphine (5 mL/hr) drip was started. Prn morphine 1 mg q75m was also ordered. Patient now resting 15 minutes after morphine drip started and 1 mg bolus given.

## 2015-09-12 NOTE — Progress Notes (Signed)
Family Medicine Teaching Service Daily Progress Note Intern Pager: [phone]  Patient name: Jesse Murray Medical record number: [id number] Date of birth: 1961/05/23 Age: 54 y.o. Gender: male  Primary Care Provider: No PCP Per Patient Consultants: surgery, oncology, palliative care Code Status: FULL  Patient overview and Major Events to Date: 12/14 CT showing abdominal mass and SBO 12/16 Ex-lap, partial colectomy, ileostomy, mucus fistula on 09/12/2015 (Dr. 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 12/21: Diet advanced to full; weaning off TPN 12/22: Weaned off TPN; transitioned from heparin to Xarelto 12/24: Developed AMS - Transferred to SDU; febrile with CXR suggestive of HAP, began vanc and zosyn; scheduled pain meds 12/26 AMS resolved; received 2U PRBC 12/30: left facial drooping and LUE weakness  Assessment and Plan: Jesse Murray is a 54 y.o. male presenting with abdominal pain, as well as bilateral leg swelling and pain now with worsening overall condition. PMH is significant for EtOH use.  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 Ex-lap 12/16 found primary to be most likely pancreatic that is invading hepatic flexure; unresectable but ileostomy was done to help alleviate symptoms.  - Transition from by mouth pain meds to IV Dilaudid 2 mg q4hrs plus 1 mg q4hr as needed. - Palliative care continuing to follow pt - appreciate recs  - PT/OT consulted--> recommend SNF placement - Wound care following  - Now has scheduled Imodium per surgery due to increased ileostomy output  CVA: patient with new onset left sided facial drooping and LUE weakness concerning for brains stem stroke. He had low BP to 95/56 on 12/30.  - Will consult neuro -  MRI although it is unclear how far we can pursue this as his prognosis from cancer is not great.  Fever: Likely HAP (CXR with opacities consistent with PNA). Possible GI source given recent surgery. Lactic acid WNL (12/24). WBC improved to 16.7 to 13.8 (12/31).  Bcx NGTD (but collected after beginning abx).  Last febrile 12/26. - Sating well on 3L by [place] - ABx: Vanc/Zosyn - Day 5 (12/24 >>12/28), Levo/Flagyl 12/28>> to continue for 10 days to cover for potential GI and Respiratory source of infection  Elevated ALP and Bili: likely obstructive biliary flow with tbili 4.1 and direct bili 2.4. AST mildly elevated and ALT WNL. PT, PTT, INR, gamma GT elevated. CT abd showed already known stable appearance of heavy burden of hepatic metastatic disease but no new findings. - Continue to monitor  Bilateral DVTs: Femoral thrombosis noted on CTA abdomen and pelvis. CTA chest showed small pulmonary embolus as well as pulmonary edema with small bilateral pleural effusions. Patient transitioned from heparin to Xarelto for long-term anticoagulation on 12/22, however Xarelto held beginning 12/25 given INR 6.77. Xarelto restarted on 12/29 per pharmacy with INR of 2.15. However, it was discontinued on 12/30 with INR of 4.38. INR 3.64 today -Trend INR - Continue to hold Xarelto. Pharmacy on board  Pressure ulcer: stage II. improved. Patient at increased risk due to malnutrition, relative immobility, and increased moisture. Foam island added 12/23.  - Wound care continuing to follow - will change foam island q3 days - Prescribed pressure-distributing chair cushion  Anemia: Hgb 9.7 on admission (12.3 one month ago). Potentially related to malignancy. FOBT negative. Vitals are stable although mildly tachycardic. No bleeding noted or reported overnight. Plt stable.  Hgb trended 9.7>>7.0>1uPRBC >9.3>>6.9>2uRBC>9.5>>> 8.0 today. - Daily H&H. - Transfusion threshold <7  Thrombocytopenia: 4T score = 2; unlikely HIT.  Possibly related to malignancy. No more bleeding today. Platelets decreased from 185-->> 79 but stable over last 24 hours. No more blood in ostomy bag per nursing report. - Continue to monitor   AMS, resolved: Etiology unknown. Patient with significant jaundice, appears sleepy suggetive for hepatic encephalopathy. Others on differential are HAP and mets to brain. CTA showed small PEs which may be contributing.  UA not suggestive of UTI. Continues to be oriented to person, place, time, and situation.  - discontinued Haldol and PRN Ativan - Continue Ativan 0.5 mg qd scheduled for anxiety - Will get ammonia level  AKI: Resolved.  - Avoid nephrotoxic agents including contrast - I/Os  Hypotension: Resolved. Most likely due to volume losses from high ileostomy output and inadequate PO intake (patient off TPN now but not eating and refusing to resume TPN). BP 100s/low 70s overnight.  - Encourage increased PO intake  Social: Patient has agreed to SNF placement with goal of regaining strength to became candidate for palliative chemo.  - SW Consulted: Working on SNF placement  FEN/GI:  -NPO. Patient not tolerating regular diet anyhow.  -D5 at 75 ml/hr today and TPN 09/13/2015.  Prophylaxis: Xarelto  Dispo: floor status. Patient with new onset neuro finding suggestive for CVA. Will consider getting MRI.   Subjective:  Patient with new left sided facial drooping and LUE weakness yesterday. Daughter at bedside thinks he is getting worse but likes to pursue aggressive intervention. He is also not tolerating by mouth. There is no episode of bleeding. His INR is still elevated. There is no blood from ostomy bag per his nurse.  Objective: Temp:  [97 F (36.1 C)-98.5 F (36.9 C)] 98.5 F (36.9 C) (12/31 0606) Pulse Rate:  [97-116] 97 (12/31 0606) Resp:  [19-20] 19 (12/31 0606) BP: (95-125)/(56-70) 106/61 mmHg (12/31 0606) SpO2:  [91 %-97 %] 97 % (12/31 0606) Physical Exam: General:  ill-appearing, cachectic, lying in bed.  HEENT: Icteric sclera. Possible mucositis on palate Neuro: PERRL, EOMI, lower CN VII deficit, LUE weakness, intact motor in lower ext bilaterally Cardiovascular: RRR, no murmurs appreciated Respiratory: CTAB, no wheezes,  Abdomen: +BS, soft, non-distended, non-tender, ostomy bag in place and empty. Extremities: 2-3+ Pitting edema to ankle level bilaterally Skin: Jaundiced all over. Healing incision on abdomen clean, dry, and intact  Laboratory/Imaging/Diagnostic Tests:  Recent Labs Lab 09/11/15 0424 09/11/15 1410 09/12/15 0427  WBC 15.2* 14.3* 13.8*  HGB 8.5* 8.3* 8.0*  HCT 26.6* 25.6* 24.9*  PLT 73* 72* 79*    Recent Labs Lab 09/06/15 0404 09/06/15 1500 09/07/15 0210  09/09/15 0600 09/10/15 0500 09/11/15 0424  NA 135  --  127*  < > 135 132* 133*  K 4.1  --  3.5  < > 3.2* 3.5 3.5  CL 104  --  98*  < > 105 105 104  CO2 21*  --  20*  < > 22 22 21*  BUN <5*  --  <5*  < > 6 6 5*  CREATININE 0.66  --  0.81  < > 0.73 0.70 0.78  CALCIUM 7.8*  --  6.9*  < > 7.2* 7.3* 7.4*  PROT 4.9*  --  4.8*  --   --   --   --   BILITOT 3.9* 4.1* 4.8*  --   --   --   --   ALKPHOS 1099*  --  874*  --   --   --   --   ALT 24  --  27  --   --   --   --   AST 48*  --  55*  --   --   --   --   GLUCOSE 112*  --  395*  < > 106* 108* 98  < > = values in this interval not displayed. Mercy Riding, MD 09/12/2015, 8:11 AM PGY-1, [place] Intern pager: [phone], text pages welcome

## 2015-09-12 NOTE — Progress Notes (Signed)
FPTS Interim Progress Note  S: Paged by patients nurse. I called her (the nurse) back, and got a report that patient is agitated and desated to 80's after pulling his nasal canula. She stated that his saturation came back up to 99% on non-rebreather. When I went to see the patient, he is accompanied by two family members and the nurse. He is agitated trying to remove his mask intermittently. He is oriented to person. His speech is hard to understand. He is satting in upper 90's with non-rebreather. One of the family member who was with him overnight and this morning mentioned that she turned down the lights and left when the patient fell asleep earlier. And he was restless and agitated when she came back.  O: BP 119/77 mmHg  Pulse 125  Temp(Src) 98.9 F (37.2 C) (Oral)  Resp 28  Ht 5\' 8"  (1.727 m)  Wt 175 lb 12.8 oz (79.742 kg)  BMI 26.74 kg/m2  SpO2 99%  Gen: appears agitated and restless. Falls asleep intermittently. Hard to understand his speech. Oriented only to person (family members). Denies pain. Oropharynx: appears dry Lungs: good air movement bilaterally, no crackles, wheeze or rhonchi Heart: tachycardic to 100's, regular rhythm, no murmurs Abdomen: soft, ostomy bag in place Skin: jaundice with sclera icterus   A/P: Patient with agitation, waxing and waning MS suggestive for delirium. Gave 0.5 mg ativan once. Recommended reorientation with the help of family members. Advised family members to live the lights and TV on and leave the curtains open. Explained to family members that company by familiar faces is helpful in such situations.  Will continue to monitor. Awaiting on MRI of head as well.   Mercy Riding, MD 09/12/2015, 3:44 PM PGY-1, [place] [facility] Service pager [phone]

## 2015-09-13 MED ORDER — LORAZEPAM BOLUS VIA INFUSION
0.5000 mg | INTRAVENOUS | Status: DC
  Filled 2015-09-13 (×6): qty 1

## 2015-09-13 MED ORDER — MORPHINE BOLUS VIA INFUSION
4.0000 mg | INTRAVENOUS | Status: DC | PRN
  Administered 2015-09-13 (×2): 4 mg via INTRAVENOUS
  Filled 2015-09-13 (×3): qty 4

## 2015-09-13 MED ORDER — LORAZEPAM 2 MG/ML IJ SOLN
1.0000 mg | INTRAMUSCULAR | Status: DC | PRN
  Administered 2015-09-13 (×3): 2 mg via INTRAVENOUS
  Filled 2015-09-13 (×3): qty 1

## 2015-09-13 MED ORDER — MORPHINE BOLUS VIA INFUSION: 4.0000 mg | INTRAVENOUS | Status: DC | PRN

## 2015-09-13 MED ORDER — LORAZEPAM 2 MG/ML IJ SOLN
1.0000 mg | INTRAMUSCULAR | Status: AC
  Administered 2015-09-13: 1 mg via INTRAVENOUS
  Filled 2015-09-13: qty 1

## 2015-09-13 MED ORDER — GLYCOPYRROLATE 0.2 MG/ML IJ SOLN
0.2000 mg | Freq: Four times a day (QID) | INTRAMUSCULAR | Status: DC
  Administered 2015-09-13 (×2): 0.2 mg via INTRAVENOUS
  Filled 2015-09-13 (×3): qty 1

## 2015-09-13 MED ORDER — LORAZEPAM 2 MG/ML IJ SOLN
0.5000 mg | INTRAMUSCULAR | Status: DC
  Administered 2015-09-13: 0.5 mg via INTRAVENOUS
  Filled 2015-09-13: qty 1

## 2015-09-13 MED ORDER — KETOROLAC TROMETHAMINE 30 MG/ML IJ SOLN
30.0000 mg | Freq: Three times a day (TID) | INTRAMUSCULAR | Status: DC | PRN
  Administered 2015-09-13: 30 mg via INTRAVENOUS
  Filled 2015-09-13: qty 1

## 2015-09-13 MED ORDER — GLYCOPYRROLATE 0.2 MG/ML IJ SOLN: 0.2000 mg | INTRAMUSCULAR | Status: DC | PRN

## 2015-09-13 MED ORDER — MORPHINE BOLUS VIA INFUSION
6.0000 mg | INTRAVENOUS | Status: DC | PRN
  Administered 2015-09-13 (×6): 6 mg via INTRAVENOUS
  Filled 2015-09-13 (×7): qty 6

## 2015-09-13 NOTE — Progress Notes (Signed)
Daily Progress Note   Patient Name: Jesse Murray       Date: 09/13/2015 DOB: September 30, 1960  Age: 55 y.o. MRN#: [id number] Attending Physician: Kinnie Feil, MD Primary Care Physician: No PCP Per Patient Admit Date: 08/22/2015  Reason for Consultation/Follow-up: Non pain symptom management, Pain control, Psychosocial/spiritual support and Terminal Care  Subjective: Pt appears to be actively dying. He is unresponsive. RR 36-40/min. Unable to take anything by mouth. He feels very warm, ? Fever. Upper airway secretions noted. Frequent brief apnea of 10 sec seen. Sister and friend at the bedside. Attempted to prepare them for a prognosis of hours to a day. Interval Events: Clinical decline Length of Stay: 18 days  Current Medications: Scheduled Meds:  . glycopyrrolate  0.2 mg Intravenous 4 times per day  . lip balm   Topical BID  . sodium chloride  10-40 mL Intracatheter Q12H    Continuous Infusions: . morphine 8 mg/hr (09/13/15 1129)    PRN Meds: diphenhydrAMINE, glycopyrrolate, [DISCONTINUED] haloperidol **OR** [DISCONTINUED] haloperidol **OR** haloperidol lactate, ketorolac, LORazepam, morphine, ondansetron (ZOFRAN) IV, polyvinyl alcohol, sodium chloride  Physical Exam: Physical Exam  Constitutional: He appears well-developed.  HENT:  Head: Normocephalic and atraumatic.  Cardiovascular:  Bounding, tachy  Pulmonary/Chest:  tachypnea with RR 36/min Congestion starting  Neurological:  unresponsive  Skin:  Warm, juandiced                Vital Signs: BP 95/62 mmHg  Pulse 109  Temp(Src) 98.9 F (37.2 C) (Oral)  Resp 28  Ht 5\' 10"  (1.778 m)  Wt 90.493 kg (199 lb 8 oz)  BMI 28.63 kg/m2  SpO2 69% SpO2: SpO2: (!) 69 % O2 Device: O2 Device: Not Delivered O2 Flow Rate:  O2 Flow Rate (L/min): 3 L/min  Intake/output summary:  Intake/Output Summary (Last 24 hours) at 09/13/15 1148 Last data filed at 09/12/15 1535  Gross per 24 hour  Intake    931 ml  Output      0 ml  Net    931 ml   LBM: Last BM Date: 09/12/15 Baseline Weight: Weight: 68.04 kg (150 lb) Most recent weight: Weight: 90.493 kg (199 lb 8 oz)       Palliative Assessment/Data: Flowsheet Rows        Most Recent Value   Intake Tab  Referral Department  -- [facility]   Unit at Time of Referral  Med/Surg Unit   Palliative Care Primary Diagnosis  Cancer   Date Notified  09/02/2015   Palliative Care Type  New Palliative care   Reason for referral  Clarify Goals of Care   Date of Admission  09/12/2015   Date first seen by Palliative Care  09/03/2015   # of days Palliative referral response time  0 Day(s)   # of days IP prior to Palliative referral  0   Clinical Assessment    Psychosocial & Spiritual Assessment    Palliative Care Outcomes       Additional Data Reviewed: CBC    Component Value Date/Time   WBC 13.8* 09/12/2015 0427   WBC 11.1* 07/14/2015 1745   RBC 2.85* 09/12/2015 0427   RBC 4.22* 07/14/2015 1745   HGB 8.0* 09/12/2015 0427   HGB 12.3* 07/14/2015 1745   HCT 24.9* 09/12/2015 0427   HCT 36.5* 07/14/2015 1745   PLT 79* 09/12/2015 0427   MCV 87.4 09/12/2015 0427   MCV 86.3 07/14/2015 1745   MCH 28.1 09/12/2015 0427   MCH 29.2 07/14/2015 1745   MCHC 32.1 09/12/2015 0427   MCHC 33.8 07/14/2015 1745   RDW 17.8* 09/12/2015 0427   LYMPHSABS 2.0 09/07/2015 0210   MONOABS 0.7 09/07/2015 0210   EOSABS 0.5 09/07/2015 0210   BASOSABS 0.0 09/07/2015 0210    CMP     Component Value Date/Time   NA 133* 09/11/2015 0424   K 3.5 09/11/2015 0424   CL 104 09/11/2015 0424   CO2 21* 09/11/2015 0424   GLUCOSE 98 09/11/2015 0424   BUN 5* 09/11/2015 0424   CREATININE 0.78 09/11/2015 0424   CREATININE 0.69* 07/14/2015 1732   CALCIUM 7.4* 09/11/2015 0424   PROT 4.8*  09/07/2015 0210   ALBUMIN 1.4* 09/07/2015 0210   AST 55* 09/07/2015 0210   ALT 27 09/07/2015 0210   ALKPHOS 874* 09/07/2015 0210   BILITOT 4.8* 09/07/2015 0210   GFRNONAA >60 09/11/2015 0424   GFRAA >60 09/11/2015 0424       Problem List:  Patient Active Problem List   Diagnosis Date Noted  . Acute CVA (cerebrovascular accident) ([place])   . Weakness generalized   . Delirium due to general medical condition   . Colon cancer ([place])   . Colostomy in place [facility])   . Confusion   . Fever   . Tachypnea   . Ileostomy in place [facility]) 09/02/2015  . Protein-calorie malnutrition, severe ([place]) 09/02/2015  . 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 SBO (small bowel obstruction) from colon cancer s/p colectomy/ileostomy 08/29/2015 09/02/2015  . Bilateral leg edema   . Malignant neoplasm of hepatic flexure ([place])   . Metastatic cancer ([place])   . Metastatic colon cancer to liver ([place]) 09/01/2015  . Leg pain, bilateral   . Absolute anemia   . Acute deep vein thrombosis (DVT) of proximal vein of lower extremity (HCC)   . Palliative care encounter 08/27/2015  . DNR (do not resuscitate) discussion 08/27/2015  . Pain, cancer 08/27/2015  . 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 Abdominal pain 08/16/2015     Palliative Care Assessment & Plan    1.Code Status:  DNR    Code Status Orders        Start     Ordered   09/12/15 2225  Do not attempt resuscitation (DNR)   Continuous    Question Answer Comment  In the event of cardiac or respiratory ARREST Do not call a "code blue"   In the event of cardiac or respiratory ARREST Do not perform Intubation, CPR, defibrillation or ACLS   In the event of cardiac or respiratory ARREST Use medication by any route, position, wound care, and other measures to relive pain and suffering. May use oxygen, suction and manual treatment of airway obstruction as needed for comfort.      09/12/15 2224       2. Goals of  Care/Additional Recommendations:  Pt is actively dying  Anticipate hospital death. Sister, and friend at bedside and are prepared that this is EOL  Limitations on Scope of Treatment: Full Comfort Care  Desire for further Chaplaincy support:no   3. Symptom Management:      1.Pain: Pt is grimacing. Will up titrate ms04 gtt to 8mg /hr and bolus 4 mg q 15 min prn      2. Tachypnea: [place] with opioids. Will up titrate gtt. See above      3. Fever: Add Toradol prn to avoid turning pt for fever      4. Secretions: Will start scheduled robinul and prn      5.Agitation: Cont with ativan prn but will liberalize with q2 prn frequebncy  4. Palliative Prophylaxis:   Delirium Protocol, Frequent Pain Assessment and Oral Care  5. Prognosis: Hours - Days  6. Discharge Planning:  Anticipate hospital death. Pt actively dying   Care plan was discussed with bedside RN, and Dr. Erin Hearing  Thank you for allowing the Palliative Medicine Team to assist in the care of this patient.   Time In: 1130 Time Out: 1155 Total Time 25 min Prolonged Time Billed  no         Dory Horn, NP  09/13/2015, 11:48 AM  Please contact Palliative Medicine Team phone at [phone] for questions and concerns.

## 2015-09-13 NOTE — Discharge Summary (Signed)
Family Medicine Teaching Service Death Summary  Patient name: Jesse Murray Medical record number: [id number] Date of birth: 03/16/1961 Age: 55 y.o. Gender: male Date of Admission: 09/08/2015  Date of Death: 09/27/15 Admitting Physician: Kinnie Feil, MD  Primary Care Provider: No PCP Per Patient Consultants: surgery, palliative care  Indication for Hospitalization: abdominal pain, leg pain and swelling  Discharge Diagnoses/Problem List:  Patient Active Problem List   Diagnosis Date Noted  . Acute CVA (cerebrovascular accident) ([place])   . Weakness generalized   . Delirium due to general medical condition   . Colon cancer ([place])   . Colostomy in place [facility])   . Confusion   . Fever   . Tachypnea   . 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 Protein-calorie malnutrition, severe ([place]) 09/02/2015  . 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 SBO (small bowel obstruction) from colon cancer s/p colectomy/ileostomy 08/29/2015 09/02/2015  . Bilateral leg edema   . Malignant neoplasm of hepatic flexure ([place])   . Metastatic cancer ([place])   . 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 Leg pain, bilateral   . Absolute anemia   . Acute deep vein thrombosis (DVT) of proximal vein of lower extremity (HCC)   . Palliative care encounter 08/27/2015  . DNR (do not resuscitate) discussion 08/27/2015  . Pain, cancer 08/27/2015  . 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 Abdominal pain 2015-09-08   Brief Hospital Course:   Colorectal cancer with extensive metastases Patient presented after two months of worsening abdominal pain, as well as leg pain and swelling. 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 He had Ex Lap partial colectomy, ileostomy & mucous fistula (side by side) on 08/29/15. He was started on TPN that was weaned off on 09/03/15.  Oncology and palliative were consulted to  discuss goal of care. Patient verbalized his desire to continue "fighting". Patient evaluated by PT who recommended SNF for rehabilitation. Patient and family agreeable to this as they desire him to regain his strength to be considered for palliative chemotherapy. However, on 12/31, patient's family opted to make him comfort care, as he began to rapidly decline.  Palliative care continued to provide guidance and make medication adjustments so that patient was allowed to pass in his sleep in peace.  Bilateral LE DVT with PE The patient reported leg pain after three weeks of lying in bed/decreased activity due to a foot injury. His legs were erythematous and edematous, with positive Homan's sign and palpable cords on the R. Given the physical exam, as well as the patient's reported recent inactivity and hypercoagulable state in the setting of presumed malignancy, he was started on heparin drip for DVT thrombolysis. CT abd/pelvis showed bilateral common femoral venous thromboses. CTA showed small PEs as well. Patient's pain and edema eventually improved on heparin gtt. He was later transitioned to Xarelto, which was held later on due to oral bleeding and supratherapeutic INR to 6.77 on 12/25. INR down to 2.15 on 12/29 and Xarelto resumed. However, repeat INR on 12/30 up to 4.38 and Xarelto discontinued again.  Anticoagulation was not restarted, as patient was made comfort care.  Hospital-associated Pneumonia Patient developed fever to 102.70F with associated AMS on day #10 of admission. CXR was suggestive of PNA, so patient was started on vanc and zosyn (09/05/15). Possible GI source given recent surgery. Lactic acid WNL (12/24). WBC improved to 16.7 to 13.8 (12/31).His AMS  resolved quickly after beginning abx. Blood cultures on 12/24 NGTD. Patient defervesced on 12/26. He was transitioned to Flagyl and Levaquin on 12/28.  However, patient quickly began to decline again and he was made comfort care 09/12/2015.  For this reason, all antibiotics were discontinued.  AMS: initially this was thought to be from medication side effects as he was on haldol and ativan. AMS resolved once these medication were held. However, patient sleepy and with significant jaundice on exam on 12/31 suggestive for hepatic encephalopathy. In his final days, patient rapidly declined, and his family quickly transitioned him to comfort care.  Patient was allowed to passed peacefully.  CVA: patient developed left facial drooping and left upper extremity weakness on 12/30 while his INR was supratherapeutic.  MRI was considered but patient was made comfort care and all imaging and blood draws were discontinued.  Significant Procedures: ex lap with partial colectomy, ileostomy, and mucus fistula (12/16), made comfort care 09/12/2015  Significant Labs and Imaging:   Recent Labs Lab 09/11/15 0424 09/11/15 1410 09/12/15 0427  WBC 15.2* 14.3* 13.8*  HGB 8.5* 8.3* 8.0*  HCT 26.6* 25.6* 24.9*  PLT 73* 72* 79*    Recent Labs Lab 09/07/15 0210 09/08/15 0503 09/09/15 0600 09/10/15 0500 09/11/15 0424  NA 127* 136 135 132* 133*  K 3.5 3.5 3.2* 3.5 3.5  CL 98* 106 105 105 104  CO2 20* 22 22 22  21*  GLUCOSE 395* 90 106* 108* 98  BUN <5* <5* 6 6 5*  CREATININE 0.81 0.76 0.73 0.70 0.78  CALCIUM 6.9* 7.3* 7.2* 7.3* 7.4*  ALKPHOS 874*  --   --   --   --   AST 55*  --   --   --   --   ALT 27  --   --   --   --   ALBUMIN 1.4*  --   --   --   --     Janora Norlander, DO 30-Sep-2015, 12:50 AM PGY-1, [place]

## 2015-09-13 NOTE — Progress Notes (Signed)
Family Medicine Teaching Service Daily Progress Note Intern Pager: [phone]  Patient name: Jesse Murray Medical record number: [id number] Date of birth: 21-Jun-1961 Age: 55 y.o. Gender: male  Primary Care Provider: No PCP Per Patient Consultants: surgery, oncology, palliative care Code Status: FULL  Patient overview and Major Events to Date: 12/14 CT showing abdominal mass and SBO 12/16 Ex-lap, partial colectomy, ileostomy, mucus fistula on 08/21/2015 (Dr. 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 12/21: Diet advanced to full; weaning off TPN 12/22: Weaned off TPN; transitioned from heparin to Xarelto 12/24: Developed AMS - Transferred to SDU; febrile with CXR suggestive of HAP, began vanc and zosyn; scheduled pain meds 12/26 AMS resolved; received 2U PRBC 12/30: left facial drooping and LUE weakness 12/31: Patient now comfort care, Morphine gtt started  Assessment and Plan: Jesse Murray is a 55 y.o. male presenting with abdominal pain, as well as bilateral leg swelling and pain now with worsening overall condition. PMH is significant for EtOH use.  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 Ex-lap 12/16 found primary to be most likely pancreatic that is invading hepatic flexure; unresectable but ileostomy was done to help alleviate symptoms.  - Patient made comfort care overnight. - Morphine gtt started w/ as needed morphine - Palliative care continuing to follow pt - called this am.  Hopefully to see today.  CVA: patient with new onset left sided facial drooping and LUE weakness concerning for brains stem stroke. He had low BP to 95/56 on 12/30.  - Comfort care  Fever: Likely HAP (CXR with opacities consistent with PNA). Possible GI source  given recent surgery. Lactic acid WNL (12/24). WBC improved to 16.7 to 13.8 (12/31).  Bcx NGTD (but collected after beginning abx).  Last febrile 12/26. - ABx: Vanc/Zosyn - Day 5 (12/24 >>12/28), Levo/Flagyl 12/28>> to continue for 10 days to cover for potential GI and Respiratory source of infection  Elevated ALP and Bili: likely obstructive biliary flow with tbili 4.1 and direct bili 2.4. AST mildly elevated and ALT WNL. PT, PTT, INR, gamma GT elevated. CT abd showed already known stable appearance of heavy burden of hepatic metastatic disease but no new findings. - labs discontinued  Bilateral DVTs: Femoral thrombosis noted on CTA abdomen and pelvis. CTA chest showed small pulmonary embolus as well as pulmonary edema with small bilateral pleural effusions. Patient transitioned from heparin to Xarelto for long-term anticoagulation on 12/22, however Xarelto held beginning 12/25 given INR 6.77. Xarelto restarted on 12/29 per pharmacy with INR of 2.15. However, it was discontinued on 12/30 with INR of 4.38. INR 3.64 today - xarelto discontinued, all labs discontinued.  Comfort care  Pressure ulcer: stage II. improved. Patient at increased risk due to malnutrition, relative immobility, and increased moisture. Foam island added 12/23.  - Comfort care  Anemia: Hgb 9.7 on admission (12.3 one month ago). Potentially related to malignancy. FOBT negative. Vitals are stable although mildly tachycardic. No bleeding noted or reported overnight. Plt stable. Hgb trended 9.7>>7.0>1uPRBC >9.3>>6.9>2uRBC>9.5>>> 8.0 today. - Comfort care  Thrombocytopenia: 4T score = 2; unlikely HIT. Possibly related to malignancy. No more bleeding today. Platelets decreased from 185-->> 79 but stable over last 24 hours. No more blood in ostomy bag per nursing report. - Comfort care  AMS, worsening: Etiology unknown. Patient with significant jaundice, appears sleepy suggetive  for hepatic encephalopathy. Others on differential are  HAP and mets to brain. CTA showed small PEs which may be contributing.  UA not suggestive of UTI. Continues to be oriented to person, place, time, and situation.  - Comfort care.   - Ativan  AKI: Resolved.  - Foley in place. Consider discontinuation since comfort care.  Hypotension:  Most likely due to volume losses from high ileostomy output and inadequate PO intake (patient off TPN now but not eating and refusing to resume TPN). BP 100s/low 70s overnight.   Social: Patient has agreed to SNF placement with goal of regaining strength to became candidate for palliative chemo.  - Comfort care  FEN/GI:  - SLIV  Prophylaxis: comfort care, xarelto discontinued  Dispo: Comfort care.  Palliative to see  Subjective:  Discussed care with sister.  She notes that he has been better since initiation of morphine gtt.  He appears much more comfortable and less anxious.  Does not feel that she could take care of him at home even with home hospice.  Would prefer to stay inpatient if possible.  Understands that palliative care to come by today.  No additional concerns at this time.  Objective: Temp:  [98.9 F (37.2 C)] 98.9 F (37.2 C) (12/31 1500) Pulse Rate:  [109-125] 109 (01/01 0300) Resp:  [28] 28 (12/31 1500) BP: (95-119)/(62-77) 95/62 mmHg (01/01 0300) SpO2:  [69 %-99 %] 69 % (01/01 0300) Weight:  [199 lb 8 oz (90.493 kg)] 199 lb 8 oz (90.493 kg) (01/01 0300) Physical Exam: General: ill-appearing, cachectic, lying in bed, mouth open, family at bedside.  HEENT: Icteric sclera.  Neuro: sedated Cardiovascular: RRR, no murmurs appreciated Respiratory: air movement decreased left lower lung fields, no wheezes, + labored breathing Abdomen: +BS, soft, non-distended, non-tender, ostomy bag in place and empty. Extremities: 3+ Pitting edema to posterior thighs GU: foley in place with dark urine in bag Skin: Jaundiced all over. Healing incision on abdomen clean, dry, and  intact  Laboratory/Imaging/Diagnostic Tests:  Recent Labs Lab 09/11/15 0424 09/11/15 1410 09/12/15 0427  WBC 15.2* 14.3* 13.8*  HGB 8.5* 8.3* 8.0*  HCT 26.6* 25.6* 24.9*  PLT 73* 72* 79*    Recent Labs Lab 09/06/15 1500 09/07/15 0210  09/09/15 0600 09/10/15 0500 09/11/15 0424  NA  --  127*  < > 135 132* 133*  K  --  3.5  < > 3.2* 3.5 3.5  CL  --  98*  < > 105 105 104  CO2  --  20*  < > 22 22 21*  BUN  --  <5*  < > 6 6 5*  CREATININE  --  0.81  < > 0.73 0.70 0.78  CALCIUM  --  6.9*  < > 7.2* 7.3* 7.4*  PROT  --  4.8*  --   --   --   --   BILITOT 4.1* 4.8*  --   --   --   --   ALKPHOS  --  874*  --   --   --   --   ALT  --  27  --   --   --   --   AST  --  55*  --   --   --   --   GLUCOSE  --  395*  < > 106* 108* 98  < > = values in this interval not displayed.   Janora Norlander, DO 09/13/2015, 8:31 AM PGY-2, [place] Intern  pager: [phone], text pages welcome

## 2015-09-13 NOTE — Progress Notes (Signed)
FPTS Interim Progress Note  S: Paged by RN.  Family voices concern over comfort.  They are distressed by his work of breathing and are wanting the additional 6mg  q15 of Morphine.  They are asking that his infusion be evaluated and possibly increased.    O: BP 95/62 mmHg  Pulse 109  Temp(Src) 100 F (37.8 C) (Oral)  Resp 28  Ht 5\' 10"  (1.778 m)  Wt 199 lb 8 oz (90.493 kg)  BMI 28.63 kg/m2  SpO2 69%  Gen: sedated, agonal breathing Pulm: breathing on RA, but breathing is labored Neuro: sedated, does not follow commands  A/P: KESTUTIS THORMAHLEN is a 55 y.o. that is comfort care in the setting of terminal cancer. - Discussed Morphine dosing with pharmacy.  Per [employer], end of life patients can receive up to 80mg / hour of Morphine to control symptoms.  - Will increase Morphine gtt to 14 mg/hour from 10mg /hr in efforts to make patient more comfortable.  Continue 6 mg q15 as needed to total a max of 38mg /hr at this time. - Discussed care with family at bedside - Will look for Palliative care's recommendations in am.  Janora Norlander, DO 09/13/2015, 9:23 PM PGY-2, [place] Medicine Service pager [phone]

## 2015-09-13 DEATH — deceased

## 2015-09-17 ENCOUNTER — Other Ambulatory Visit: Payer: Self-pay

## 2015-09-17 ENCOUNTER — Inpatient Hospital Stay: Payer: Self-pay | Admitting: Hematology

## 2015-09-18 ENCOUNTER — Ambulatory Visit: Payer: Self-pay | Admitting: Family Medicine

## 2015-10-14 NOTE — Progress Notes (Signed)
Morphine 247mL witnessed and wasted by KOKO and Angelo.

## 2015-10-14 NOTE — Progress Notes (Signed)
Morphine 2mL witnessed and wasted by KOKO, and Jennifer at 2210.

## 2015-10-14 NOTE — Progress Notes (Signed)
At [place] patient was assessed by 2 RN's and was without respirations, absent pulse, unresponsive. Family at bedside. MD notified of patients' current changes in vital signs.

## 2015-10-14 DEATH — deceased

## 2017-04-15 IMAGING — DX DG CHEST 2V
2 series · 2 of 2 positions shown · non-contrast
Comparison: CT Abdomen and Pelvis from today reported separately,
and earlier.

CLINICAL DATA: 54-year-old male with right lower quadrant abdominal
pain and lower extremity swelling since last week. Nausea and
vomiting for 2 weeks. Weakness. Initial encounter.

EXAM:
CHEST  2 VIEW

[chest pa]
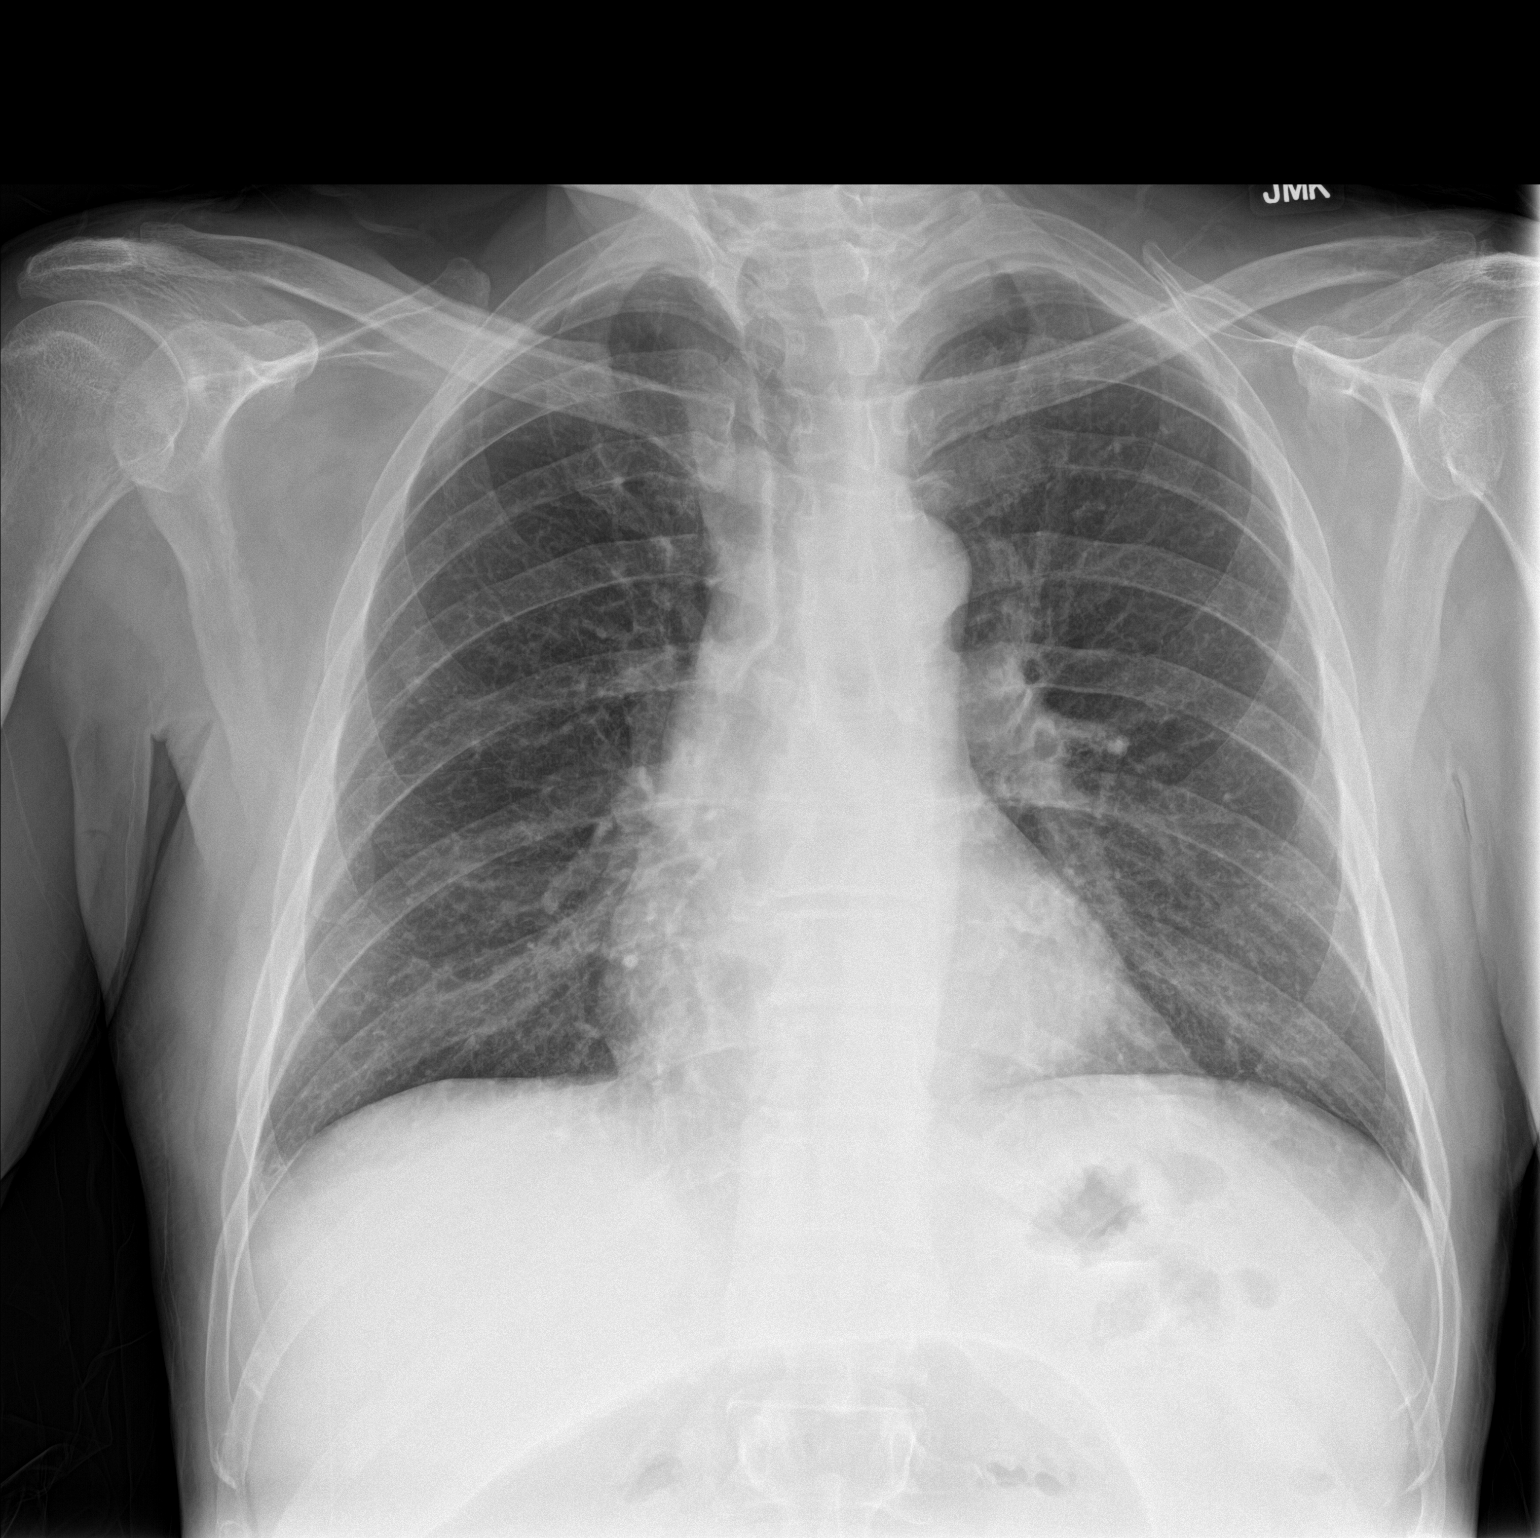

[chest lat]
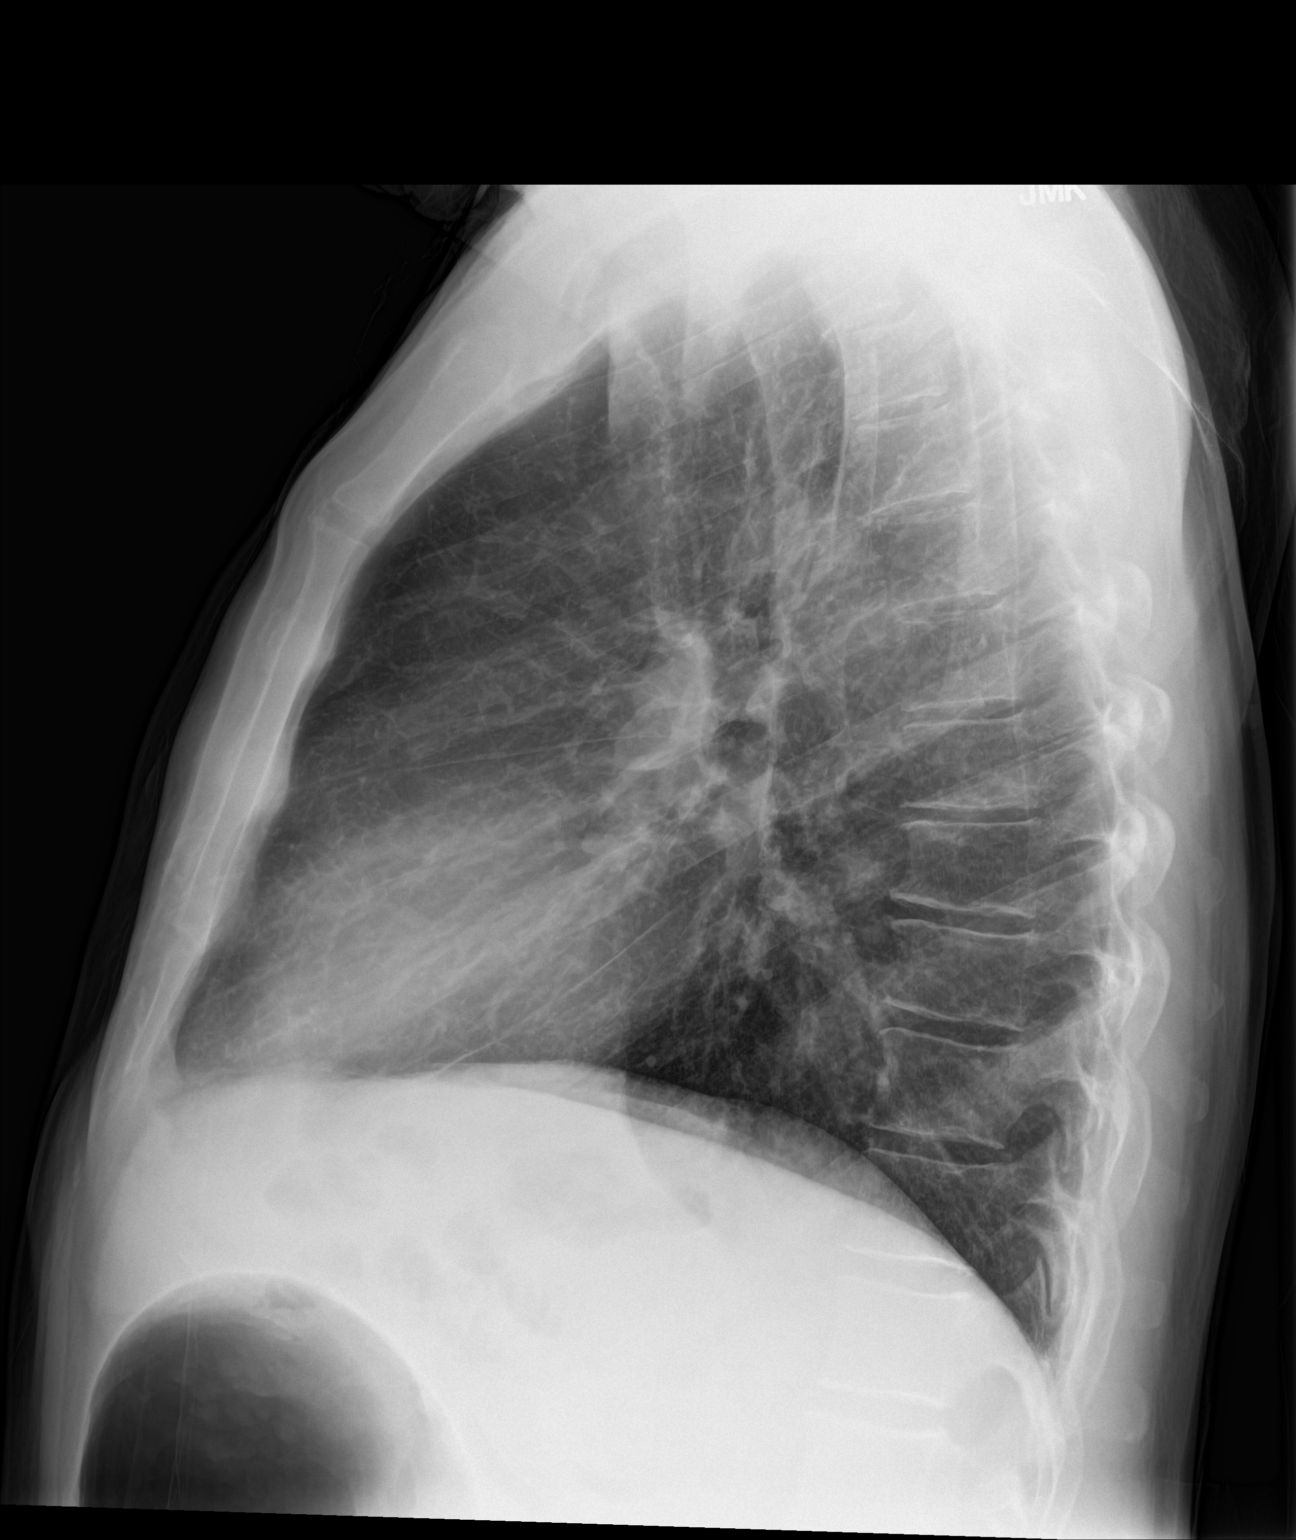

[2 of 2 positions shown; findings below may reference images not displayed]

FINDINGS: Lung volumes are within normal limits. Normal cardiac size and
mediastinal contours. No pneumothorax or pulmonary edema. No pleural
effusion or consolidation. No pulmonary nodule identified. No acute
osseous abnormality identified.

Abnormal bowel gas in the abdomen, see comparison.
IMPRESSION: No acute cardiopulmonary abnormality.

See CT Abdomen and Pelvis from today reported separately.

## 2017-04-15 IMAGING — CT CT ABD-PELV W/ CM
2 of 5 series · 15 of 46 positions shown, 17 images · IV contrast (Omni 300)
Comparison: Radiographs from today and earlier.

ADDENDUM:
Study discussed by telephone with Dr. AKUFUNA KANI on 08/26/2015
at 0077 hours.
CLINICAL DATA: 54-year-old male with abdominal pain for 2 weeks and
lower extremity swelling greater on the right. Unintentional weight
loss for 8 weeks. Abnormal bowel gas pattern today. Initial
encounter.

EXAM:
CT ABDOMEN AND PELVIS WITH CONTRAST
TECHNIQUE: Multidetector CT imaging of the abdomen and pelvis was performed
using the standard protocol following bolus administration of
intravenous contrast.
CONTRAST:  100mL OMNIPAQUE IOHEXOL 300 MG/ML  SOLN

[Series 2: a/p w/ 5mm · axial · 0.68mm/px · z∈[-670,-240]mm · 12 of 100 slices shown, 14 images]
[im 7/100  soft-tissue]
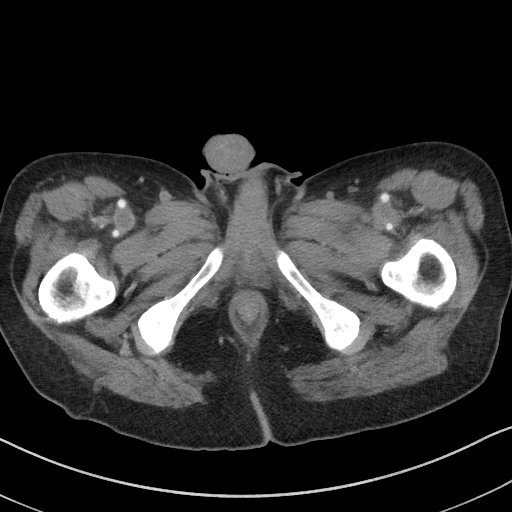
[im 7/100  bone]
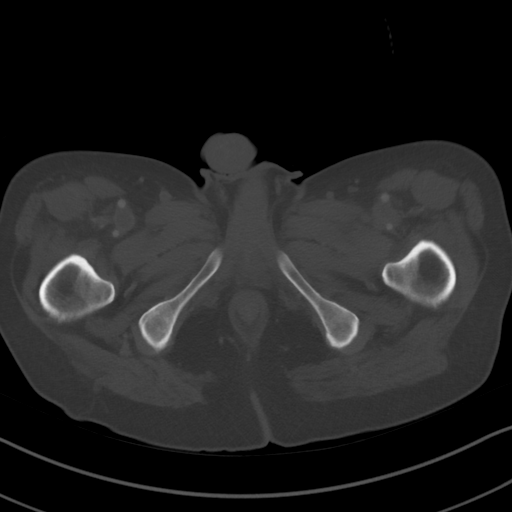
[im 13/100  soft-tissue]
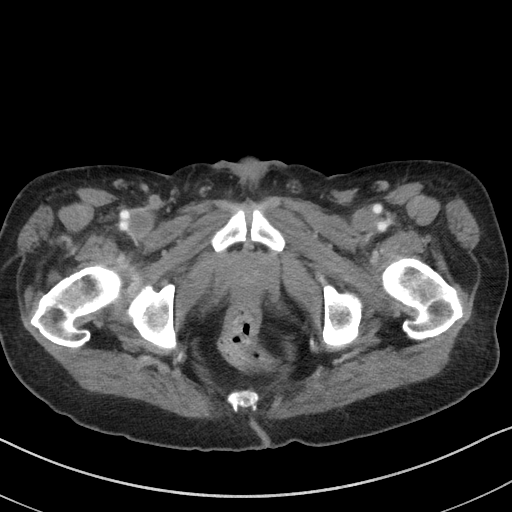
[im 25/100  soft-tissue]
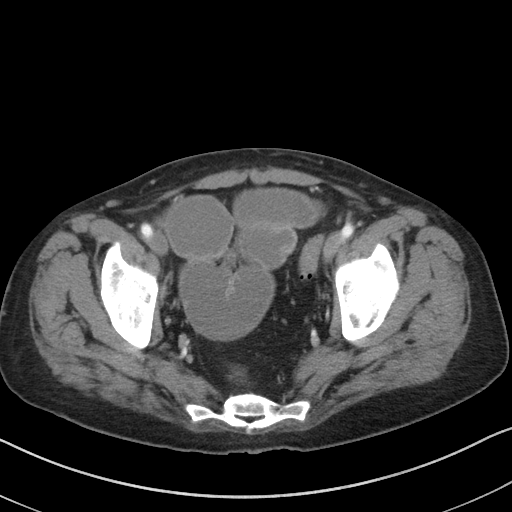
[im 31/100  soft-tissue]
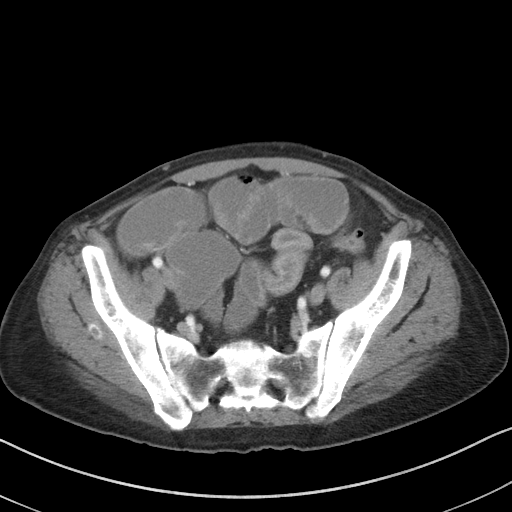
[im 38/100  soft-tissue]
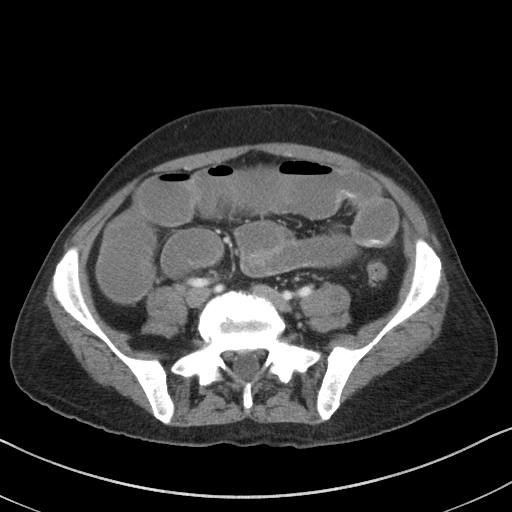
[im 44/100  soft-tissue]
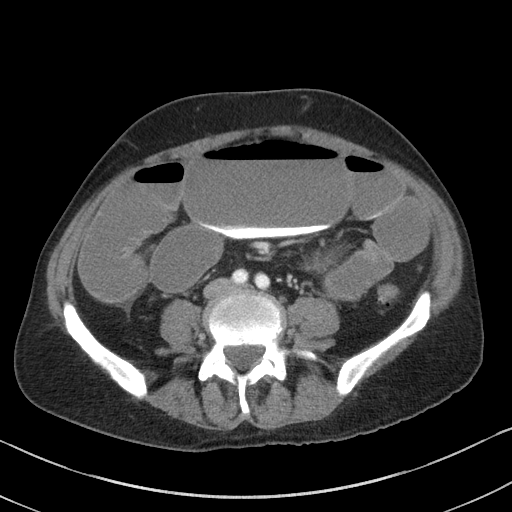
[im 56/100  soft-tissue]
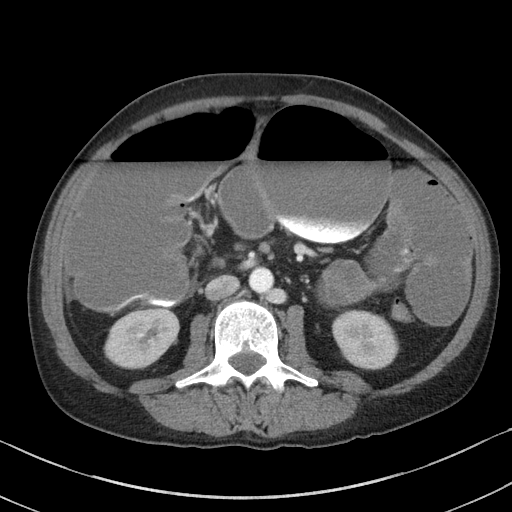
[im 62/100  soft-tissue]
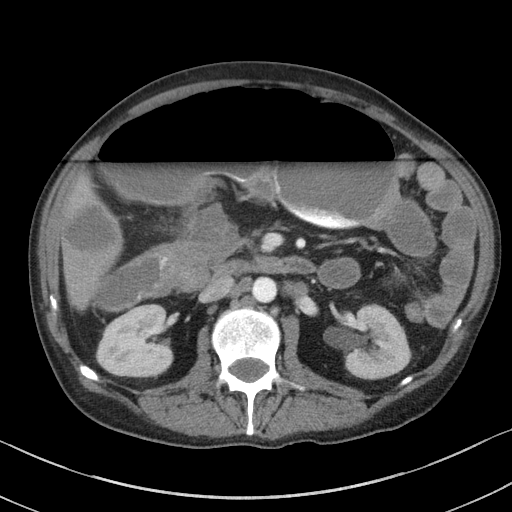
[im 69/100  soft-tissue]
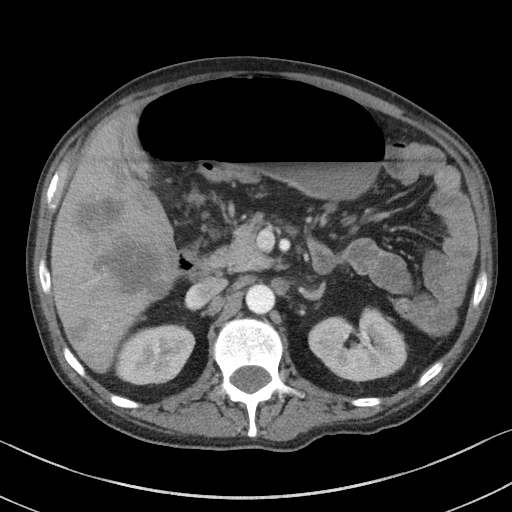
[im 69/100  bone]
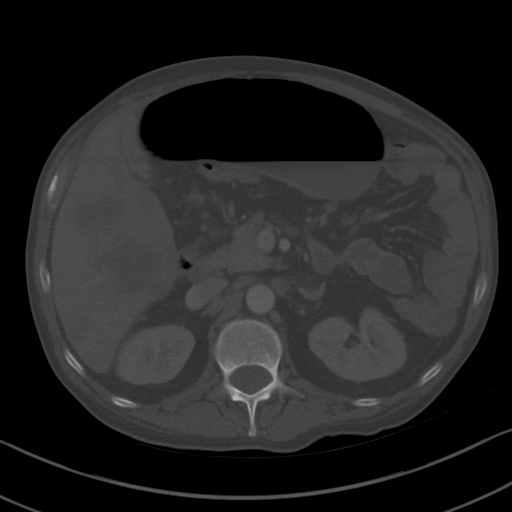
[im 75/100  soft-tissue]
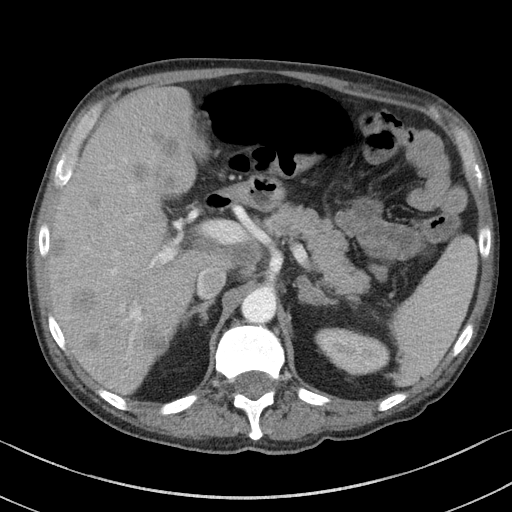
[im 87/100  soft-tissue]
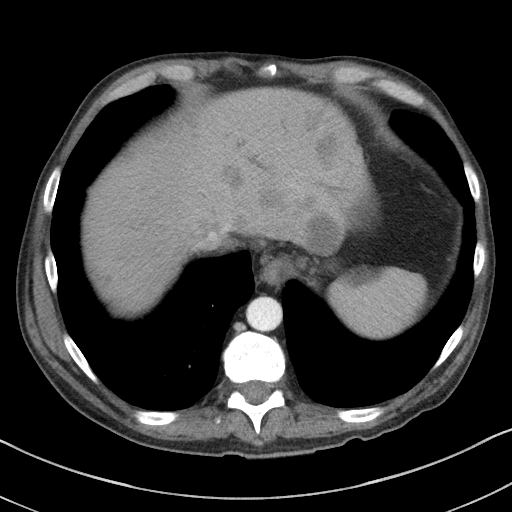
[im 93/100  soft-tissue]
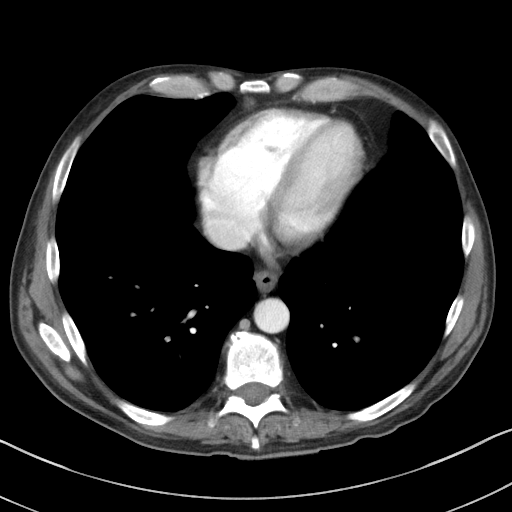

[Series 5: a/p w/ cor · coronal · 0.73mm/px · 3 of 143 slices shown]
[im 48/143  soft-tissue]
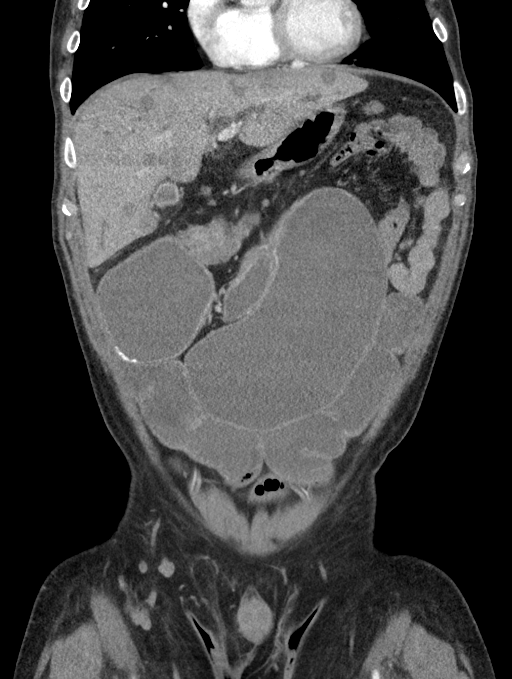
[im 64/143  soft-tissue]
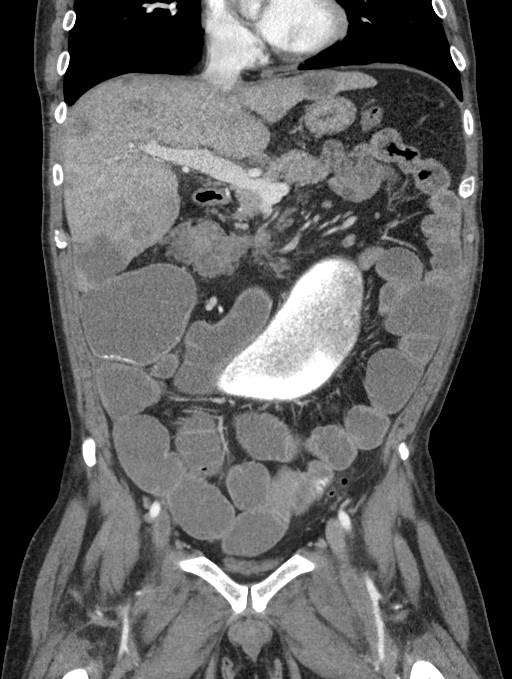
[im 79/143  soft-tissue]
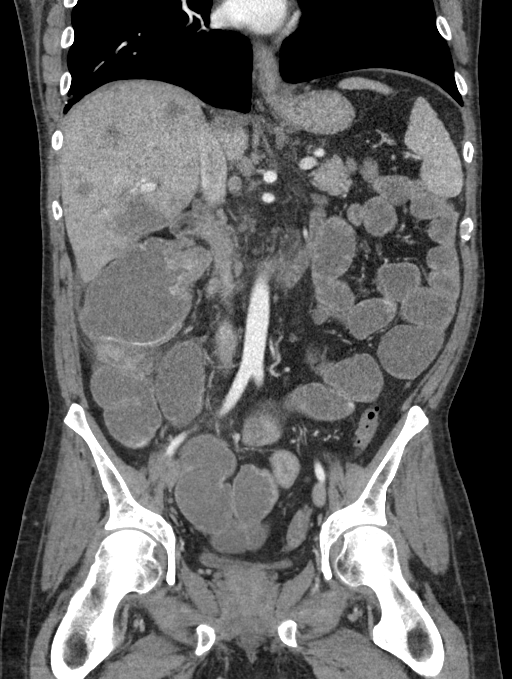

[15 of 46 positions shown; findings below may reference images not displayed]

FINDINGS: There are a small number of subtle lung base pulmonary nodules (left
lung series 3, images 1 and 2). There is a 12 mm more irregular
pulmonary nodule in the right lateral costophrenic angle. No pleural
effusion. No pericardial effusion.

No destructive or suspicious osseous lesion identified. However,
there is a a suspicious small right posterior iliac muscle mass
measuring 11 mm on series 2, image 69.

No abdominal free air. There is trace abdominal free fluid primarily
in the right gutter. There is an obstructing tumor of the colon at
the hepatic flexure with indistinct margins suggesting mesenteric
invasion and adjacent mesenteric nodal disease. The mass encompasses
55 x 66 x 46 mm (AP by transverse by CC). Surrounding irregular
mesenteric nodal disease. Associated ipsilateral and contralateral
retroperitoneal nodal disease, up to 17 mm short axis at the level
of the left main renal vessels. Widespread hepatic metastatic
disease. Hepatic metastases individually up to 5.5 cm diameter.

This cecum is massively distended. The terminal ileum and distal
small bowel are obstructed as expected and measure up to 4-1/2 cm
diameter.

Distal to the obstructing hepatic flexure tumor the colon is
decompressed, with diverticulosis.

Diminutive urinary bladder. Diminutive stomach. The tumor is
inseparable from the second portion of the duodenum on coronal. The
duodenum otherwise appears within normal limits.

There is a small indeterminate 11 mm hypodense lesion in the spleen.
The pancreas is within normal limits. There is a left adrenal
metastasis measuring up to 18 mm diameter by 32 mm in length.

The kidneys are within normal limits. The portal venous system is
patent. Major arterial structures in the abdomen and pelvis are
patent.

There is an indistinct appearance of both common femoral veins in
the pelvis on series 2, image 85. Internal all decreased density at
both. No inguinal lymphadenopathy. The IVC is patent. The common
iliac veins are patent on the delayed images. No pelvic
lymphadenopathy.
IMPRESSION: 1. Sequelae of obstructing Stage IV Colon Cancer at the hepatic
flexure. Extensive hepatic metastases. Extensive nodal metastases.
Left adrenal metastasis.
2. Severely obstructed cecum and distal small bowel. No free air.
Trace free fluid.
3. Bilateral common femoral venous thrombosis suspected. Other major
vascular structures appear patent.
4. Small indeterminate lung base nodules, indeterminate lesions in
the spleen, and right posterior pelvic musculature.
